# Patient Record
Sex: Female | Born: 1995 | Race: White | Hispanic: No | Marital: Married | Smoking: Never smoker
Health system: Southern US, Community
[De-identification: ages and names within clinical notes are randomized; demographics above are authoritative.]

## PROBLEM LIST (undated history)

## (undated) ENCOUNTER — Inpatient Hospital Stay (HOSPITAL_COMMUNITY): Payer: Self-pay

## (undated) DIAGNOSIS — N289 Disorder of kidney and ureter, unspecified: Secondary | ICD-10-CM

## (undated) HISTORY — PX: BLADDER SURGERY: SHX569

---
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
## 2013-02-27 ENCOUNTER — Emergency Department (HOSPITAL_COMMUNITY)
Admission: EM | Admit: 2013-02-27 | Discharge: 2013-02-27 | Disposition: A | Discharge status: Home / Self Care | Payer: Self-pay | Attending: Emergency Medicine | Admitting: Emergency Medicine

## 2013-02-27 ENCOUNTER — Encounter (HOSPITAL_COMMUNITY): Payer: Self-pay | Admitting: Emergency Medicine

## 2013-02-27 DIAGNOSIS — N12 Tubulo-interstitial nephritis, not specified as acute or chronic: Secondary | ICD-10-CM | POA: Insufficient documentation

## 2013-02-27 DIAGNOSIS — Z792 Long term (current) use of antibiotics: Secondary | ICD-10-CM | POA: Insufficient documentation

## 2013-02-27 DIAGNOSIS — Z3202 Encounter for pregnancy test, result negative: Secondary | ICD-10-CM | POA: Insufficient documentation

## 2013-02-27 DIAGNOSIS — Z87440 Personal history of urinary (tract) infections: Secondary | ICD-10-CM | POA: Insufficient documentation

## 2013-02-27 DIAGNOSIS — R1084 Generalized abdominal pain: Secondary | ICD-10-CM | POA: Insufficient documentation

## 2013-02-27 LAB — URINALYSIS, ROUTINE W REFLEX MICROSCOPIC
Bilirubin Urine: NEGATIVE
Glucose, UA: NEGATIVE mg/dL
Ketones, ur: NEGATIVE mg/dL
Nitrite: POSITIVE — AB
Protein, ur: 30 mg/dL — AB
Specific Gravity, Urine: 1.025 (ref 1.005–1.030)
Urobilinogen, UA: 1 mg/dL (ref 0.0–1.0)
pH: 6.5 (ref 5.0–8.0)

## 2013-02-27 LAB — URINE MICROSCOPIC-ADD ON

## 2013-02-27 LAB — PREGNANCY, URINE: Preg Test, Ur: NEGATIVE

## 2013-02-27 MED ORDER — OXYCODONE-ACETAMINOPHEN 5-325 MG PO TABS: 1.0000 | ORAL_TABLET | ORAL | Status: DC | PRN

## 2013-02-27 MED ORDER — SULFAMETHOXAZOLE-TRIMETHOPRIM 800-160 MG PO TABS: 1.0000 | ORAL_TABLET | Freq: Two times a day (BID) | ORAL | Status: DC

## 2013-02-27 MED ORDER — SULFAMETHOXAZOLE-TMP DS 800-160 MG PO TABS
1.0000 | ORAL_TABLET | Freq: Once | ORAL | Status: AC
  Administered 2013-02-27: 1 via ORAL
  Filled 2013-02-27: qty 1

## 2013-02-27 MED ORDER — OXYCODONE-ACETAMINOPHEN 5-325 MG PO TABS
1.0000 | ORAL_TABLET | Freq: Once | ORAL | Status: AC
  Administered 2013-02-27: 1 via ORAL
  Filled 2013-02-27: qty 1

## 2013-02-27 MED ORDER — SULFAMETHOXAZOLE-TRIMETHOPRIM 200-40 MG/5ML PO SUSP: 20.0000 mL | Freq: Once | ORAL | Status: DC

## 2013-02-27 NOTE — Discharge Instructions (Signed)

## 2013-02-27 NOTE — ED Provider Notes (Signed)
CSN: [id number]     Arrival date & time 02/27/13  1229 History  This chart was scribed for Raeford RazorStephen Norfleet Capers, MD by Quintella ReichertMatthew Underwood, ED scribe.  This patient was seen in room APA08/APA08 and the patient's care was started at 1:41 PM.   Chief Complaint  Patient presents with  . Back Pain    The history is provided by the patient. No language interpreter was used.    HPI Comments: Charlotte Smith is a 18 y.o. female who presents to the Emergency Department complaining of lower left-sided back pain that began 2-3 days ago, with associated dysuria.  Pt states her pain came on gradually when she was lying in bed.  She describes pain as sharp.  It is not worsened by anything.  She also complains of 2 days of burning with urination.  She denies fevers, chills, or nausea.  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   Past Medical History  Diagnosis Date  . Renal disorder     non-functioning left kidney    History reviewed. No pertinent past surgical history.  No family history on file.   History  Substance Use Topics  . Smoking status: Never Smoker   . Smokeless tobacco: Not on file  . Alcohol Use: No    OB History   Grav Para Term Preterm Abortions TAB SAB Ect Mult Living                  Review of Systems  All other systems reviewed and are negative.     Allergies  Review of patient's allergies indicates no known allergies.  Home Medications   Current Outpatient Rx  Name  Route  Sig  Dispense  Refill  . amoxicillin (AMOXIL) 500 MG capsule   Oral   Take 1 capsule (500 mg total) by mouth 3 (three) times daily.   30 capsule   0    BP 133/78  Pulse 82  Temp(Src) 97.9 F (36.6 C) (Oral)  Resp 18  Ht 5' 5.5" (1.664 m)  Wt 130 lb (58.968 kg)  BMI 21.30 kg/m2  SpO2 99%  LMP 02/06/2013  Physical Exam  Nursing note and vitals reviewed. Constitutional: She appears well-developed and well-nourished. No distress.   HENT:  Head: Normocephalic and atraumatic.  Eyes: Conjunctivae are normal. Right eye exhibits no discharge. Left eye exhibits no discharge.  Neck: Neck supple.  Cardiovascular: Normal rate, regular rhythm and normal heart sounds.  Exam reveals no gallop and no friction rub.   No murmur heard. Pulmonary/Chest: Effort normal and breath sounds normal. No respiratory distress.  Abdominal: Soft. She exhibits no distension. There is tenderness (mild, diffuse). There is CVA tenderness (left). There is no rebound and no guarding.  Musculoskeletal: She exhibits no edema and no tenderness.  Neurological: She is alert.  Skin: Skin is warm and dry.  Psychiatric: She has a normal mood and affect. Her behavior is normal. Thought content normal.    ED Course  Procedures (including critical care time)  DIAGNOSTIC STUDIES: Oxygen Saturation is 99% on room air, normal by my interpretation.    COORDINATION OF CARE: 1:44 PM-Discussed treatment plan which includes antibiotics and return if symptoms do not improve within 24-36 hours or if she develops new or worsening symptoms.  Pt expressed understanding and agreed with plan.      Labs Review Labs Reviewed  URINALYSIS, ROUTINE W REFLEX MICROSCOPIC - Abnormal; Notable for the  following:    Color, Urine AMBER (*)    APPearance CLOUDY (*)    Hgb urine dipstick SMALL (*)    Protein, ur 30 (*)    Nitrite POSITIVE (*)    Leukocytes, UA TRACE (*)    All other components within normal limits  URINE MICROSCOPIC-ADD ON - Abnormal; Notable for the following:    Bacteria, UA MANY (*)    All other components within normal limits  URINE CULTURE  PREGNANCY, URINE   Imaging Review No results found.  EKG Interpretation   None       MDM   1. Pyelonephritis    18 year old female with atraumatic left lower back pain. Urinary symptoms and urinalysis consistent with urinary tract infection. Sent for culture. Past history significant for single  functioning kidney secondary to vesicoureteral reflux. Patient is afebrile. Well appearing. No vomiting. I feel she is appropriate for outpatient treatment at this time. Strict return precautions were discussed. Outpatient followup otherwise.   I personally preformed the services scribed in my presence. The recorded information has been reviewed is accurate. Raeford Razor, MD.    Raeford Razor, MD 03/02/13 [phone]

## 2013-02-27 NOTE — ED Notes (Signed)
Pt c/o lower back pain that started on right side then moved to left side as well, burning with urination, denies any n/v, chills, has hx of only one kidney functioning

## 2013-03-02 LAB — URINE CULTURE

## 2013-07-02 ENCOUNTER — Encounter (HOSPITAL_COMMUNITY): Payer: Self-pay | Admitting: Emergency Medicine

## 2013-07-02 ENCOUNTER — Emergency Department (HOSPITAL_COMMUNITY)
Admission: EM | Admit: 2013-07-02 | Discharge: 2013-07-03 | Disposition: A | Discharge status: Home / Self Care | Payer: Self-pay | Attending: Emergency Medicine | Admitting: Emergency Medicine

## 2013-07-02 DIAGNOSIS — Z79899 Other long term (current) drug therapy: Secondary | ICD-10-CM | POA: Insufficient documentation

## 2013-07-02 DIAGNOSIS — R110 Nausea: Secondary | ICD-10-CM | POA: Insufficient documentation

## 2013-07-02 DIAGNOSIS — Z87448 Personal history of other diseases of urinary system: Secondary | ICD-10-CM | POA: Insufficient documentation

## 2013-07-02 DIAGNOSIS — Z3202 Encounter for pregnancy test, result negative: Secondary | ICD-10-CM | POA: Insufficient documentation

## 2013-07-02 DIAGNOSIS — R109 Unspecified abdominal pain: Secondary | ICD-10-CM | POA: Insufficient documentation

## 2013-07-02 LAB — POC URINE PREG, ED: Preg Test, Ur: NEGATIVE

## 2013-07-02 NOTE — ED Provider Notes (Signed)
CSN: [id number]     Arrival date & time 07/02/13  2315 History  This chart was scribed for Derwood KaplanAnkit Jizelle Conkey, MD by Shari HeritageAisha Amuda, ED Scribe. The patient was seen in room APA11/APA11. Patient's care was started at 11:54 PM.   Chief Complaint  Patient presents with  . Nausea    The history is provided by the patient. No language interpreter was used.    HPI Comments: Talbert ForestVictoria B McGehee is a 18 y.o. female who presents to the Emergency Department complaining of gradually worsening, intermittent nausea for the past 2 days. She states that she is not experiencing morning sickness and that nausea usually occurs at night. She also reports intermittent cramping in her left abdomen yesterday, but none today. She states that symptoms are unchanged with food intake. She has an appetite and has been able to tolerate food and fluids. She is currently having nausea. She denies associated vomiting, hematuria, dysuria, chest pain. Her LNMP was 05/28/13. She says that her menstrual cycles usually occur monthly and are regular. She states that she is having intercourse without using contraceptives, but she states that she is not trying to get pregnant. She is G0. Patient does not have a PCP.    Past Medical History  Diagnosis Date  . Renal disorder     non-functioning left kidney   Past Surgical History  Procedure Laterality Date  . Bladder surgery     No family history on file. History  Substance Use Topics  . Smoking status: Never Smoker   . Smokeless tobacco: Not on file  . Alcohol Use: No   OB History   Grav Para Term Preterm Abortions TAB SAB Ect Mult Living                 Review of Systems  Constitutional: Negative for fever and chills.  Gastrointestinal: Positive for nausea and abdominal pain. Negative for vomiting.  Genitourinary: Negative for dysuria and hematuria.     Allergies  Review of patient's allergies indicates no known allergies.  Home Medications   Prior to Admission  medications   Medication Sig Start Date End Date Taking? Authorizing Provider  amoxicillin (AMOXIL) 500 MG capsule Take 1 capsule (500 mg total) by mouth 3 (three) times daily. 07/06/12  Yes Donnetta HutchingBrian Cook, MD  oxyCODONE-acetaminophen (PERCOCET/ROXICET) 5-325 MG per tablet Take 1 tablet by mouth every 4 (four) hours as needed for severe pain. 02/27/13  Yes Raeford RazorStephen Kohut, MD  sulfamethoxazole-trimethoprim (SEPTRA DS) 800-160 MG per tablet Take 1 tablet by mouth 2 (two) times daily. 02/27/13  Yes Raeford RazorStephen Kohut, MD  promethazine (PHENERGAN) 25 MG tablet Take 1 tablet (25 mg total) by mouth every 6 (six) hours as needed for nausea. 07/03/13   Derwood KaplanAnkit Latisa Belay, MD   Triage Vitals: BP 129/76  Pulse 89  Temp(Src) 98.5 F (36.9 C) (Oral)  Resp 20  Ht 5\' 5"  (1.651 m)  Wt 130 lb (58.968 kg)  BMI 21.63 kg/m2  SpO2 100%  LMP 06/29/2013 Physical Exam  Nursing note and vitals reviewed. Constitutional: She is oriented to person, place, and time. She appears well-developed and well-nourished. No distress.  HENT:  Head: Normocephalic and atraumatic.  Eyes: Conjunctivae and EOM are normal. No scleral icterus.  Sclera clear bilaterally.  Neck: Normal range of motion. No tracheal deviation present.  Cardiovascular: Normal rate, regular rhythm and normal heart sounds.   No murmur heard. Bilateral pulses normal.  Pulmonary/Chest: Effort normal and breath sounds normal. No respiratory distress. She has no wheezes.  She has no rales.  Lungs clear to auscultation.  Abdominal: Soft. There is no tenderness. There is no rebound and no guarding.  Musculoskeletal: Normal range of motion.  Neurological: She is alert and oriented to person, place, and time.  Skin: Skin is warm and dry.  Psychiatric: She has a normal mood and affect. Her behavior is normal.    ED Course  Procedures (including critical care time) DIAGNOSTIC STUDIES: Oxygen Saturation is 100% on room air, normal by my interpretation.    COORDINATION OF  CARE: 11:59 PM- Patient informed of current plan for treatment and evaluation and agrees with plan at this time.     Labs Review Labs Reviewed  URINALYSIS, ROUTINE W REFLEX MICROSCOPIC - Abnormal; Notable for the following:    Hgb urine dipstick LARGE (*)    Urobilinogen, UA 2.0 (*)    All other components within normal limits  URINE MICROSCOPIC-ADD ON - Abnormal; Notable for the following:    Squamous Epithelial / LPF MANY (*)    Bacteria, UA FEW (*)    All other components within normal limits  HCG, QUANTITATIVE, PREGNANCY  POC URINE PREG, ED    Imaging Review No results found.   EKG Interpretation None      MDM   Final diagnoses:  Nausea alone   I personally performed the services described in this documentation, which was scribed in my presence. The recorded information has been reviewed and is accurate.  Pt with cc of nausea. LMP more than a month ago. Upreg is neg, so is serum preg,. Unsure what is causing her nausea - but no indication for any further emergent workup. Will d.c   Derwood KaplanAnkit Adley Mazurowski, MD 07/03/13 [phone]

## 2013-07-02 NOTE — ED Notes (Signed)
Pt reports nausea for the past 2 to 3 days that is progressively getting worse. Denies any vomiting

## 2013-07-03 LAB — URINE MICROSCOPIC-ADD ON

## 2013-07-03 LAB — URINALYSIS, ROUTINE W REFLEX MICROSCOPIC
BILIRUBIN URINE: NEGATIVE
Glucose, UA: NEGATIVE mg/dL
Ketones, ur: NEGATIVE mg/dL
Leukocytes, UA: NEGATIVE
Nitrite: NEGATIVE
PH: 6 (ref 5.0–8.0)
Protein, ur: NEGATIVE mg/dL
SPECIFIC GRAVITY, URINE: 1.01 (ref 1.005–1.030)
UROBILINOGEN UA: 2 mg/dL — AB (ref 0.0–1.0)

## 2013-07-03 LAB — HCG, QUANTITATIVE, PREGNANCY: hCG, Beta Chain, Quant, S: <1 m[IU]/mL (ref <5)

## 2013-07-03 MED ORDER — ONDANSETRON 4 MG PO TBDP
4.0000 mg | ORAL_TABLET | Freq: Once | ORAL | Status: AC
  Administered 2013-07-03: 4 mg via ORAL
  Filled 2013-07-03: qty 1

## 2013-07-03 MED ORDER — PROMETHAZINE HCL 25 MG PO TABS: 25.0000 mg | ORAL_TABLET | Freq: Four times a day (QID) | ORAL | Status: DC | PRN

## 2013-07-03 NOTE — Discharge Instructions (Signed)
We saw you in the ER for the nausea. All the results in the ER are normal. We are not sure what is causing your symptoms. The workup in the ER is not complete, and is limited to screening for life threatening and emergent conditions only, so please see a primary care doctor for further evaluation.  RESOURCE GUIDE  Chronic Pain Problems: Contact [facility]  [phone] Patients need to be referred by their primary care doctor.  Insufficient Money for Medicine: Contact United Way:  call "211."   No Primary Care Doctor: - Call Health Connect  [phone] - can help you locate a primary care doctor that  accepts your insurance, provides certain services, etc. - Physician Referral Service- [phone]  Agencies that provide inexpensive medical care: - [facility]  [phone] - [facility]  [phone] - [facility]  [phone] - Women's Clinic  [phone] - Planned Parenthood  [phone] [facility]  [phone]  Medicaid-accepting [facility] [place] Providers: - [facility]- [address], [address]  [phone], Mon-Fri 9am-7pm, Sat 9am-1pm - [facility]- [address], [address] [place]  [phone] - [facility]- [address], [address] [place]  [phone] [facility]- [address] [place] [address]  [phone] - Renaye RakersVeita Bland- [address], [address], [phone]  Only accepts [place] patients after they have their name  applied to their card  Self Pay (no insurance) in [place]: - Sickle Cell Patients: Dr Willey BladeEric Dean, [facility]  [address], [phone] - [facility]- [address]  [phone]       Redge Gainer-     [facility]- [address]       -     [facility]- see information above (Speak to [employer]am H if you do not have insurance)       -  [facility] [place]- [address],  [phone]       -  [facility]- [address] [place] [address], [phone]       -  Dr Julio Sickssei-Bonsu-  [address], [address], [place], [phone]       -  [facility] - [address], [phone]       -  [facility]- [address] [place] [address], [phone], also [address], [phone]       -    [facility]- [address], [phone], 1st & 3rd Saturday        every month, 10am-1pm  [facility] [address] [place], [place] [id number] [phone]  [facility] [address] [place], [place] [phone]  1) Find a Doctor and Pay Out of Pocket Although you won't have to find out who is covered by your insurance plan, it is a good idea to ask around and get recommendations. You will then need to call the office and see if the doctor you have chosen will accept you as a new patient and what types of options they offer for patients who are self-pay. Some doctors offer discounts or will set up payment plans for their patients who do not have insurance, but you will need to ask so you aren't surprised when you get to your appointment.  2) Contact Your Local Health Department Not all health departments have doctors that can see patients for sick visits, but many do, so it is worth a call to see if yours does. If you don't know where your local health department is, you can check in your phone book. The CDC also has a tool to help you locate your state's health department, and many state websites also have listings of all of their local health departments.  3) Find a Walk-in Clinic If your illness is not likely to be very severe or complicated, you may want to try a walk in clinic. These are popping up all over the country in pharmacies, drugstores, and shopping centers. They're usually staffed by nurse practitioners or physician assistants that have been trained to treat  common illnesses and complaints. They're usually fairly quick and inexpensive. However, if you have serious medical issues or chronic medical problems, these are probably not your best option  STD Testing - [facility] Department of [facility], STD Clinic, [address], [place], phone [phone] or [phone].  Monday - Friday, call for an appointment. [facility] Department of [employer], STD Clinic, [address], [place], phone [phone] or [phone].  Monday - Friday, call for an appointment.  Abuse/Neglect: [facility] Child Abuse Hotline 782-113-1223 [facility] Child Abuse Hotline [phone] (After Hours)  Emergency Shelter:  [facility] [phone]  Maternity Homes: - [facility] [place] [facility] [phone] - [facility] [phone]  MRSA Hotline #:   (773)515-7093  Dental Assistance If unable to pay or uninsured, contact:  [facility] to become qualified for the adult dental clinic.  Patients with Medicaid: [facility] [phone] [address], [phone] [phone]  If unable to pay, or uninsured, contact [employer] [phone] in [place], [phone] in [facility]) to become qualified for the adult dental clinic  [facility] [address] [place], [place] [address] [phone] [email or web address]  Other Proofreader Services: - [facility]- [address] [place], [place], [place], [address], [phone], Ext. [phone], 2nd and 4th Thursday of the month at 6:30am.  10 clients each day by appointment, can sometimes see walk-in patients if someone does not show for an appointment. [facility]- [address] [place], [place], [address], [phone] - [facility]- [address] [place], [place], [address], [phone] [facility] Health  Department- [phone] [facility] Health Department- [phone] [facility] [phone]         Nausea, Adult Nausea is the feeling that you have an upset stomach or have to vomit. Nausea by itself is not likely a serious concern, but it may be an early sign of more serious medical problems. As nausea gets worse, it can lead to vomiting. If vomiting develops, there is the risk of dehydration.  CAUSES   Viral infections.  Food poisoning.  Medicines.  Pregnancy.  Motion sickness.  Migraine headaches.  Emotional distress.  Severe pain from any source.  Alcohol intoxication. HOME CARE INSTRUCTIONS  Get plenty of rest.  Ask your caregiver about specific rehydration instructions.  Eat small amounts of food and sip liquids more often.  Take all medicines as told by your caregiver. SEEK MEDICAL CARE IF:  You have not improved after 2 days, or you get worse.  You have a  headache. SEEK IMMEDIATE MEDICAL CARE IF:   You have a fever.  You faint.  You keep vomiting or have blood in your vomit.  You are extremely weak or dehydrated.  You have dark or bloody stools.  You have severe chest or abdominal pain. MAKE SURE YOU:  Understand these instructions.  Will watch your condition.  Will get help right away if you are not doing well or get worse. Document Released: 02/14/2004 Document Revised: 10/01/2011 Document Reviewed: 09/18/2010 [facility] Patient Information 2014 [employer] [place].

## 2014-03-03 ENCOUNTER — Emergency Department (HOSPITAL_COMMUNITY)
Admission: EM | Admit: 2014-03-03 | Discharge: 2014-03-03 | Disposition: A | Discharge status: Home / Self Care | Payer: Medicaid Other | Attending: Emergency Medicine | Admitting: Emergency Medicine

## 2014-03-03 ENCOUNTER — Encounter (HOSPITAL_COMMUNITY): Payer: Self-pay

## 2014-03-03 DIAGNOSIS — R0600 Dyspnea, unspecified: Secondary | ICD-10-CM

## 2014-03-03 DIAGNOSIS — O9989 Other specified diseases and conditions complicating pregnancy, childbirth and the puerperium: Secondary | ICD-10-CM | POA: Insufficient documentation

## 2014-03-03 DIAGNOSIS — Z3490 Encounter for supervision of normal pregnancy, unspecified, unspecified trimester: Secondary | ICD-10-CM

## 2014-03-03 DIAGNOSIS — Z792 Long term (current) use of antibiotics: Secondary | ICD-10-CM | POA: Diagnosis not present

## 2014-03-03 DIAGNOSIS — Z3A00 Weeks of gestation of pregnancy not specified: Secondary | ICD-10-CM | POA: Insufficient documentation

## 2014-03-03 DIAGNOSIS — Z87448 Personal history of other diseases of urinary system: Secondary | ICD-10-CM | POA: Diagnosis not present

## 2014-03-03 LAB — URINALYSIS, ROUTINE W REFLEX MICROSCOPIC
BILIRUBIN URINE: NEGATIVE
Glucose, UA: NEGATIVE mg/dL
KETONES UR: NEGATIVE mg/dL
Leukocytes, UA: NEGATIVE
NITRITE: NEGATIVE
Protein, ur: NEGATIVE mg/dL
SPECIFIC GRAVITY, URINE: 1.01 (ref 1.005–1.030)
Urobilinogen, UA: 0.2 mg/dL (ref 0.0–1.0)
pH: 6 (ref 5.0–8.0)

## 2014-03-03 LAB — URINE MICROSCOPIC-ADD ON

## 2014-03-03 LAB — PREGNANCY, URINE: Preg Test, Ur: POSITIVE — AB

## 2014-03-03 NOTE — ED Notes (Signed)
Patient verbalizes understanding of discharge instructions,home care and follow up care. Patient ambulatory out of department at this time with spouse. 

## 2014-03-03 NOTE — ED Provider Notes (Signed)
CSN: [id number]     Arrival date & time 03/03/14  0145 History   First MD Initiated Contact with Patient 03/03/14 0200     Chief Complaint  Patient presents with  . Shortness of Breath     (Consider location/radiation/quality/duration/timing/severity/associated sxs/prior Treatment) HPI Comments: Patient is a 19 year old female who presents with complaints of shortness of breath. She states that this started this morning when she woke up. She feels as though she cannot catch her breath. She denies wheezing. She denies chest pain, cough, fevers, sputum production. She denies any recent exertional symptoms. She denies any swelling or pain in her legs.  Patient is a 19 y.o. female presenting with shortness of breath. The history is provided by the patient.  Shortness of Breath Severity:  Moderate Onset quality:  Sudden Duration:  1 day Timing:  Constant Progression:  Unchanged Chronicity:  New Relieved by:  Nothing Worsened by:  Nothing tried Ineffective treatments:  None tried Associated symptoms: no chest pain, no cough, no fever, no sore throat and no sputum production     Past Medical History  Diagnosis Date  . Renal disorder     non-functioning left kidney   Past Surgical History  Procedure Laterality Date  . Bladder surgery     No family history on file. History  Substance Use Topics  . Smoking status: Never Smoker   . Smokeless tobacco: Not on file  . Alcohol Use: No   OB History    No data available     Review of Systems  Constitutional: Negative for fever.  HENT: Negative for sore throat.   Respiratory: Positive for shortness of breath. Negative for cough and sputum production.   Cardiovascular: Negative for chest pain.  All other systems reviewed and are negative.     Allergies  Review of patient's allergies indicates no known allergies.  Home Medications   Prior to Admission medications   Medication Sig Start Date End Date Taking? Authorizing  Provider  amoxicillin (AMOXIL) 500 MG capsule Take 1 capsule (500 mg total) by mouth 3 (three) times daily. 07/06/12   Donnetta Hutching, MD  oxyCODONE-acetaminophen (PERCOCET/ROXICET) 5-325 MG per tablet Take 1 tablet by mouth every 4 (four) hours as needed for severe pain. 02/27/13   Raeford Razor, MD  promethazine (PHENERGAN) 25 MG tablet Take 1 tablet (25 mg total) by mouth every 6 (six) hours as needed for nausea. 07/03/13   Derwood Kaplan, MD  sulfamethoxazole-trimethoprim (SEPTRA DS) 800-160 MG per tablet Take 1 tablet by mouth 2 (two) times daily. 02/27/13   Raeford Razor, MD   BP 136/89 mmHg  Pulse 109  Temp(Src) 98.3 F (36.8 C) (Oral)  Resp 16  Ht 5' 5.5" (1.664 m)  Wt 140 lb (63.504 kg)  BMI 22.93 kg/m2  SpO2 100%  LMP 01/29/2014 Physical Exam  Constitutional: She is oriented to person, place, and time. She appears well-developed and well-nourished. No distress.  HENT:  Head: Normocephalic and atraumatic.  Neck: Normal range of motion. Neck supple.  Cardiovascular: Normal rate and regular rhythm.  Exam reveals no gallop and no friction rub.   No murmur heard. Pulmonary/Chest: Effort normal and breath sounds normal. No respiratory distress. She has no wheezes. She has no rales.  Abdominal: Soft. Bowel sounds are normal. She exhibits no distension. There is no tenderness.  Musculoskeletal: Normal range of motion. She exhibits no edema.  There is no swelling in the legs. There is no calf tenderness. Homans sign is absent bilaterally.  Neurological:  She is alert and oriented to person, place, and time.  Skin: Skin is warm and dry. She is not diaphoretic.  Nursing note and vitals reviewed.   ED Course  Procedures (including critical care time) Labs Review Labs Reviewed  PREGNANCY, URINE  URINALYSIS, ROUTINE W REFLEX MICROSCOPIC    Imaging Review No results found.   EKG Interpretation None      MDM   Final diagnoses:  None    Patient presents with complaints of  shortness of breath that started yesterday morning when she woke up. Her oxygen saturations are 100%, respiratory rate is 16, and her lungs are clear. While speaking with her, she informed me that she is late for her period and had a home pregnancy test which was positive. She would like to have a pregnancy test to confirm the results from home. This returned positive as well. She will be discharged with follow-up with OB. She appears in no respiratory distress and I do not feel as though further workup into her initially reported dyspnea is indicated. She is to return as needed if she worsens.    Geoffery Lyonsouglas Korey Arroyo, MD 03/03/14 [phone]

## 2014-03-03 NOTE — Discharge Instructions (Signed)
You need to follow-up with an OB/GYN to establish prenatal care. The contact information for Dr. Emelda FearFerguson has been provided in this discharge summary. You can call his office to arrange this appointment.  Return to the emergency department if your symptoms substantially worsen or change.   Shortness of Breath Shortness of breath means you have trouble breathing. It could also mean that you have a medical problem. You should get immediate medical care for shortness of breath. CAUSES   Not enough oxygen in the air such as with high altitudes or a smoke-filled room.  Certain lung diseases, infections, or problems.  Heart disease or conditions, such as angina or heart failure.  Low red blood cells (anemia).  Poor physical fitness, which can cause shortness of breath when you exercise.  Chest or back injuries or stiffness.  Being overweight.  Smoking.  Anxiety, which can make you feel like you are not getting enough air. DIAGNOSIS  Serious medical problems can often be found during your physical exam. Tests may also be done to determine why you are having shortness of breath. Tests may include:  Chest X-rays.  Lung function tests.  Blood tests.  An electrocardiogram (ECG).  An ambulatory electrocardiogram. An ambulatory ECG records your heartbeat patterns over a 24-hour period.  Exercise testing.  A transthoracic echocardiogram (TTE). During echocardiography, sound waves are used to evaluate how blood flows through your heart.  A transesophageal echocardiogram (TEE).  Imaging scans. Your health care provider may not be able to find a cause for your shortness of breath after your exam. In this case, it is important to have a follow-up exam with your health care provider as directed.  TREATMENT  Treatment for shortness of breath depends on the cause of your symptoms and can vary greatly. HOME CARE INSTRUCTIONS   Do not smoke. Smoking is a common cause of shortness of  breath. If you smoke, ask for help to quit.  Avoid being around chemicals or things that may bother your breathing, such as paint fumes and dust.  Rest as needed. Slowly resume your usual activities.  If medicines were prescribed, take them as directed for the full length of time directed. This includes oxygen and any inhaled medicines.  Keep all follow-up appointments as directed by your health care provider. SEEK MEDICAL CARE IF:   Your condition does not improve in the time expected.  You have a hard time doing your normal activities even with rest.  You have any new symptoms. SEEK IMMEDIATE MEDICAL CARE IF:   Your shortness of breath gets worse.  You feel light-headed, faint, or develop a cough not controlled with medicines.  You start coughing up blood.  You have pain with breathing.  You have chest pain or pain in your arms, shoulders, or abdomen.  You have a fever.  You are unable to walk up stairs or exercise the way you normally do. MAKE SURE YOU:  Understand these instructions.  Will watch your condition.  Will get help right away if you are not doing well or get worse. Document Released: 10/01/2000 Document Revised: 01/11/2013 Document Reviewed: 03/24/2011 [facility] Patient Information 2015 [place]. This information is not intended to replace advice given to you by your health care provider. Make sure you discuss any questions you have with your health care provider.  First Trimester of Pregnancy The first trimester of pregnancy is from week 1 until the end of week 12 (months 1 through 3). A week after a sperm fertilizes  an egg, the egg will implant on the wall of the uterus. This embryo will begin to develop into a baby. Genes from you and your partner are forming the baby. The female genes determine whether the baby is a boy or a girl. At 6-8 weeks, the eyes and face are formed, and the heartbeat can be seen on ultrasound. At the end of 12 weeks, all the  baby's organs are formed.  Now that you are pregnant, you will want to do everything you can to have a healthy baby. Two of the most important things are to get good prenatal care and to follow your health care provider's instructions. Prenatal care is all the medical care you receive before the baby's birth. This care will help prevent, find, and treat any problems during the pregnancy and childbirth. BODY CHANGES Your body goes through many changes during pregnancy. The changes vary from woman to woman.   You may gain or lose a couple of pounds at first.  You may feel sick to your stomach (nauseous) and throw up (vomit). If the vomiting is uncontrollable, call your health care provider.  You may tire easily.  You may develop headaches that can be relieved by medicines approved by your health care provider.  You may urinate more often. Painful urination may mean you have a bladder infection.  You may develop heartburn as a result of your pregnancy.  You may develop constipation because certain hormones are causing the muscles that push waste through your intestines to slow down.  You may develop hemorrhoids or swollen, bulging veins (varicose veins).  Your breasts may begin to grow larger and become tender. Your nipples may stick out more, and the tissue that surrounds them (areola) may become darker.  Your gums may bleed and may be sensitive to brushing and flossing.  Dark spots or blotches (chloasma, mask of pregnancy) may develop on your face. This will likely fade after the baby is born.  Your menstrual periods will stop.  You may have a loss of appetite.  You may develop cravings for certain kinds of food.  You may have changes in your emotions from day to day, such as being excited to be pregnant or being concerned that something may go wrong with the pregnancy and baby.  You may have more vivid and strange dreams.  You may have changes in your hair. These can include  thickening of your hair, rapid growth, and changes in texture. Some women also have hair loss during or after pregnancy, or hair that feels dry or thin. Your hair will most likely return to normal after your baby is born. WHAT TO EXPECT AT YOUR PRENATAL VISITS During a routine prenatal visit:  You will be weighed to make sure you and the baby are growing normally.  Your blood pressure will be taken.  Your abdomen will be measured to track your baby's growth.  The fetal heartbeat will be listened to starting around week 10 or 12 of your pregnancy.  Test results from any previous visits will be discussed. Your health care provider may ask you:  How you are feeling.  If you are feeling the baby move.  If you have had any abnormal symptoms, such as leaking fluid, bleeding, severe headaches, or abdominal cramping.  If you have any questions. Other tests that may be performed during your first trimester include:  Blood tests to find your blood type and to check for the presence of any previous infections. They  will also be used to check for low iron levels (anemia) and Rh antibodies. Later in the pregnancy, blood tests for diabetes will be done along with other tests if problems develop.  Urine tests to check for infections, diabetes, or protein in the urine.  An ultrasound to confirm the proper growth and development of the baby.  An amniocentesis to check for possible genetic problems.  Fetal screens for spina bifida and Down syndrome.  You may need other tests to make sure you and the baby are doing well. HOME CARE INSTRUCTIONS  Medicines  Follow your health care provider's instructions regarding medicine use. Specific medicines may be either safe or unsafe to take during pregnancy.  Take your prenatal vitamins as directed.  If you develop constipation, try taking a stool softener if your health care provider approves. Diet  Eat regular, well-balanced meals. Choose a variety  of foods, such as meat or vegetable-based protein, fish, milk and low-fat dairy products, vegetables, fruits, and whole grain breads and cereals. Your health care provider will help you determine the amount of weight gain that is right for you.  Avoid raw meat and uncooked cheese. These carry germs that can cause birth defects in the baby.  Eating four or five small meals rather than three large meals a day may help relieve nausea and vomiting. If you start to feel nauseous, eating a few soda crackers can be helpful. Drinking liquids between meals instead of during meals also seems to help nausea and vomiting.  If you develop constipation, eat more high-fiber foods, such as fresh vegetables or fruit and whole grains. Drink enough fluids to keep your urine clear or pale yellow. Activity and Exercise  Exercise only as directed by your health care provider. Exercising will help you:  Control your weight.  Stay in shape.  Be prepared for labor and delivery.  Experiencing pain or cramping in the lower abdomen or low back is a good sign that you should stop exercising. Check with your health care provider before continuing normal exercises.  Try to avoid standing for long periods of time. Move your legs often if you must stand in one place for a long time.  Avoid heavy lifting.  Wear low-heeled shoes, and practice good posture.  You may continue to have sex unless your health care provider directs you otherwise. Relief of Pain or Discomfort  Wear a good support bra for breast tenderness.   Take warm sitz baths to soothe any pain or discomfort caused by hemorrhoids. Use hemorrhoid cream if your health care provider approves.   Rest with your legs elevated if you have leg cramps or low back pain.  If you develop varicose veins in your legs, wear support hose. Elevate your feet for 15 minutes, 3-4 times a day. Limit salt in your diet. Prenatal Care  Schedule your prenatal visits by the  twelfth week of pregnancy. They are usually scheduled monthly at first, then more often in the last 2 months before delivery.  Write down your questions. Take them to your prenatal visits.  Keep all your prenatal visits as directed by your health care provider. Safety  Wear your seat belt at all times when driving.  Make a list of emergency phone numbers, including numbers for family, friends, the hospital, and police and fire departments. General Tips  Ask your health care provider for a referral to a local prenatal education class. Begin classes no later than at the beginning of month 6 of your  pregnancy.  Ask for help if you have counseling or nutritional needs during pregnancy. Your health care provider can offer advice or refer you to specialists for help with various needs.  Do not use hot tubs, steam rooms, or saunas.  Do not douche or use tampons or scented sanitary pads.  Do not cross your legs for long periods of time.  Avoid cat litter boxes and soil used by cats. These carry germs that can cause birth defects in the baby and possibly loss of the fetus by miscarriage or stillbirth.  Avoid all smoking, herbs, alcohol, and medicines not prescribed by your health care provider. Chemicals in these affect the formation and growth of the baby.  Schedule a dentist appointment. At home, brush your teeth with a soft toothbrush and be gentle when you floss. SEEK MEDICAL CARE IF:   You have dizziness.  You have mild pelvic cramps, pelvic pressure, or nagging pain in the abdominal area.  You have persistent nausea, vomiting, or diarrhea.  You have a bad smelling vaginal discharge.  You have pain with urination.  You notice increased swelling in your face, hands, legs, or ankles. SEEK IMMEDIATE MEDICAL CARE IF:   You have a fever.  You are leaking fluid from your vagina.  You have spotting or bleeding from your vagina.  You have severe abdominal cramping or pain.  You  have rapid weight gain or loss.  You vomit blood or material that looks like coffee grounds.  You are exposed to [place] measles and have never had them.  You are exposed to fifth disease or chickenpox.  You develop a severe headache.  You have shortness of breath.  You have any kind of trauma, such as from a fall or a car accident. Document Released: 12/31/2000 Document Revised: 05/23/2013 Document Reviewed: 11/16/2012 [facility] Patient Information 2015 [place], [place]. This information is not intended to replace advice given to you by your health care provider. Make sure you discuss any questions you have with your health care provider.

## 2014-03-03 NOTE — ED Notes (Signed)
Patient states she began to have shortness of breath that started yesterday at 0900. Patient denies chest pain and denies cough. Patient states she feels like she cannot catch her breath.

## 2014-03-12 ENCOUNTER — Encounter (HOSPITAL_COMMUNITY): Payer: Self-pay

## 2014-03-12 ENCOUNTER — Emergency Department (HOSPITAL_COMMUNITY)
Admission: EM | Admit: 2014-03-12 | Discharge: 2014-03-12 | Disposition: A | Discharge status: Home / Self Care | Payer: Medicaid Other | Attending: Emergency Medicine | Admitting: Emergency Medicine

## 2014-03-12 DIAGNOSIS — K219 Gastro-esophageal reflux disease without esophagitis: Secondary | ICD-10-CM | POA: Insufficient documentation

## 2014-03-12 DIAGNOSIS — Z87448 Personal history of other diseases of urinary system: Secondary | ICD-10-CM | POA: Diagnosis not present

## 2014-03-12 DIAGNOSIS — Z3A01 Less than 8 weeks gestation of pregnancy: Secondary | ICD-10-CM | POA: Insufficient documentation

## 2014-03-12 DIAGNOSIS — O210 Mild hyperemesis gravidarum: Secondary | ICD-10-CM

## 2014-03-12 DIAGNOSIS — O99611 Diseases of the digestive system complicating pregnancy, first trimester: Secondary | ICD-10-CM | POA: Insufficient documentation

## 2014-03-12 LAB — URINALYSIS, ROUTINE W REFLEX MICROSCOPIC
Bilirubin Urine: NEGATIVE
GLUCOSE, UA: NEGATIVE mg/dL
Ketones, ur: 40 mg/dL — AB
Leukocytes, UA: NEGATIVE
Nitrite: POSITIVE — AB
PROTEIN: NEGATIVE mg/dL
SPECIFIC GRAVITY, URINE: 1.01 (ref 1.005–1.030)
UROBILINOGEN UA: 0.2 mg/dL (ref 0.0–1.0)
pH: 6 (ref 5.0–8.0)

## 2014-03-12 LAB — CBC WITH DIFFERENTIAL/PLATELET
BASOS PCT: 0 % (ref 0–1)
Basophils Absolute: 0 10*3/uL (ref 0.0–0.1)
Eosinophils Absolute: 0.2 10*3/uL (ref 0.0–0.7)
Eosinophils Relative: 2 % (ref 0–5)
HCT: 36.8 % (ref 36.0–46.0)
Hemoglobin: 12.8 g/dL (ref 12.0–15.0)
LYMPHS PCT: 15 % (ref 12–46)
Lymphs Abs: 1.6 10*3/uL (ref 0.7–4.0)
MCH: 29.6 pg (ref 26.0–34.0)
MCHC: 34.8 g/dL (ref 30.0–36.0)
MCV: 85.2 fL (ref 78.0–100.0)
MONO ABS: 0.6 10*3/uL (ref 0.1–1.0)
MONOS PCT: 5 % (ref 3–12)
NEUTROS PCT: 78 % — AB (ref 43–77)
Neutro Abs: 8.6 10*3/uL — ABNORMAL HIGH (ref 1.7–7.7)
Platelets: 246 10*3/uL (ref 150–400)
RBC: 4.32 MIL/uL (ref 3.87–5.11)
RDW: 12 % (ref 11.5–15.5)
WBC: 11.1 10*3/uL — ABNORMAL HIGH (ref 4.0–10.5)

## 2014-03-12 LAB — LIPASE, BLOOD: LIPASE: 23 U/L (ref 11–59)

## 2014-03-12 LAB — COMPREHENSIVE METABOLIC PANEL
ALT: 14 U/L (ref 0–35)
AST: 16 U/L (ref 0–37)
Albumin: 4.8 g/dL (ref 3.5–5.2)
Alkaline Phosphatase: 40 U/L (ref 39–117)
Anion gap: 7 (ref 5–15)
BILIRUBIN TOTAL: 0.8 mg/dL (ref 0.3–1.2)
BUN: 13 mg/dL (ref 6–23)
CHLORIDE: 103 mmol/L (ref 96–112)
CO2: 25 mmol/L (ref 19–32)
Calcium: 9.4 mg/dL (ref 8.4–10.5)
Creatinine, Ser: 0.73 mg/dL (ref 0.50–1.10)
GFR calc Af Amer: >90 mL/min (ref >90)
GLUCOSE: 88 mg/dL (ref 70–99)
POTASSIUM: 3.4 mmol/L — AB (ref 3.5–5.1)
Sodium: 135 mmol/L (ref 135–145)
TOTAL PROTEIN: 7.8 g/dL (ref 6.0–8.3)

## 2014-03-12 LAB — URINE MICROSCOPIC-ADD ON

## 2014-03-12 LAB — HCG, QUANTITATIVE, PREGNANCY: hCG, Beta Chain, Quant, S: 74373 m[IU]/mL — ABNORMAL HIGH (ref <5)

## 2014-03-12 MED ORDER — SODIUM CHLORIDE 0.9 % IV BOLUS (SEPSIS)
1000.0000 mL | Freq: Once | INTRAVENOUS | Status: AC
  Administered 2014-03-12: 1000 mL via INTRAVENOUS

## 2014-03-12 MED ORDER — ONDANSETRON HCL 4 MG PO TABS: 4.0000 mg | ORAL_TABLET | Freq: Four times a day (QID) | ORAL | Status: DC

## 2014-03-12 MED ORDER — CEPHALEXIN 500 MG PO CAPS
500.0000 mg | ORAL_CAPSULE | Freq: Once | ORAL | Status: AC
  Administered 2014-03-12: 500 mg via ORAL
  Filled 2014-03-12: qty 1

## 2014-03-12 MED ORDER — RANITIDINE HCL 150 MG PO TABS: 150.0000 mg | ORAL_TABLET | Freq: Two times a day (BID) | ORAL | Status: DC

## 2014-03-12 MED ORDER — CEPHALEXIN 500 MG PO CAPS: 500.0000 mg | ORAL_CAPSULE | Freq: Four times a day (QID) | ORAL | Status: DC

## 2014-03-12 MED ORDER — ONDANSETRON HCL 4 MG/2ML IJ SOLN
4.0000 mg | Freq: Once | INTRAMUSCULAR | Status: AC
  Administered 2014-03-12: 4 mg via INTRAVENOUS
  Filled 2014-03-12: qty 2

## 2014-03-12 NOTE — ED Provider Notes (Signed)
CSN: [id number]     Arrival date & time 03/12/14  1953 History   First MD Initiated Contact with Patient 03/12/14 2001     Chief Complaint  Patient presents with  . Gastrophageal Reflux     (Consider location/radiation/quality/duration/timing/severity/associated sxs/prior Treatment) HPI Comments: Patient presents to the ER for evaluation of reflux with nausea. Patient reports that she was recently diagnosed with early pregnancy, she has approximate 5 or 6 weeks. She reports that for most of this week she has been having belching, discomfort in her abdomen and chest that she associates with reflux and nausea. She has not had any vomiting. There is no fever. She denies pelvic pain, vaginal bleeding, vaginal discharge. She has follow-up with Dr. Emelda Fear, OB/GYN scheduled for tomorrow.  Patient reports that her reflux symptoms have worsened today. She reports that she has now started "spitting up some blood".  Patient is a 19 y.o. female presenting with GERD.  Gastrophageal Reflux Associated symptoms include abdominal pain.    Past Medical History  Diagnosis Date  . Renal disorder     non-functioning left kidney   Past Surgical History  Procedure Laterality Date  . Bladder surgery     No family history on file. History  Substance Use Topics  . Smoking status: Never Smoker   . Smokeless tobacco: Not on file  . Alcohol Use: No   OB History    No data available     Review of Systems  Gastrointestinal: Positive for nausea and abdominal pain.  Genitourinary: Negative.   All other systems reviewed and are negative.     Allergies  Review of patient's allergies indicates no known allergies.  Home Medications   Prior to Admission medications   Medication Sig Start Date End Date Taking? Authorizing Provider  amoxicillin (AMOXIL) 500 MG capsule Take 1 capsule (500 mg total) by mouth 3 (three) times daily. Patient not taking: Reported on 03/12/2014 07/06/12   Donnetta Hutching, MD   oxyCODONE-acetaminophen (PERCOCET/ROXICET) 5-325 MG per tablet Take 1 tablet by mouth every 4 (four) hours as needed for severe pain. Patient not taking: Reported on 03/12/2014 02/27/13   Raeford Razor, MD  promethazine (PHENERGAN) 25 MG tablet Take 1 tablet (25 mg total) by mouth every 6 (six) hours as needed for nausea. Patient not taking: Reported on 03/12/2014 07/03/13   Derwood Kaplan, MD  sulfamethoxazole-trimethoprim (SEPTRA DS) 800-160 MG per tablet Take 1 tablet by mouth 2 (two) times daily. Patient not taking: Reported on 03/12/2014 02/27/13   Raeford Razor, MD   BP 128/77 mmHg  Pulse 88  Temp(Src) 98.1 F (36.7 C) (Oral)  Resp 24  Ht  (1.676 m)  Wt 143 lb (64.864 kg)  BMI 23.09 kg/m2  SpO2 100%  LMP 01/29/2014 Physical Exam  Constitutional: She is oriented to person, place, and time. She appears well-developed and well-nourished. No distress.  HENT:  Head: Normocephalic and atraumatic.  Right Ear: Hearing normal.  Left Ear: Hearing normal.  Nose: Nose normal.  Mouth/Throat: Oropharynx is clear and moist and mucous membranes are normal.  Eyes: Conjunctivae and EOM are normal. Pupils are equal, round, and reactive to light.  Neck: Normal range of motion. Neck supple.  Cardiovascular: Regular rhythm, S1 normal and S2 normal.  Exam reveals no gallop and no friction rub.   No murmur heard. Pulmonary/Chest: Effort normal and breath sounds normal. No respiratory distress. She exhibits no tenderness.  Abdominal: Soft. Normal appearance and bowel sounds are normal. There is no hepatosplenomegaly. There  is no tenderness. There is no rebound, no guarding, no tenderness at McBurney's point and negative Murphy's sign. No hernia.  Musculoskeletal: Normal range of motion.  Neurological: She is alert and oriented to person, place, and time. She has normal strength. No cranial nerve deficit or sensory deficit. Coordination normal. GCS eye subscore is 4. GCS verbal subscore is 5. GCS motor  subscore is 6.  Skin: Skin is warm, dry and intact. No rash noted. No cyanosis.  Psychiatric: She has a normal mood and affect. Her speech is normal and behavior is normal. Thought content normal.  Nursing note and vitals reviewed.   ED Course  Procedures (including critical care time) Labs Review Labs Reviewed  CBC WITH DIFFERENTIAL/PLATELET - Abnormal; Notable for the following:    WBC 11.1 (*)    Neutrophils Relative % 78 (*)    Neutro Abs 8.6 (*)    All other components within normal limits  COMPREHENSIVE METABOLIC PANEL - Abnormal; Notable for the following:    Potassium 3.4 (*)    All other components within normal limits  URINALYSIS, ROUTINE W REFLEX MICROSCOPIC - Abnormal; Notable for the following:    APPearance HAZY (*)    Hgb urine dipstick MODERATE (*)    Ketones, ur 40 (*)    Nitrite POSITIVE (*)    All other components within normal limits  HCG, QUANTITATIVE, PREGNANCY - Abnormal; Notable for the following:    hCG, Beta Chain, Quant, S [id number] (*)    All other components within normal limits  URINE MICROSCOPIC-ADD ON - Abnormal; Notable for the following:    Squamous Epithelial / LPF MANY (*)    Bacteria, UA MANY (*)    All other components within normal limits  LIPASE, BLOOD    Imaging Review No results found.   EKG Interpretation None      MDM   Final diagnoses:  None   morning sickness  GERD  uti  Patient presents to the ER for evaluation of "indigestion", nausea. Patient reports that she was recently diagnosed with early pregnancy. She has not had prenatal care yet. She has not experienced any abdominal pain, pelvic pain, bleeding, discharge. She is experiencing chest and upper abdominal discomfort associated with a burning sensation, increased belching. She has had nausea without significant vomiting, but she did have some emesis today and thought there might be blood in it. Her vital signs are stable. Hemoglobin is normal. She does not appear to be  in any significant distress. I do not have any concern for obstruction complication at this time. She was hydrated and medicated. Laboratory workup is essentially normal other than suggestion of infection on urine. Will be treated with Keflex. She'll be treated for nausea and acid reflux, has follow-up with OB/GYN scheduled tomorrow.    Gilda Creasehristopher J. Freedom Peddy, MD 03/12/14 2215

## 2014-03-12 NOTE — ED Notes (Signed)
I am having bad acid reflux for the past week. Started spitting up blood right before I came here. I have not been taking anything for the acid reflux per pt.

## 2014-03-13 ENCOUNTER — Encounter: Payer: Self-pay | Admitting: Women's Health

## 2014-03-13 ENCOUNTER — Ambulatory Visit (INDEPENDENT_AMBULATORY_CARE_PROVIDER_SITE_OTHER): Payer: Medicaid Other | Admitting: Women's Health

## 2014-03-13 VITALS — BP 122/60 | Ht 66.0 in | Wt 143.0 lb

## 2014-03-13 DIAGNOSIS — Z3490 Encounter for supervision of normal pregnancy, unspecified, unspecified trimester: Secondary | ICD-10-CM

## 2014-03-13 DIAGNOSIS — Z331 Pregnant state, incidental: Secondary | ICD-10-CM

## 2014-03-13 DIAGNOSIS — N189 Chronic kidney disease, unspecified: Secondary | ICD-10-CM | POA: Insufficient documentation

## 2014-03-13 DIAGNOSIS — O26831 Pregnancy related renal disease, first trimester: Secondary | ICD-10-CM

## 2014-03-13 DIAGNOSIS — O26839 Pregnancy related renal disease, unspecified trimester: Secondary | ICD-10-CM | POA: Insufficient documentation

## 2014-03-13 DIAGNOSIS — Z3200 Encounter for pregnancy test, result unknown: Secondary | ICD-10-CM

## 2014-03-13 DIAGNOSIS — N289 Disorder of kidney and ureter, unspecified: Secondary | ICD-10-CM | POA: Diagnosis not present

## 2014-03-13 DIAGNOSIS — Z3201 Encounter for pregnancy test, result positive: Secondary | ICD-10-CM

## 2014-03-13 DIAGNOSIS — O219 Vomiting of pregnancy, unspecified: Secondary | ICD-10-CM

## 2014-03-13 LAB — POCT URINE PREGNANCY: Preg Test, Ur: POSITIVE

## 2014-03-13 MED ORDER — CEPHALEXIN 500 MG PO CAPS: 500.0000 mg | ORAL_CAPSULE | Freq: Every day | ORAL | Status: DC

## 2014-03-13 MED ORDER — CONCEPT DHA 53.5-38-1 MG PO CAPS: 1.0000 | ORAL_CAPSULE | Freq: Every day | ORAL | Status: DC

## 2014-03-13 NOTE — Patient Instructions (Addendum)
Take 1 keflex every night to help prevent UTIs after you have finished the 4/day x 7 days  Nausea & Vomiting  Have saltine crackers or pretzels by your bed and eat a few bites before you raise your head out of bed in the morning  Eat small frequent meals throughout the day instead of large meals  Drink plenty of fluids throughout the day to stay hydrated, just don't drink a lot of fluids with your meals.  This can make your stomach fill up faster making you feel sick  Do not brush your teeth right after you eat  Products with real ginger are good for nausea, like ginger ale and ginger hard candy Make sure it says made with real ginger!  Sucking on sour candy like lemon heads is also good for nausea  If your prenatal vitamins make you nauseated, take them at night so you will sleep through the nausea  Sea Bands  If you feel like you need medicine for the nausea & vomiting please let us know  If you are unable to keep any fluids or food down please let us know   First Trimester of Pregnancy The first trimester of pregnancy is from week 1 until the end of week 12 (months 1 through 3). A week after a sperm fertilizes an egg, the egg will implant on the wall of the uterus. This embryo will begin to develop into a baby. Genes from you and your partner are forming the baby. The female genes determine whether the baby is a boy or a girl. At 6-8 weeks, the eyes and face are formed, and the heartbeat can be seen on ultrasound. At the end of 12 weeks, all the baby's organs are formed.  Now that you are pregnant, you will want to do everything you can to have a healthy baby. Two of the most important things are to get good prenatal care and to follow your health care provider's instructions. Prenatal care is all the medical care you receive before the baby's birth. This care will help prevent, find, and treat any problems during the pregnancy and childbirth. BODY CHANGES Your body goes through many  changes during pregnancy. The changes vary from woman to woman.   You may gain or lose a couple of pounds at first.  You may feel sick to your stomach (nauseous) and throw up (vomit). If the vomiting is uncontrollable, call your health care provider.  You may tire easily.  You may develop headaches that can be relieved by medicines approved by your health care provider.  You may urinate more often. Painful urination may mean you have a bladder infection.  You may develop heartburn as a result of your pregnancy.  You may develop constipation because certain hormones are causing the muscles that push waste through your intestines to slow down.  You may develop hemorrhoids or swollen, bulging veins (varicose veins).  Your breasts may begin to grow larger and become tender. Your nipples may stick out more, and the tissue that surrounds them (areola) may become darker.  Your gums may bleed and may be sensitive to brushing and flossing.  Dark spots or blotches (chloasma, mask of pregnancy) may develop on your face. This will likely fade after the baby is born.  Your menstrual periods will stop.  You may have a loss of appetite.  You may develop cravings for certain kinds of food.  You may have changes in your emotions from day to day, such  as being excited to be pregnant or being concerned that something may go wrong with the pregnancy and baby.  You may have more vivid and strange dreams.  You may have changes in your hair. These can include thickening of your hair, rapid growth, and changes in texture. Some women also have hair loss during or after pregnancy, or hair that feels dry or thin. Your hair will most likely return to normal after your baby is born. WHAT TO EXPECT AT YOUR PRENATAL VISITS During a routine prenatal visit:  You will be weighed to make sure you and the baby are growing normally.  Your blood pressure will be taken.  Your abdomen will be measured to track  your baby's growth.  The fetal heartbeat will be listened to starting around week 10 or 12 of your pregnancy.  Test results from any previous visits will be discussed. Your health care provider may ask you:  How you are feeling.  If you are feeling the baby move.  If you have had any abnormal symptoms, such as leaking fluid, bleeding, severe headaches, or abdominal cramping.  If you have any questions. Other tests that may be performed during your first trimester include:  Blood tests to find your blood type and to check for the presence of any previous infections. They will also be used to check for low iron levels (anemia) and Rh antibodies. Later in the pregnancy, blood tests for diabetes will be done along with other tests if problems develop.  Urine tests to check for infections, diabetes, or protein in the urine.  An ultrasound to confirm the proper growth and development of the baby.  An amniocentesis to check for possible genetic problems.  Fetal screens for spina bifida and Down syndrome.  You may need other tests to make sure you and the baby are doing well. HOME CARE INSTRUCTIONS  Medicines  Follow your health care provider's instructions regarding medicine use. Specific medicines may be either safe or unsafe to take during pregnancy.  Take your prenatal vitamins as directed.  If you develop constipation, try taking a stool softener if your health care provider approves. Diet  Eat regular, well-balanced meals. Choose a variety of foods, such as meat or vegetable-based protein, fish, milk and low-fat dairy products, vegetables, fruits, and whole grain breads and cereals. Your health care provider will help you determine the amount of weight gain that is right for you.  Avoid raw meat and uncooked cheese. These carry germs that can cause birth defects in the baby.  Eating four or five small meals rather than three large meals a day may help relieve nausea and  vomiting. If you start to feel nauseous, eating a few soda crackers can be helpful. Drinking liquids between meals instead of during meals also seems to help nausea and vomiting.  If you develop constipation, eat more high-fiber foods, such as fresh vegetables or fruit and whole grains. Drink enough fluids to keep your urine clear or pale yellow. Activity and Exercise  Exercise only as directed by your health care provider. Exercising will help you:  Control your weight.  Stay in shape.  Be prepared for labor and delivery.  Experiencing pain or cramping in the lower abdomen or low back is a good sign that you should stop exercising. Check with your health care provider before continuing normal exercises.  Try to avoid standing for long periods of time. Move your legs often if you must stand in one place for a  long time.  Avoid heavy lifting.  Wear low-heeled shoes, and practice good posture.  You may continue to have sex unless your health care provider directs you otherwise. Relief of Pain or Discomfort  Wear a good support bra for breast tenderness.   Take warm sitz baths to soothe any pain or discomfort caused by hemorrhoids. Use hemorrhoid cream if your health care provider approves.   Rest with your legs elevated if you have leg cramps or low back pain.  If you develop varicose veins in your legs, wear support hose. Elevate your feet for 15 minutes, 3-4 times a day. Limit salt in your diet. Prenatal Care  Schedule your prenatal visits by the twelfth week of pregnancy. They are usually scheduled monthly at first, then more often in the last 2 months before delivery.  Write down your questions. Take them to your prenatal visits.  Keep all your prenatal visits as directed by your health care provider. Safety  Wear your seat belt at all times when driving.  Make a list of emergency phone numbers, including numbers for family, friends, the hospital, and police and fire  departments. General Tips  Ask your health care provider for a referral to a local prenatal education class. Begin classes no later than at the beginning of month 6 of your pregnancy.  Ask for help if you have counseling or nutritional needs during pregnancy. Your health care provider can offer advice or refer you to specialists for help with various needs.  Do not use hot tubs, steam rooms, or saunas.  Do not douche or use tampons or scented sanitary pads.  Do not cross your legs for long periods of time.  Avoid cat litter boxes and soil used by cats. These carry germs that can cause birth defects in the baby and possibly loss of the fetus by miscarriage or stillbirth.  Avoid all smoking, herbs, alcohol, and medicines not prescribed by your health care provider. Chemicals in these affect the formation and growth of the baby.  Schedule a dentist appointment. At home, brush your teeth with a soft toothbrush and be gentle when you floss. SEEK MEDICAL CARE IF:   You have dizziness.  You have mild pelvic cramps, pelvic pressure, or nagging pain in the abdominal area.  You have persistent nausea, vomiting, or diarrhea.  You have a bad smelling vaginal discharge.  You have pain with urination.  You notice increased swelling in your face, hands, legs, or ankles. SEEK IMMEDIATE MEDICAL CARE IF:   You have a fever.  You are leaking fluid from your vagina.  You have spotting or bleeding from your vagina.  You have severe abdominal cramping or pain.  You have rapid weight gain or loss.  You vomit blood or material that looks like coffee grounds.  You are exposed to [place] measles and have never had them.  You are exposed to fifth disease or chickenpox.  You develop a severe headache.  You have shortness of breath.  You have any kind of trauma, such as from a fall or a car accident. Document Released: 12/31/2000 Document Revised: 05/23/2013 Document Reviewed:  11/16/2012 [facility] Patient Information 2015 [place], [place]. This information is not intended to replace advice given to you by your health care provider. Make sure you discuss any questions you have with your health care provider.

## 2014-03-13 NOTE — Progress Notes (Signed)
Patient ID: Charlotte Smith, female   DOB: [birth year], 19 y.o.   MRN: [id number]   [facility]  Patient name: Charlotte Smith MRN [id number]  Date of birth: [birth year]  CC & HPI:  Charlotte Smith is a 19 y.o. G1P0 Caucasian female at 3744w1d by certain LMP of 1/10, presenting today for f/u from ED visit yesterday for reflux and UTI. Hasn't picked up Keflex rx yet. She has a hx of degenerated Lt kidney d/t urinary reflux, had surgery at 19yo to place tube to block reflux to that kidney when voiding. Gets frequent, ~q month UTIs, is supposed to be seeing urology, but hasn't had insurance. + nausea. PNV are making her sick.   Pertinent History Reviewed:  Medical & Surgical Hx:   Past Medical History  Diagnosis Date  . Renal disorder     non-functioning left kidney   Past Surgical History  Procedure Laterality Date  . Bladder surgery     Medications: Reviewed & Updated - see associated section Social History: Reviewed -  reports that she has never smoked. She does not have any smokeless tobacco history on file.  Objective Findings:  Vitals: BP 122/60 mmHg  Ht 5\' 6"  (1.676 m)  Wt 143 lb (64.864 kg)  BMI 23.09 kg/m2  LMP 01/29/2014  Physical Examination: General appearance - alert, well appearing, and in no distress Mental status - alert, oriented to person, place, and time Chest - clear to auscultation, no wheezes, rales or rhonchi, symmetric air entry Heart - normal rate, regular rhythm, normal S1, S2, no murmurs, rubs, clicks or gallops Abdomen - soft, nontender, nondistended, no masses or organomegaly Extremities - no edema Skin - normal coloration and turgor, no rashes, no suspicious skin lesions noted  Results for orders placed or performed in visit on 03/13/14 (from the past 24 hour(s))  POCT urine pregnancy   Collection Time: 03/13/14  8:43 AM  Result Value Ref Range   Preg Test, Ur Positive   Results for orders placed or performed during the hospital  encounter of 03/12/14 (from the past 24 hour(s))  CBC with Differential/Platelet   Collection Time: 03/12/14  8:20 PM  Result Value Ref Range   WBC 11.1 (H) 4.0 - 10.5 K/uL   RBC 4.32 3.87 - 5.11 MIL/uL   Hemoglobin 12.8 12.0 - 15.0 g/dL   HCT 56.236.8 13.036.0 - 86.546.0 %   MCV 85.2 78.0 - 100.0 fL   MCH 29.6 26.0 - 34.0 pg   MCHC 34.8 30.0 - 36.0 g/dL   RDW 78.412.0 69.611.5 - 29.515.5 %   Platelets 246 150 - 400 K/uL   Neutrophils Relative % 78 (H) 43 - 77 %   Neutro Abs 8.6 (H) 1.7 - 7.7 K/uL   Lymphocytes Relative 15 12 - 46 %   Lymphs Abs 1.6 0.7 - 4.0 K/uL   Monocytes Relative 5 3 - 12 %   Monocytes Absolute 0.6 0.1 - 1.0 K/uL   Eosinophils Relative 2 0 - 5 %   Eosinophils Absolute 0.2 0.0 - 0.7 K/uL   Basophils Relative 0 0 - 1 %   Basophils Absolute 0.0 0.0 - 0.1 K/uL  Comprehensive metabolic panel   Collection Time: 03/12/14  8:20 PM  Result Value Ref Range   Sodium 135 135 - 145 mmol/L   Potassium 3.4 (L) 3.5 - 5.1 mmol/L   Chloride 103 96 - 112 mmol/L   CO2 25 19 - 32 mmol/L   Glucose, Bld 88  70 - 99 mg/dL   BUN 13 6 - 23 mg/dL   Creatinine, Ser 1.61 0.50 - 1.10 mg/dL   Calcium 9.4 8.4 - 09.6 mg/dL   Total Protein 7.8 6.0 - 8.3 g/dL   Albumin 4.8 3.5 - 5.2 g/dL   AST 16 0 - 37 U/L   ALT 14 0 - 35 U/L   Alkaline Phosphatase 40 39 - 117 U/L   Total Bilirubin 0.8 0.3 - 1.2 mg/dL   GFR calc non Af Amer >90 >90 mL/min   GFR calc Af Amer >90 >90 mL/min   Anion gap 7 5 - 15  Lipase, blood   Collection Time: 03/12/14  8:20 PM  Result Value Ref Range   Lipase 23 11 - 59 U/L  hCG, quantitative, pregnancy   Collection Time: 03/12/14  8:20 PM  Result Value Ref Range   hCG, Beta Chain, Quant, S 74373 (H) <5 mIU/mL  Urinalysis, Routine w reflex microscopic   Collection Time: 03/12/14  9:39 PM  Result Value Ref Range   Color, Urine YELLOW YELLOW   APPearance HAZY (A) CLEAR   Specific Gravity, Urine 1.010 1.005 - 1.030   pH 6.0 5.0 - 8.0   Glucose, UA NEGATIVE NEGATIVE mg/dL   Hgb  urine dipstick MODERATE (A) NEGATIVE   Bilirubin Urine NEGATIVE NEGATIVE   Ketones, ur 40 (A) NEGATIVE mg/dL   Protein, ur NEGATIVE NEGATIVE mg/dL   Urobilinogen, UA 0.2 0.0 - 1.0 mg/dL   Nitrite POSITIVE (A) NEGATIVE   Leukocytes, UA NEGATIVE NEGATIVE  Urine microscopic-add on   Collection Time: 03/12/14  9:39 PM  Result Value Ref Range   Squamous Epithelial / LPF MANY (A) RARE   WBC, UA 0-2 <3 WBC/hpf   RBC / HPF 3-6 <3 RBC/hpf   Bacteria, UA MANY (A) RARE     Assessment & Plan:  A:   G1P0  [redacted]w[redacted]d  Renal disease in pregnancy w/ frequent UTIs  Current UTI   N/V of pregnancy  Reflux P:  Pick up keflex rx and start taking today  Rx keflex q hs for suppression after she finishes uti tx  Gave 3 diclegis samples, to call when gets [facility] # so can rx diclegis  Gave verification of pregnancy letter  Rx concept dha pnv to try, if still nauseated can try otc gummies   F/U 1wk for new ob u/s and new ob visit, will need referral to urology and baseline kidney function labs then   Marge Duncans CNM, [facility] 03/13/2014 9:11 AM

## 2014-03-15 LAB — URINE CULTURE: Colony Count: >=100000

## 2014-03-16 ENCOUNTER — Telehealth (HOSPITAL_COMMUNITY): Payer: Self-pay

## 2014-03-16 NOTE — ED Notes (Signed)
Post ED Visit - Positive Culture Follow-up  Culture report reviewed by antimicrobial stewardship pharmacist: []  Wes Dulaney, Pharm.D., BCPS [x]  Celedonio MiyamotoJeremy Frens, Pharm.D., BCPS []  Georgina PillionElizabeth Martin, Pharm.D., BCPS []  [place]elloMinh Pham, [address]Ph[address]D., BCPS, AAHIVP []  Estella HuskMichelle Turner, Pharm.D., BCPS, AAHIVP []  Elder CyphersLorie Poole, [address]Pharm[address]D., BCPS  Positive urine culture Treated with cephalexin, organism sensitive to the same and no further patient follow-up is required at this time.  Ashley JacobsFesterman, Ja Ohman C 03/16/2014, 11:19 AM

## 2014-03-21 ENCOUNTER — Other Ambulatory Visit: Payer: Self-pay | Admitting: Obstetrics & Gynecology

## 2014-03-21 DIAGNOSIS — O3680X0 Pregnancy with inconclusive fetal viability, not applicable or unspecified: Secondary | ICD-10-CM

## 2014-03-22 ENCOUNTER — Encounter (HOSPITAL_COMMUNITY): Payer: Self-pay | Admitting: Emergency Medicine

## 2014-03-22 ENCOUNTER — Emergency Department (HOSPITAL_COMMUNITY)
Admission: EM | Admit: 2014-03-22 | Discharge: 2014-03-22 | Disposition: A | Discharge status: Home / Self Care | Payer: Medicaid Other | Attending: Emergency Medicine | Admitting: Emergency Medicine

## 2014-03-22 DIAGNOSIS — Z79899 Other long term (current) drug therapy: Secondary | ICD-10-CM | POA: Insufficient documentation

## 2014-03-22 DIAGNOSIS — Z792 Long term (current) use of antibiotics: Secondary | ICD-10-CM | POA: Insufficient documentation

## 2014-03-22 DIAGNOSIS — O210 Mild hyperemesis gravidarum: Secondary | ICD-10-CM | POA: Insufficient documentation

## 2014-03-22 DIAGNOSIS — O219 Vomiting of pregnancy, unspecified: Secondary | ICD-10-CM

## 2014-03-22 DIAGNOSIS — Z3A01 Less than 8 weeks gestation of pregnancy: Secondary | ICD-10-CM | POA: Insufficient documentation

## 2014-03-22 LAB — CBC WITH DIFFERENTIAL/PLATELET
BASOS ABS: 0 10*3/uL (ref 0.0–0.1)
Basophils Relative: 0 % (ref 0–1)
EOS ABS: 0.1 10*3/uL (ref 0.0–0.7)
EOS PCT: 1 % (ref 0–5)
HCT: 37.8 % (ref 36.0–46.0)
Hemoglobin: 13.4 g/dL (ref 12.0–15.0)
Lymphocytes Relative: 12 % (ref 12–46)
Lymphs Abs: 1.6 10*3/uL (ref 0.7–4.0)
MCH: 30.2 pg (ref 26.0–34.0)
MCHC: 35.4 g/dL (ref 30.0–36.0)
MCV: 85.3 fL (ref 78.0–100.0)
Monocytes Absolute: 0.9 10*3/uL (ref 0.1–1.0)
Monocytes Relative: 6 % (ref 3–12)
NEUTROS PCT: 81 % — AB (ref 43–77)
Neutro Abs: 11.6 10*3/uL — ABNORMAL HIGH (ref 1.7–7.7)
PLATELETS: 287 10*3/uL (ref 150–400)
RBC: 4.43 MIL/uL (ref 3.87–5.11)
RDW: 11.9 % (ref 11.5–15.5)
WBC: 14.2 10*3/uL — ABNORMAL HIGH (ref 4.0–10.5)

## 2014-03-22 LAB — COMPREHENSIVE METABOLIC PANEL
ALT: 14 U/L (ref 0–35)
AST: 18 U/L (ref 0–37)
Albumin: 4.9 g/dL (ref 3.5–5.2)
Alkaline Phosphatase: 44 U/L (ref 39–117)
Anion gap: 10 (ref 5–15)
BUN: 8 mg/dL (ref 6–23)
CALCIUM: 9.7 mg/dL (ref 8.4–10.5)
CO2: 25 mmol/L (ref 19–32)
Chloride: 101 mmol/L (ref 96–112)
Creatinine, Ser: 0.74 mg/dL (ref 0.50–1.10)
GFR calc non Af Amer: >90 mL/min (ref >90)
GLUCOSE: 90 mg/dL (ref 70–99)
Potassium: 3.3 mmol/L — ABNORMAL LOW (ref 3.5–5.1)
Sodium: 136 mmol/L (ref 135–145)
TOTAL PROTEIN: 8.3 g/dL (ref 6.0–8.3)
Total Bilirubin: 0.5 mg/dL (ref 0.3–1.2)

## 2014-03-22 LAB — URINALYSIS, ROUTINE W REFLEX MICROSCOPIC
BILIRUBIN URINE: NEGATIVE
Glucose, UA: NEGATIVE mg/dL
KETONES UR: 40 mg/dL — AB
Leukocytes, UA: NEGATIVE
Nitrite: NEGATIVE
PH: 5.5 (ref 5.0–8.0)
Protein, ur: 30 mg/dL — AB
SPECIFIC GRAVITY, URINE: 1.025 (ref 1.005–1.030)
Urobilinogen, UA: 0.2 mg/dL (ref 0.0–1.0)

## 2014-03-22 LAB — URINE MICROSCOPIC-ADD ON

## 2014-03-22 LAB — HCG, QUANTITATIVE, PREGNANCY: HCG, BETA CHAIN, QUANT, S: 88932 m[IU]/mL — AB (ref <5)

## 2014-03-22 LAB — LIPASE, BLOOD: Lipase: 25 U/L (ref 11–59)

## 2014-03-22 MED ORDER — PROMETHAZINE HCL 25 MG/ML IJ SOLN
12.5000 mg | Freq: Once | INTRAMUSCULAR | Status: AC
  Administered 2014-03-22: 12.5 mg via INTRAMUSCULAR
  Filled 2014-03-22: qty 1

## 2014-03-22 MED ORDER — POTASSIUM CHLORIDE CRYS ER 20 MEQ PO TBCR
40.0000 meq | EXTENDED_RELEASE_TABLET | Freq: Once | ORAL | Status: AC
  Administered 2014-03-22: 40 meq via ORAL
  Filled 2014-03-22: qty 2

## 2014-03-22 MED ORDER — PROMETHAZINE HCL 25 MG/ML IJ SOLN
12.5000 mg | Freq: Once | INTRAMUSCULAR | Status: AC
  Administered 2014-03-22: 12.5 mg via INTRAVENOUS
  Filled 2014-03-22: qty 1

## 2014-03-22 MED ORDER — SODIUM CHLORIDE 0.9 % IV BOLUS (SEPSIS)
1000.0000 mL | Freq: Once | INTRAVENOUS | Status: AC
  Administered 2014-03-22: 1000 mL via INTRAVENOUS

## 2014-03-22 MED ORDER — METOCLOPRAMIDE HCL 10 MG PO TABS: 10.0000 mg | ORAL_TABLET | Freq: Three times a day (TID) | ORAL | Status: DC | PRN

## 2014-03-22 NOTE — ED Notes (Signed)
Pt alert & oriented x4, stable gait. Patient given discharge instructions, paperwork & prescription(s). Patient  instructed to stop at the registration desk to finish any additional paperwork. Patient verbalized understanding. Pt left department w/ no further questions. 

## 2014-03-22 NOTE — ED Provider Notes (Signed)
CSN: [id number]     Arrival date & time 03/22/14  1745 History   First MD Initiated Contact with Patient 03/22/14 1840     Chief Complaint  Patient presents with  . Emesis During Pregnancy     HPI  Pt was seen at 1850. Per pt, c/o gradual onset and persistence of multiple intermittent episodes of N/V that began yesterday. States she was evaluated by her OB/GYN last week for same, and rx diclegis. States she has not been taking it "because it makes the N/V worse." States she was dx with a UTI last week, rx keflex; endorses she is has been taking this as prescribed. Denies pelvic pain, no vaginal bleeding/discharge, no abd pain, no CP/SOB, no back pain, no fevers, no black or blood in stools or emesis. Hx G1P0, LMP 01/29/14, EGA 7 and 3/7 weeks.    Past Medical History  Diagnosis Date  . Renal disorder     non-functioning left kidney   Past Surgical History  Procedure Laterality Date  . Bladder surgery      History  Substance Use Topics  . Smoking status: Never Smoker   . Smokeless tobacco: Not on file  . Alcohol Use: No   OB History    Gravida Para Term Preterm AB TAB SAB Ectopic Multiple Living   2              Review of Systems ROS: Statement: All systems negative except as marked or noted in the HPI; Constitutional: Negative for fever and chills. ; ; Eyes: Negative for eye pain, redness and discharge. ; ; ENMT: Negative for ear pain, hoarseness, nasal congestion, sinus pressure and sore throat. ; ; Cardiovascular: Negative for chest pain, palpitations, diaphoresis, dyspnea and peripheral edema. ; ; Respiratory: Negative for cough, wheezing and stridor. ; ; Gastrointestinal: +N/V. Negative for diarrhea, abdominal pain, blood in stool, hematemesis, jaundice and rectal bleeding. . ; ; Genitourinary: Negative for dysuria, flank pain and hematuria. ; ; GYN:  No vaginal bleeding, no vaginal discharge, no vulvar pain. ;; Musculoskeletal: Negative for back pain and neck pain. Negative for  swelling and trauma.; ; Skin: Negative for pruritus, rash, abrasions, blisters, bruising and skin lesion.; ; Neuro: Negative for headache, lightheadedness and neck stiffness. Negative for weakness, altered level of consciousness , altered mental status, extremity weakness, paresthesias, involuntary movement, seizure and syncope.     Allergies  Review of patient's allergies indicates no known allergies.  Home Medications   Prior to Admission medications   Medication Sig Start Date End Date Taking? Authorizing Provider  cephALEXin (KEFLEX) 500 MG capsule Take 1 capsule (500 mg total) by mouth at bedtime. X 7 days 03/13/14  Yes Cheron Every Booker, CNM  ondansetron (ZOFRAN) 4 MG tablet Take 1 tablet (4 mg total) by mouth every 6 (six) hours. 03/12/14  Yes Gilda Crease, MD  Prenat-FeFum-FePo-FA-Omega 3 (CONCEPT DHA) 53.5-38-1 MG CAPS Take 1 capsule by mouth daily. 03/13/14  Yes Marge Duncans, CNM  ranitidine (ZANTAC) 150 MG tablet Take 1 tablet (150 mg total) by mouth 2 (two) times daily. 03/12/14  Yes Gilda Crease, MD  cephALEXin (KEFLEX) 500 MG capsule Take 1 capsule (500 mg total) by mouth 4 (four) times daily. Patient not taking: Reported on 03/13/2014 03/12/14   Gilda Crease, MD   BP 128/77 mmHg  Pulse 95  Temp(Src) 97.8 F (36.6 C) (Oral)  Resp 20  Ht  (1.676 m)  Wt 140 lb (63.504 kg)  BMI  22.61 kg/m2  SpO2 100%  LMP 01/29/2014 Physical Exam  1855; Physical examination:  Nursing notes reviewed; Vital signs and O2 SAT reviewed;  Constitutional: Well developed, Well nourished, Well hydrated, In no acute distress; Head:  Normocephalic, atraumatic; Eyes: EOMI, PERRL, No scleral icterus; ENMT: Mouth and pharynx normal, Mucous membranes moist; Neck: Supple, Full range of motion, No lymphadenopathy; Cardiovascular: Regular rate and rhythm, No murmur, rub, or gallop; Respiratory: Breath sounds clear & equal bilaterally, No rales, rhonchi, wheezes.   Speaking full sentences with ease, Normal respiratory effort/excursion; Chest: Nontender, Movement normal; Abdomen: Soft, Nontender, Nondistended, Normal bowel sounds; Genitourinary: No CVA tenderness; Extremities: Pulses normal, No tenderness, No edema, No calf edema or asymmetry.; Neuro: AA&Ox3, Major CN grossly intact.  Speech clear. No gross focal motor or sensory deficits in extremities.; Skin: Color normal, Warm, Dry.    ED Course  Procedures     EKG Interpretation None      MDM  MDM Reviewed: previous chart, nursing note and vitals Reviewed previous: labs Interpretation: labs     Results for orders placed or performed during the hospital encounter of 03/22/14  Urinalysis, Routine w reflex microscopic  Result Value Ref Range   Color, Urine YELLOW YELLOW   APPearance CLEAR CLEAR   Specific Gravity, Urine 1.025 1.005 - 1.030   pH 5.5 5.0 - 8.0   Glucose, UA NEGATIVE NEGATIVE mg/dL   Hgb urine dipstick MODERATE (A) NEGATIVE   Bilirubin Urine NEGATIVE NEGATIVE   Ketones, ur 40 (A) NEGATIVE mg/dL   Protein, ur 30 (A) NEGATIVE mg/dL   Urobilinogen, UA 0.2 0.0 - 1.0 mg/dL   Nitrite NEGATIVE NEGATIVE   Leukocytes, UA NEGATIVE NEGATIVE  Comprehensive metabolic panel  Result Value Ref Range   Sodium 136 135 - 145 mmol/L   Potassium 3.3 (L) 3.5 - 5.1 mmol/L   Chloride 101 96 - 112 mmol/L   CO2 25 19 - 32 mmol/L   Glucose, Bld 90 70 - 99 mg/dL   BUN 8 6 - 23 mg/dL   Creatinine, Ser 4.780.74 0.50 - 1.10 mg/dL   Calcium 9.7 8.4 - 29.510.5 mg/dL   Total Protein 8.3 6.0 - 8.3 g/dL   Albumin 4.9 3.5 - 5.2 g/dL   AST 18 0 - 37 U/L   ALT 14 0 - 35 U/L   Alkaline Phosphatase 44 39 - 117 U/L   Total Bilirubin 0.5 0.3 - 1.2 mg/dL   GFR calc non Af Amer >90 >90 mL/min   GFR calc Af Amer >90 >90 mL/min   Anion gap 10 5 - 15  Lipase, blood  Result Value Ref Range   Lipase 25 11 - 59 U/L  CBC with Differential  Result Value Ref Range   WBC 14.2 (H) 4.0 - 10.5 K/uL   RBC 4.43 3.87 -  5.11 MIL/uL   Hemoglobin 13.4 12.0 - 15.0 g/dL   HCT 62.137.8 30.836.0 - 65.746.0 %   MCV 85.3 78.0 - 100.0 fL   MCH 30.2 26.0 - 34.0 pg   MCHC 35.4 30.0 - 36.0 g/dL   RDW 84.611.9 96.211.5 - 95.215.5 %   Platelets 287 150 - 400 K/uL   Neutrophils Relative % 81 (H) 43 - 77 %   Neutro Abs 11.6 (H) 1.7 - 7.7 K/uL   Lymphocytes Relative 12 12 - 46 %   Lymphs Abs 1.6 0.7 - 4.0 K/uL   Monocytes Relative 6 3 - 12 %   Monocytes Absolute 0.9 0.1 - 1.0 K/uL  Eosinophils Relative 1 0 - 5 %   Eosinophils Absolute 0.1 0.0 - 0.7 K/uL   Basophils Relative 0 0 - 1 %   Basophils Absolute 0.0 0.0 - 0.1 K/uL  hCG, quantitative, pregnancy  Result Value Ref Range   hCG, Beta Chain, Quant, S 16109 (H) <5 mIU/mL  Urine microscopic-add on  Result Value Ref Range   Squamous Epithelial / LPF FEW (A) RARE   WBC, UA 0-2 <3 WBC/hpf   RBC / HPF 3-6 <3 RBC/hpf   Bacteria, UA FEW (A) RARE    2100:  Pt has tol PO well while in the ED without N/V.  No stooling while in the ED.  Abd remains benign, VSS. Feels better and wants to go home now. Dx and testing d/w pt and family.  Questions answered.  Verb understanding, agreeable to d/c home with outpt f/u.      Samuel Jester, DO 03/24/14 2343

## 2014-03-22 NOTE — Discharge Instructions (Signed)
°Emergency Department Resource Guide °1) Find a Doctor and Pay Out of Pocket °Although you won't have to find out who is covered by your insurance plan, it is a good idea to ask around and get recommendations. You will then need to call the office and see if the doctor you have chosen will accept you as a new patient and what types of options they offer for patients who are self-pay. Some doctors offer discounts or will set up payment plans for their patients who do not have insurance, but you will need to ask so you aren't surprised when you get to your appointment. ° °2) Contact Your Local Health Department °Not all health departments have doctors that can see patients for sick visits, but many do, so it is worth a call to see if yours does. If you don't know where your local health department is, you can check in your phone book. The CDC also has a tool to help you locate your state's health department, and many state websites also have listings of all of their local health departments. ° °3) Find a Walk-in Clinic °If your illness is not likely to be very severe or complicated, you may want to try a walk in clinic. These are popping up all over the country in pharmacies, drugstores, and shopping centers. They're usually staffed by nurse practitioners or physician assistants that have been trained to treat common illnesses and complaints. They're usually fairly quick and inexpensive. However, if you have serious medical issues or chronic medical problems, these are probably not your best option. ° °No Primary Care Doctor: °- Call [facility] at  832-8000 - they can help you locate a primary care doctor that  accepts your insurance, provides certain services, etc. °- Physician Referral Service- 1-800-533-3463 ° °Chronic Pain Problems: °Organization         Address  Phone   Notes  °[facility]  (336) 297-2271 Patients need to be referred by their primary care doctor.  ° °Medication  Assistance: °Organization         Address  Phone   Notes  °[place] [facility] [address] °[place], [place] [address] (336) 641-8030 --Must be a resident of [place] °-- Must have NO insurance coverage whatsoever (no Medicaid/ Medicare, etc.) °-- The pt. MUST have a primary care doctor that directs their care regularly and follows them in the community °  °[facility]  (866) 331-1348   °[employer]  (888) 892-1162   ° °Agencies that provide inexpensive medical care: °Organization         Address  Phone   Notes  °[facility]  (336) 832-8035   °[facility]    (336) 832-7272   °[facility] [address] °[place], [place] [address] (336) 832-4777   °[facility] [address], °[place] (336) 271-4999   °[facility]    (336) 373-0678   °[facility]    (336) 272-1050   °[facility] ° [address], [place] Phone:  (336) 832-4444, Fax:  (336) 832-4440 Hours of Operation:  9 am - 6 pm, M-F.  Also accepts Medicaid/Medicare and self-pay.  °[facility] ° [address], [place] Phone: (336) 832-3150, Fax: (336) 832-3151. Hours of Operation:  8:30 am - 5:30 pm, M-F.  Also accepts Medicaid and self-pay.  °[facility] [place] [address], [place] Phone: (336) 878-6027   °[facility] [address], [place], [place] (336)723-1848, Ext. 123 Mondays & Thursdays: 7-9 AM.  First 15 patients are seen on a first come, first serve basis. °  ° °Medicaid-accepting [place] Providers: ° °Organization         Address  Phone   Notes  °[facility] [address], [place] (336) 641-2100 Also accepts self-pay patients.  °[facility] [address], [place] ° (336) 856-9996   °[facility] [address], [place]  (336) 288-8857   °[facility] [address] [place] [address], [place] (336) 299-7000   °Veita Bland [address], [place]  ° (336) 373-1557 Only accepts [place] Access Medicaid patients after they have their name applied to their card.  ° °Self-Pay (no insurance) in [place]: ° °Organization         Address  Phone   Notes  °Sickle Cell Patients, [facility] [address], [place] (336) 832-1970   °[facility] [address], [place] (336) 832-4400   °[facility] ° [address], [place] (336) 992-4800   °[facility]/Dr. Osei-Bonsu ° [address] [place] [address], [place] or [address], [place] (336) 841-8500 Phone number for both [place] and [place] locations is the same.  °[facility] [address], [place] (336) 299-0000   °[facility] [address] [place] [address], [place] or [address] (336) 852-7530 °(336) 878-2260   °[facility] [address], [place] (336) 350-1642, phone; (336) 294-5005, fax Sees patients 1st and 3rd Saturday of every month.  Must not qualify for public or private insurance (i.e. Medicaid, Medicare, [place] Health Choice, Veterans' Benefits) • Household income should be no more than 200% of the poverty level •The clinic cannot treat you if you are pregnant or think you are pregnant • Sexually transmitted diseases are not treated at the clinic.  ° ° °Dental Care: °Organization         Address  Phone  Notes  °[place] [facility] [address], [place] (336) 641-6152 Accepts children up to age 21 who are enrolled in Medicaid or [place] Health Choice; pregnant women with a Medicaid card; and children who have applied for Medicaid or [place] Health Choice, but were declined, whose parents can pay a reduced fee at time of service.  °[place]  [facility] [place]  [address], [place] (336) 641-7733 Accepts children up to age 21 who are enrolled in Medicaid or [place] Health Choice; pregnant women with a Medicaid card; and children who have applied for Medicaid or [place] Health Choice, but were declined, whose parents can pay a reduced fee at time of service.  °[facility] ° [address], [place] (336) 641-4533 Patients are seen by appointment only. Walk-ins are not accepted. [facility] will see patients 18 years of age and older. °Monday - Tuesday (8am-5pm) °Most Wednesdays (8:30-5pm) °$30 per visit, cash only  °[facility] ° [address], [place] (336) 641-4533 Patients are seen by appointment only. Walk-ins are not accepted. [facility] will see patients 18 years of age and older. °One   Wednesday Evening (Monthly: Volunteer Based).  $30 per visit, cash only  °[facility]  (919) 537-3737 for adults; Children under age 4, call Graduate Pediatric Dentistry at (919) [phone]. Children aged 4-14, please call (919) 537-3737 to request a pediatric application. ° Dental services are provided in all areas of dental care including fillings, crowns and bridges, complete and partial dentures, implants, gum treatment, root canals, and extractions. Preventive care is also provided. Treatment is provided to both adults and children. °Patients are selected via a lottery and there is often a waiting list. °  °[facility] [address], °[place] ° (336) 763-8833 [email or web address] °  °[facility] [address], [place], [place] (336)723-1848, Ext. 123 Second and Fourth Thursday of each month, opens at 6:30 AM; Clinic ends at 9 AM.  Patients are seen on a first-come first-served basis, and a limited number are seen during each clinic.  ° °[facility] ° [address], [place], [place] (336) 723-7904    Eligibility Requirements °You must have lived in [place], [place], or [place] counties for at least the last three months. °  You cannot be eligible for state or federal sponsored healthcare insurance, including Veterans Administration, Medicaid, or Medicare. °  You generally cannot be eligible for healthcare insurance through your employer.  °  How to apply: °Eligibility screenings are held every Tuesday and Wednesday afternoon from 1:00 pm until 4:00 pm. You do not need an appointment for the interview!  °[facility] [address], [place], [place] 336-631-2330   °[facility]  336-342-8273   °[facility]  336-703-3100   °[facility]  336-570-6415   ° °Behavioral Health Resources in the Community: °Intensive Outpatient Programs °Organization         Address  Phone  Notes  °[facility] [address], [place], [place] 336-878-6098   °[facility] [address], [place], [place] 336-832-9800   °[facility] [address], [place], [place] ° 336-882-2125   °[facility] [address],  °[place], [place] 1-800-853-5163 or 336-641-4981   °Substance Abuse Resources °Organization         Address  Phone  Notes  °[facility]  336-882-2125   °[facility]  336-784-9470   °[facility]  336-285-9073   °[facility]  336-845-3988   °Residential & Outpatient Substance Abuse Program  1-800-659-3381   °Psychological Services °Organization         Address  Phone  Notes  °[facility]  336- 832-9600   °[facility]  336- 378-7881   °[facility] [address], [place] 1-800-853-5163 or 336-641-4981   ° °Mobile Crisis Teams °Organization         Address  Phone  Notes  °[facility], Mobile Crisis Care Unit  1-877-626-1772   °Assertive °[facility] ° [address]  [place], [place] 336-834-9664   °Sharon DeEsch [address] °[place] 336-554-5454   ° °Self-Help/Support Groups °Organization         Address  Phone             Notes  °[facility]  - variety of support groups  336- 373-1402 Call for more information  °Narcotics Anonymous (NA), [facility] [address], °[place]  2 meetings at this location  ° °  Residential Treatment Programs °Organization         Address  Phone  Notes  °[facility] [address],    °[place]  1-866-801-8205   °[facility] ° [address], [place], [place] 704-293-8524   °[facility] [address], [place] 336-845-3988 Admissions: 8am-3pm M-F  °[facility] [address]    °[place], [place] 336-841-1104   °[facility] [address], [place], [place] 336-379-7146   °[facility] [address]  °[place], [place] 336-285-9073   °[facility] Intensive Outpatient [address], [place], [place] 336-852-3033   °[facility] [address]  °[place], [place] 1-877-615-2722 or 336-784-9470   °[facility] [address] [place], [place] 336-227-7417 Accepts Medicaid  °[facility] [address]  °[place] 1-800-659-3381 Substance Abuse/Addiction Treatment  ° °[place] Behavioral Health Resources °Organization         Address  Phone  Notes  °[facility]  (888) 581-9988   °Julie Brannon, PhD [address] [place], [place]   (336) 349-5553 or (336) 951-0000   °[facility]   [address] °[place], [place] (336) 349-4454   °[facility] [address], [place], [place] (336) 342-8316 Insurance/Medicaid/sponsorship through [facility]  °[facility] [address]                                    [place], [place] (336) 342-8316 Therapy/tele-psych/case    °[facility] [address]  ° [place], [place] (336) 349-2233    °Dr. Arfeen  (336) 349-4544   °[facility] [place]  [employer] [place] [facility] 1) [address],  °2) [address], [place] °3)  [address], [place] (336) 349-3220 °(336) 342-7768 ° °(336) 342-8140   °[place] [facility] (336) 342-1394 or (336) 342-3537 (After Hours)    ° ° °Take the prescription as directed.  Call your regular OB/GYN doctor tomorrow to schedule a follow up appointment within the next 2 days.  Return to the Emergency Department immediately sooner if worsening.  ° °

## 2014-03-22 NOTE — ED Notes (Signed)
Pt reports emesis and inability to keep anything down since yesterday.

## 2014-03-23 ENCOUNTER — Ambulatory Visit (INDEPENDENT_AMBULATORY_CARE_PROVIDER_SITE_OTHER): Payer: Medicaid Other

## 2014-03-23 ENCOUNTER — Encounter (HOSPITAL_COMMUNITY): Payer: Self-pay | Admitting: *Deleted

## 2014-03-23 ENCOUNTER — Emergency Department (HOSPITAL_COMMUNITY)
Admission: EM | Admit: 2014-03-23 | Discharge: 2014-03-23 | Disposition: A | Discharge status: Home / Self Care | Payer: Medicaid Other | Attending: Emergency Medicine | Admitting: Emergency Medicine

## 2014-03-23 ENCOUNTER — Other Ambulatory Visit: Payer: Self-pay | Admitting: Obstetrics & Gynecology

## 2014-03-23 DIAGNOSIS — Z79899 Other long term (current) drug therapy: Secondary | ICD-10-CM | POA: Diagnosis not present

## 2014-03-23 DIAGNOSIS — Z905 Acquired absence of kidney: Secondary | ICD-10-CM | POA: Insufficient documentation

## 2014-03-23 DIAGNOSIS — O3680X0 Pregnancy with inconclusive fetal viability, not applicable or unspecified: Secondary | ICD-10-CM

## 2014-03-23 DIAGNOSIS — Z3A01 Less than 8 weeks gestation of pregnancy: Secondary | ICD-10-CM | POA: Insufficient documentation

## 2014-03-23 DIAGNOSIS — Z87448 Personal history of other diseases of urinary system: Secondary | ICD-10-CM | POA: Insufficient documentation

## 2014-03-23 DIAGNOSIS — Z792 Long term (current) use of antibiotics: Secondary | ICD-10-CM | POA: Insufficient documentation

## 2014-03-23 DIAGNOSIS — O210 Mild hyperemesis gravidarum: Secondary | ICD-10-CM | POA: Diagnosis not present

## 2014-03-23 LAB — URINE MICROSCOPIC-ADD ON

## 2014-03-23 LAB — URINALYSIS, ROUTINE W REFLEX MICROSCOPIC
GLUCOSE, UA: NEGATIVE mg/dL
Leukocytes, UA: NEGATIVE
Nitrite: NEGATIVE
PH: 6 (ref 5.0–8.0)
Protein, ur: 30 mg/dL — AB
Specific Gravity, Urine: 1.025 (ref 1.005–1.030)
Urobilinogen, UA: 1 mg/dL (ref 0.0–1.0)

## 2014-03-23 LAB — URINE CULTURE
COLONY COUNT: NO GROWTH
Culture: NO GROWTH

## 2014-03-23 MED ORDER — SODIUM CHLORIDE 0.9 % IV BOLUS (SEPSIS)
1000.0000 mL | Freq: Once | INTRAVENOUS | Status: AC
  Administered 2014-03-23: 1000 mL via INTRAVENOUS

## 2014-03-23 MED ORDER — PROMETHAZINE HCL 25 MG/ML IJ SOLN
12.5000 mg | Freq: Once | INTRAMUSCULAR | Status: AC
  Administered 2014-03-23: 12.5 mg via INTRAVENOUS
  Filled 2014-03-23: qty 1

## 2014-03-23 MED ORDER — PROMETHAZINE HCL 25 MG PO TABS: 25.0000 mg | ORAL_TABLET | Freq: Four times a day (QID) | ORAL | Status: DC | PRN

## 2014-03-23 MED ORDER — ONDANSETRON HCL 4 MG/2ML IJ SOLN
4.0000 mg | Freq: Once | INTRAMUSCULAR | Status: AC
  Administered 2014-03-23: 4 mg via INTRAVENOUS
  Filled 2014-03-23: qty 2

## 2014-03-23 NOTE — Discharge Instructions (Signed)
Drink frequently in small amounts. Prescription for Phenergan. Return if worse.

## 2014-03-23 NOTE — Progress Notes (Signed)
Viability US completed today.  Single IUP at 7+[redacted] weeks GA with FHR of 154 bpm.  CRL is 12.768mm which is consistent with LMP dating. Cervix is closed. Bilateral ovaries are within normal limits. EDD 11-05-14.

## 2014-03-23 NOTE — ED Provider Notes (Signed)
CSN: [id number]     Arrival date & time 03/23/14  1040 History  This chart was scribed for Charlotte HutchingBrian Frimy Uffelman, MD by Abel PrestoKara Demonbreun, ED Scribe. This patient was seen in room APA18/APA18 and the patient's care was started at 11:58 AM.    Chief Complaint  Patient presents with  . Emesis     Patient is a 19 y.o. female presenting with vomiting. The history is provided by the patient. No language interpreter was used.  Emesis  HPI Comments: [place] Smith is a 19 y.o. female h/o renal disorder and nephrectomy who presents to the Emergency Department complaining of intermittent vomiting with onset 2 days ago. Pt is [redacted] weeks pregnant. Pt's [facility] is [facility]. Pt notes associated loss of appetite and states she is unable to tolerate po.  Pt denies sick contacts. Pt is taking Keflex for a UTI prescribed on 03/12/14. Pt denies abdominal pain and diarrhea. No vaginal bleeding or discharge   Past Medical History  Diagnosis Date  . Pregnant   . Renal disorder     non-functioning left kidney   Past Surgical History  Procedure Laterality Date  . Bladder surgery     History reviewed. No pertinent family history. History  Substance Use Topics  . Smoking status: Never Smoker   . Smokeless tobacco: Not on file  . Alcohol Use: No   OB History    Gravida Para Term Preterm AB TAB SAB Ectopic Multiple Living   2              Review of Systems  Gastrointestinal: Positive for vomiting.  A complete 10 system review of systems was obtained and all systems are negative except as noted in the HPI and PMH.      Allergies  Review of patient's allergies indicates no known allergies.  Home Medications   Prior to Admission medications   Medication Sig Start Date End Date Taking? Authorizing Provider  cephALEXin (KEFLEX) 500 MG capsule Take 1 capsule (500 mg total) by mouth 4 (four) times daily. Patient taking differently: Take 500 mg by mouth 2 (two) times daily.  03/12/14  Yes Gilda Creasehristopher J. Pollina,  MD  ondansetron (ZOFRAN) 4 MG tablet Take 1 tablet (4 mg total) by mouth every 6 (six) hours. 03/12/14  Yes Gilda Creasehristopher J. Pollina, MD  ranitidine (ZANTAC) 150 MG tablet Take 1 tablet (150 mg total) by mouth 2 (two) times daily. 03/12/14  Yes Gilda Creasehristopher J. Pollina, MD  cephALEXin (KEFLEX) 500 MG capsule Take 1 capsule (500 mg total) by mouth at bedtime. X 7 days Patient not taking: Reported on 03/23/2014 03/13/14   Marge DuncansKimberly Randall Booker, CNM  metoCLOPramide (REGLAN) 10 MG tablet Take 1 tablet (10 mg total) by mouth every 8 (eight) hours as needed for nausea or vomiting. Patient not taking: Reported on 03/23/2014 03/22/14   Samuel JesterKathleen McManus, DO  Prenat-FeFum-FePo-FA-Omega 3 (CONCEPT DHA) 53.5-38-1 MG CAPS Take 1 capsule by mouth daily. Patient not taking: Reported on 03/23/2014 03/13/14   Marge DuncansKimberly Randall Booker, CNM  promethazine (PHENERGAN) 25 MG tablet Take 1 tablet (25 mg total) by mouth every 6 (six) hours as needed. 03/23/14   [place] HutchingBrian Brittainy Bucker, MD   BP 101/65 mmHg  Pulse 107  Temp(Src) 98.4 F (36.9 C) (Oral)  Resp 18  Ht 5\' 6"  (1.676 m)  Wt 140 lb (63.504 kg)  BMI 22.61 kg/m2  SpO2 100%  LMP 01/29/2014 Physical Exam  Constitutional: She is oriented to person, place, and time. She appears well-developed and well-nourished.  Appears dehydrated  HENT:  Head: Normocephalic and atraumatic.  Lips dry  Eyes: Conjunctivae and EOM are normal. Pupils are equal, round, and reactive to light.  Neck: Normal range of motion. Neck supple.  Cardiovascular: Normal rate and regular rhythm.   Pulmonary/Chest: Effort normal and breath sounds normal.  Abdominal: Soft. Bowel sounds are normal.  Musculoskeletal: Normal range of motion.  Neurological: She is alert and oriented to person, place, and time.  Skin: Skin is warm and dry.  Psychiatric: She has a normal mood and affect. Her behavior is normal.  Nursing note and vitals reviewed.   ED Course  Procedures (including critical care time) DIAGNOSTIC  STUDIES: Oxygen Saturation is 100% on room air, normal by my interpretation.    COORDINATION OF CARE: 12:02 PM Discussed treatment plan with patient at beside, the patient agrees with the plan and has no further questions at this time.   Labs Review Labs Reviewed  URINALYSIS, ROUTINE W REFLEX MICROSCOPIC - Abnormal; Notable for the following:    APPearance HAZY (*)    Hgb urine dipstick MODERATE (*)    Bilirubin Urine SMALL (*)    Ketones, ur >80 (*)    Protein, ur 30 (*)    All other components within normal limits  URINE MICROSCOPIC-ADD ON - Abnormal; Notable for the following:    Squamous Epithelial / LPF MANY (*)    Bacteria, UA FEW (*)    All other components within normal limits  URINE CULTURE    Imaging Review US Ob Transvaginal  03/23/2014   DATING AND VIABILITY SONOGRAM   Charlotte Smith is a 19 y.o. year old G2P0 with LMP 01/29/14  which  would correlate to [redacted]w[redacted]d weeks gestation.  She has regular menstrual  cycles.   She is here today for a confirmatory initial sonogram.    GESTATION: S[place]LETON     FETAL ACTIVITY:          Heart rate         154 bpm            CERVIX: Closed.  ADNEXA: The ovaries are normal.   GESTATIONAL AGE AND  BIOMETRICS:  Gestational criteria: Estimated Date of Delivery: 11/05/14 by LMP now at  [redacted]w[redacted]d  Previous Scans:0      CROWN RUMP LENGTH           12.8 mm         7+4 weeks                                                                               AVERAGE EGA(BY THIS SCAN):   7+4 weeks  WORKING EDD( LMP ):  11/05/14     TECHNICIAN COMMENTS:  Single IUP at 7+[redacted] weeks GA with FHR of 154 bpm.  CRL is 12.47mm which is  consistent with LMP dating. Cervix is closed. Bilateral ovaries are within  normal limits. EDD 11-05-14.        A copy of this report including all images has been saved and backed up to  a second source for retrieval if needed. All measures and details of the  anatomical scan, placentation, fluid volume and pelvic anatomy are  contained  in that  report.  [place] Smith, [place] Smith 03/23/2014 10:14 AM     EKG Interpretation None      MDM   Final diagnoses:  Hyperemesis gravidarum   Patient feels much better after 3 L of IV fluids. Urine culture. Discharge medications Phenergan 25 mg. Patient instructed to return if she is unable to keep fluids down.    I personally performed the services described in this documentation, which was scribed in my presence. The recorded information has been reviewed and is accurate.     [place] Hutching, MD 03/23/14 1530

## 2014-03-23 NOTE — ED Notes (Signed)
Seen here last night for same, Vomiting, pregnant,- 7 weeks  Has not gotten rx filled.

## 2014-03-24 LAB — URINE CULTURE
Colony Count: NO GROWTH
Culture: NO GROWTH

## 2014-03-27 ENCOUNTER — Encounter: Payer: Self-pay | Admitting: Women's Health

## 2014-03-27 ENCOUNTER — Other Ambulatory Visit (HOSPITAL_COMMUNITY)
Admission: RE | Admit: 2014-03-27 | Discharge: 2014-03-27 | Disposition: A | Discharge status: Home / Self Care | Payer: Medicaid Other | Source: Ambulatory Visit | Attending: Obstetrics & Gynecology | Admitting: Obstetrics & Gynecology

## 2014-03-27 ENCOUNTER — Ambulatory Visit (INDEPENDENT_AMBULATORY_CARE_PROVIDER_SITE_OTHER): Payer: Medicaid Other | Admitting: Women's Health

## 2014-03-27 VITALS — BP 118/62 | HR 104 | Wt 140.0 lb

## 2014-03-27 DIAGNOSIS — Z113 Encounter for screening for infections with a predominantly sexual mode of transmission: Secondary | ICD-10-CM | POA: Insufficient documentation

## 2014-03-27 DIAGNOSIS — Z0283 Encounter for blood-alcohol and blood-drug test: Secondary | ICD-10-CM

## 2014-03-27 DIAGNOSIS — Z23 Encounter for immunization: Secondary | ICD-10-CM | POA: Diagnosis not present

## 2014-03-27 DIAGNOSIS — Z3401 Encounter for supervision of normal first pregnancy, first trimester: Secondary | ICD-10-CM

## 2014-03-27 DIAGNOSIS — Z01419 Encounter for gynecological examination (general) (routine) without abnormal findings: Secondary | ICD-10-CM | POA: Diagnosis not present

## 2014-03-27 DIAGNOSIS — O26831 Pregnancy related renal disease, first trimester: Secondary | ICD-10-CM

## 2014-03-27 DIAGNOSIS — O219 Vomiting of pregnancy, unspecified: Secondary | ICD-10-CM

## 2014-03-27 DIAGNOSIS — Z369 Encounter for antenatal screening, unspecified: Secondary | ICD-10-CM

## 2014-03-27 DIAGNOSIS — Z124 Encounter for screening for malignant neoplasm of cervix: Secondary | ICD-10-CM

## 2014-03-27 DIAGNOSIS — Z3682 Encounter for antenatal screening for nuchal translucency: Secondary | ICD-10-CM

## 2014-03-27 DIAGNOSIS — Z331 Pregnant state, incidental: Secondary | ICD-10-CM

## 2014-03-27 DIAGNOSIS — Z1389 Encounter for screening for other disorder: Secondary | ICD-10-CM

## 2014-03-27 DIAGNOSIS — Z3400 Encounter for supervision of normal first pregnancy, unspecified trimester: Secondary | ICD-10-CM | POA: Insufficient documentation

## 2014-03-27 LAB — POCT URINALYSIS DIPSTICK
GLUCOSE UA: NEGATIVE
KETONES UA: NEGATIVE
Leukocytes, UA: NEGATIVE
Nitrite, UA: NEGATIVE

## 2014-03-27 NOTE — Patient Instructions (Signed)

## 2014-03-27 NOTE — Addendum Note (Signed)
Addended by: Gaylyn RongEVANS, Valjean Ruppel A on: 03/27/2014 10:50 AM   Modules accepted: Orders

## 2014-03-27 NOTE — Progress Notes (Signed)
  Subjective:  Charlotte Smith is a 19 y.o. G1P0 Caucasian female at 711w1d by LMP c/w 7wk u/s, being seen today for her first obstetrical visit.  Her obstetrical history is significant for primigravida, Lt degenerated/nonfunctioning kidney since childhood- recurrent uti's- placed on keflex suppression therapy at last visit.  Needs referral to urology, hasn't been in 7319yrs d/t no insurance. Pregnancy history fully reviewed.  Patient reports n/v- has had 2 er visits w/ ivf. Was taking 4 diclegis daily as directed and wasn't helping. Now has rx for phenergan which is helping better, only vomiting once q am and once q pm. . Denies vb, cramping, uti s/s, abnormal/malodorous vag d/c, or vulvovaginal itching/irritation.  BP 118/62 mmHg  Pulse 104  Wt 140 lb (63.504 kg)  LMP 01/29/2014  HISTORY: OB History  Gravida Para Term Preterm AB SAB TAB Ectopic Multiple Living  1             # Outcome Date GA Lbr Len/2nd Weight Sex Delivery Anes PTL Lv  1 Current              Past Medical History  Diagnosis Date  . Pregnant   . Renal disorder     non-functioning left kidney   Past Surgical History  Procedure Laterality Date  . Bladder surgery     Family History  Problem Relation Age of Onset  . Depression Mother   . Hypertension Father   . Cancer Maternal Grandmother     lung  . Cancer Paternal Grandfather     testicular, lung    Exam   System:     General: Well developed & nourished, no acute distress   Skin: Warm & dry, normal coloration and turgor, no rashes   Neurologic: Alert & oriented, normal mood   Cardiovascular: Regular rate & rhythm   Respiratory: Effort & rate normal, LCTAB, acyanotic   Abdomen: Soft, non tender   Extremities: normal strength, tone   Pelvic Exam:    Perineum: Normal perineum   Vulva: Normal, no lesions   Vagina:  Normal mucosa, normal discharge   Cervix: Normal, bulbous, appears closed   Uterus: Normal size/shape/contour for GA   Thin prep pap  smear obtained w/ reflex high risk HPV cotesting  Assessment:   Pregnancy: G1P0 Patient Active Problem List   Diagnosis Date Noted  . Supervision of normal first pregnancy 03/27/2014    Priority: High  . Renal disease in pregnancy 03/13/2014    Priority: High    7911w1d G1P0 New OB visit Lt degenerated/nonfunctioning kidney N/V of pregnancy  Plan:  Initial labs drawn including CMP and baseline 24hr urine protein Continue prenatal vitamins Problem list reviewed and updated Reviewed n/v relief measures and warning s/s to report Reviewed recommended weight gain based on pre-gravid BMI Encouraged well-balanced diet Genetic Screening discussed Integrated Screen: requested Cystic fibrosis screening discussed requested Ultrasound discussed; fetal survey: requested Follow up in 4 weeks for 1st it/nt and visit [facility] completed Flu shot today [facility] for [facility] abnormality in [facility]- hasn't seen in 8219yrs  Marge DuncansBooker, [place] Smith CNM, [facility]NP[facility]BC 03/27/2014 10:33 AM

## 2014-03-28 LAB — URINE CULTURE: ORGANISM ID, BACTERIA: NO GROWTH

## 2014-03-28 LAB — CYTOLOGY - PAP

## 2014-03-31 LAB — MICROSCOPIC EXAMINATION
CASTS: NONE SEEN /LPF
Epithelial Cells (non renal): >10 /hpf — AB (ref 0–10)

## 2014-03-31 LAB — URINALYSIS, ROUTINE W REFLEX MICROSCOPIC
Bilirubin, UA: NEGATIVE
Ketones, UA: NEGATIVE
LEUKOCYTES UA: NEGATIVE
Nitrite, UA: NEGATIVE
SPEC GRAV UA: 1.019 (ref 1.005–1.030)
UUROB: 1 mg/dL (ref 0.2–1.0)
pH, UA: 7.5 (ref 5.0–7.5)

## 2014-03-31 LAB — PMP SCREEN PROFILE (10S), URINE
AMPHETAMINE SCRN UR: NEGATIVE ng/mL
BENZODIAZEPINE SCREEN, URINE: NEGATIVE ng/mL
Barbiturate Screen, Ur: NEGATIVE ng/mL
CANNABINOIDS UR QL SCN: NEGATIVE ng/mL
COCAINE(METAB.) SCREEN, URINE: NEGATIVE ng/mL
Creatinine(Crt), U: 135.4 mg/dL (ref 20.0–300.0)
Methadone Scn, Ur: NEGATIVE ng/mL
Opiate Scrn, Ur: NEGATIVE ng/mL
Oxycodone+Oxymorphone Ur Ql Scn: NEGATIVE ng/mL
PCP Scrn, Ur: NEGATIVE ng/mL
PH UR, DRUG SCRN: 7.7 (ref 4.5–8.9)
Propoxyphene, Screen: NEGATIVE ng/mL

## 2014-03-31 LAB — CBC
HCT: 37.2 % (ref 34.0–46.6)
Hemoglobin: 12.5 g/dL (ref 11.1–15.9)
MCH: 28.9 pg (ref 26.6–33.0)
MCHC: 33.6 g/dL (ref 31.5–35.7)
MCV: 86 fL (ref 79–97)
Platelets: 255 10*3/uL (ref 150–379)
RBC: 4.33 x10E6/uL (ref 3.77–5.28)
RDW: 12.6 % (ref 12.3–15.4)
WBC: 9.2 10*3/uL (ref 3.4–10.8)

## 2014-03-31 LAB — COMPREHENSIVE METABOLIC PANEL
A/G RATIO: 2.1 (ref 1.1–2.5)
ALBUMIN: 4.6 g/dL (ref 3.5–5.5)
ALK PHOS: 41 IU/L (ref 39–117)
ALT: 8 IU/L (ref 0–32)
AST: 15 IU/L (ref 0–40)
BILIRUBIN TOTAL: 0.3 mg/dL (ref 0.0–1.2)
BUN / CREAT RATIO: 14 (ref 8–20)
BUN: 10 mg/dL (ref 6–20)
CO2: 22 mmol/L (ref 18–29)
CREATININE: 0.73 mg/dL (ref 0.57–1.00)
Calcium: 9.9 mg/dL (ref 8.7–10.2)
Chloride: 100 mmol/L (ref 97–108)
GFR calc non Af Amer: 118 mL/min/{1.73_m2} (ref >59)
GFR, EST AFRICAN AMERICAN: 136 mL/min/{1.73_m2} (ref >59)
GLOBULIN, TOTAL: 2.2 g/dL (ref 1.5–4.5)
Glucose: 71 mg/dL (ref 65–99)
Potassium: 3.8 mmol/L (ref 3.5–5.2)
SODIUM: 137 mmol/L (ref 134–144)
Total Protein: 6.8 g/dL (ref 6.0–8.5)

## 2014-03-31 LAB — CYSTIC FIBROSIS MUTATION 97: GENE DIS ANAL CARRIER INTERP BLD/T-IMP: NOT DETECTED

## 2014-03-31 LAB — RUBELLA SCREEN: Rubella Antibodies, IGG: 4.54 index (ref >0.99)

## 2014-03-31 LAB — ABO/RH: RH TYPE: POSITIVE

## 2014-03-31 LAB — VARICELLA ZOSTER ANTIBODY, IGG: Varicella zoster IgG: 817 index (ref >165)

## 2014-03-31 LAB — HEPATITIS B SURFACE ANTIGEN: HEP B S AG: NEGATIVE

## 2014-03-31 LAB — HIV ANTIBODY (ROUTINE TESTING W REFLEX): HIV Screen 4th Generation wRfx: NONREACTIVE

## 2014-03-31 LAB — ANTIBODY SCREEN: ANTIBODY SCREEN: NEGATIVE

## 2014-03-31 LAB — RPR: RPR: NONREACTIVE

## 2014-04-09 ENCOUNTER — Emergency Department (HOSPITAL_COMMUNITY)
Admission: EM | Admit: 2014-04-09 | Discharge: 2014-04-09 | Disposition: A | Discharge status: Home / Self Care | Payer: Medicaid Other | Attending: Emergency Medicine | Admitting: Emergency Medicine

## 2014-04-09 ENCOUNTER — Encounter (HOSPITAL_COMMUNITY): Payer: Self-pay | Admitting: Emergency Medicine

## 2014-04-09 DIAGNOSIS — O210 Mild hyperemesis gravidarum: Secondary | ICD-10-CM | POA: Insufficient documentation

## 2014-04-09 DIAGNOSIS — Z3490 Encounter for supervision of normal pregnancy, unspecified, unspecified trimester: Secondary | ICD-10-CM

## 2014-04-09 DIAGNOSIS — Z3A10 10 weeks gestation of pregnancy: Secondary | ICD-10-CM | POA: Diagnosis not present

## 2014-04-09 DIAGNOSIS — Z79899 Other long term (current) drug therapy: Secondary | ICD-10-CM | POA: Diagnosis not present

## 2014-04-09 DIAGNOSIS — Z87448 Personal history of other diseases of urinary system: Secondary | ICD-10-CM | POA: Insufficient documentation

## 2014-04-09 DIAGNOSIS — R112 Nausea with vomiting, unspecified: Secondary | ICD-10-CM

## 2014-04-09 LAB — BASIC METABOLIC PANEL
Anion gap: 11 (ref 5–15)
BUN: 12 mg/dL (ref 6–23)
CHLORIDE: 99 mmol/L (ref 96–112)
CO2: 24 mmol/L (ref 19–32)
Calcium: 9.9 mg/dL (ref 8.4–10.5)
Creatinine, Ser: 0.72 mg/dL (ref 0.50–1.10)
GLUCOSE: 108 mg/dL — AB (ref 70–99)
POTASSIUM: 3.3 mmol/L — AB (ref 3.5–5.1)
SODIUM: 134 mmol/L — AB (ref 135–145)

## 2014-04-09 LAB — CBC WITH DIFFERENTIAL/PLATELET
BASOS ABS: 0 10*3/uL (ref 0.0–0.1)
Basophils Relative: 0 % (ref 0–1)
EOS ABS: 0.2 10*3/uL (ref 0.0–0.7)
Eosinophils Relative: 1 % (ref 0–5)
HCT: 37.1 % (ref 36.0–46.0)
HEMOGLOBIN: 13.3 g/dL (ref 12.0–15.0)
Lymphocytes Relative: 12 % (ref 12–46)
Lymphs Abs: 2 10*3/uL (ref 0.7–4.0)
MCH: 30.1 pg (ref 26.0–34.0)
MCHC: 35.8 g/dL (ref 30.0–36.0)
MCV: 83.9 fL (ref 78.0–100.0)
Monocytes Absolute: 0.8 10*3/uL (ref 0.1–1.0)
Monocytes Relative: 5 % (ref 3–12)
NEUTROS ABS: 13.1 10*3/uL — AB (ref 1.7–7.7)
NEUTROS PCT: 82 % — AB (ref 43–77)
Platelets: 249 10*3/uL (ref 150–400)
RBC: 4.42 MIL/uL (ref 3.87–5.11)
RDW: 12 % (ref 11.5–15.5)
WBC: 16.1 10*3/uL — ABNORMAL HIGH (ref 4.0–10.5)

## 2014-04-09 MED ORDER — SODIUM CHLORIDE 0.9 % IV BOLUS (SEPSIS)
1000.0000 mL | Freq: Once | INTRAVENOUS | Status: AC
  Administered 2014-04-09: 1000 mL via INTRAVENOUS

## 2014-04-09 MED ORDER — PROMETHAZINE HCL 25 MG/ML IJ SOLN
12.5000 mg | Freq: Once | INTRAMUSCULAR | Status: AC
  Administered 2014-04-09: 12.5 mg via INTRAVENOUS
  Filled 2014-04-09: qty 1

## 2014-04-09 MED ORDER — ONDANSETRON HCL 4 MG/2ML IJ SOLN
4.0000 mg | Freq: Once | INTRAMUSCULAR | Status: AC
  Administered 2014-04-09: 4 mg via INTRAVENOUS

## 2014-04-09 MED ORDER — ONDANSETRON HCL 4 MG/2ML IJ SOLN
INTRAMUSCULAR | Status: AC
  Filled 2014-04-09: qty 2

## 2014-04-09 MED ORDER — ONDANSETRON 4 MG PO TBDP: 4.0000 mg | ORAL_TABLET | Freq: Three times a day (TID) | ORAL | Status: DC | PRN

## 2014-04-09 MED ORDER — SODIUM CHLORIDE 0.9 % IV SOLN
INTRAVENOUS | Status: DC
  Administered 2014-04-09: 03:00:00 via INTRAVENOUS

## 2014-04-09 NOTE — ED Notes (Signed)
Patient reports is [redacted] weeks pregnant. Complaining of emesis for several weeks. States is having trouble keeping fluids down now.

## 2014-04-09 NOTE — ED Provider Notes (Signed)
CSN: [id number]     Arrival date & time 04/09/14  0210 History   First MD Initiated Contact with Patient 04/09/14 0221     Chief Complaint  Patient presents with  . Emesis During Pregnancy     (Consider location/radiation/quality/duration/timing/severity/associated sxs/prior Treatment) The history is provided by the patient.   patient with 2 day history of nausea and vomiting. Patient has Zofran and Phenergan at home but having trouble vomiting that up. Patient is [redacted] weeks pregnant this is first pregnancy. Due date is October 16. [facility] Patient last seen in the emergency department on March 3 for same complaint. Patient has some discomfort in her upper throat which she thinks is due to the vomiting. But able to swallow things down okay. No abdominal pain no vaginal bleeding.  Past Medical History  Diagnosis Date  . Pregnant   . Renal disorder     non-functioning left kidney   Past Surgical History  Procedure Laterality Date  . Bladder surgery     Family History  Problem Relation Age of Onset  . Depression Mother   . Hypertension Father   . Cancer Maternal Grandmother     lung  . Cancer Paternal Grandfather     testicular, lung   History  Substance Use Topics  . Smoking status: Never Smoker   . Smokeless tobacco: Not on file  . Alcohol Use: No   OB History    Gravida Para Term Preterm AB TAB SAB Ectopic Multiple Living   1              Review of Systems  Constitutional: Negative for fever and fatigue.  HENT: Negative for congestion.   Eyes: Negative for redness.  Respiratory: Negative for shortness of breath.   Cardiovascular: Negative for chest pain.  Gastrointestinal: Positive for nausea and vomiting. Negative for abdominal pain.  Genitourinary: Negative for dysuria and vaginal bleeding.  Musculoskeletal: Negative for back pain.  Skin: Negative for rash.  Neurological: Negative for headaches.  Hematological: Does not bruise/bleed  easily.  Psychiatric/Behavioral: Negative for confusion.      Allergies  Review of patient's allergies indicates no known allergies.  Home Medications   Prior to Admission medications   Medication Sig Start Date End Date Taking? Authorizing Provider  cephALEXin (KEFLEX) 500 MG capsule Take 1 capsule (500 mg total) by mouth 4 (four) times daily. Patient not taking: Reported on 03/27/2014 03/12/14   Gilda Crease, MD  cephALEXin (KEFLEX) 500 MG capsule Take 1 capsule (500 mg total) by mouth at bedtime. X 7 days 03/13/14   Cheral Marker, CNM  metoCLOPramide (REGLAN) 10 MG tablet Take 1 tablet (10 mg total) by mouth every 8 (eight) hours as needed for nausea or vomiting. 03/22/14   Samuel Jester, DO  ondansetron (ZOFRAN ODT) 4 MG disintegrating tablet Take 1 tablet (4 mg total) by mouth every 8 (eight) hours as needed. 04/09/14   Vanetta Mulders, MD  ondansetron (ZOFRAN) 4 MG tablet Take 1 tablet (4 mg total) by mouth every 6 (six) hours. Patient not taking: Reported on 03/27/2014 03/12/14   Gilda Crease, MD  Prenat-FeFum-FePo-FA-Omega 3 (CONCEPT DHA) 53.5-38-1 MG CAPS Take 1 capsule by mouth daily. Patient not taking: Reported on 03/23/2014 03/13/14   Cheral Marker, CNM  promethazine (PHENERGAN) 25 MG tablet Take 1 tablet (25 mg total) by mouth every 6 (six) hours as needed. 03/23/14   Donnetta Hutching, MD  ranitidine (ZANTAC) 150 MG tablet Take 1  tablet (150 mg total) by mouth 2 (two) times daily. 03/12/14   Gilda Crease, MD   BP 116/86 mmHg  Pulse 121  Temp(Src) 98.5 F (36.9 C) (Oral)  Resp 20  Ht  (1.676 m)  Wt 140 lb (63.504 kg)  BMI 22.61 kg/m2  SpO2 99%  LMP 01/29/2014 Physical Exam  Constitutional: She is oriented to person, place, and time. She appears well-developed and well-nourished. No distress.  HENT:  Head: Normocephalic and atraumatic.  Mouth/Throat: Oropharynx is clear and moist.  Eyes: Conjunctivae and EOM are normal. Pupils are equal, round,  and reactive to light.  Neck: Normal range of motion.  Cardiovascular: Normal rate, regular rhythm and normal heart sounds.   Pulmonary/Chest: Effort normal and breath sounds normal. No respiratory distress.  Abdominal: Soft. Bowel sounds are normal. There is no tenderness.  Neurological: She is alert and oriented to person, place, and time. No cranial nerve deficit. She exhibits normal muscle tone. Coordination normal.  Skin: Skin is warm. No rash noted.  Nursing note and vitals reviewed.   ED Course  Procedures (including critical care time) Labs Review Labs Reviewed  CBC WITH DIFFERENTIAL/PLATELET - Abnormal; Notable for the following:    WBC 16.1 (*)    Neutrophils Relative % 82 (*)    Neutro Abs 13.1 (*)    All other components within normal limits  BASIC METABOLIC PANEL - Abnormal; Notable for the following:    Sodium 134 (*)    Potassium 3.3 (*)    Glucose, Bld 108 (*)    All other components within normal limits   Results for orders placed or performed during the hospital encounter of 04/09/14  CBC with Differential/Platelet  Result Value Ref Range   WBC 16.1 (H) 4.0 - 10.5 K/uL   RBC 4.42 3.87 - 5.11 MIL/uL   Hemoglobin 13.3 12.0 - 15.0 g/dL   HCT 16.1 09.6 - 04.5 %   MCV 83.9 78.0 - 100.0 fL   MCH 30.1 26.0 - 34.0 pg   MCHC 35.8 30.0 - 36.0 g/dL   RDW 40.9 81.1 - 91.4 %   Platelets 249 150 - 400 K/uL   Neutrophils Relative % 82 (H) 43 - 77 %   Neutro Abs 13.1 (H) 1.7 - 7.7 K/uL   Lymphocytes Relative 12 12 - 46 %   Lymphs Abs 2.0 0.7 - 4.0 K/uL   Monocytes Relative 5 3 - 12 %   Monocytes Absolute 0.8 0.1 - 1.0 K/uL   Eosinophils Relative 1 0 - 5 %   Eosinophils Absolute 0.2 0.0 - 0.7 K/uL   Basophils Relative 0 0 - 1 %   Basophils Absolute 0.0 0.0 - 0.1 K/uL  Basic metabolic panel  Result Value Ref Range   Sodium 134 (L) 135 - 145 mmol/L   Potassium 3.3 (L) 3.5 - 5.1 mmol/L   Chloride 99 96 - 112 mmol/L   CO2 24 19 - 32 mmol/L   Glucose, Bld 108 (H) 70 -  99 mg/dL   BUN 12 6 - 23 mg/dL   Creatinine, Ser 7.82 0.50 - 1.10 mg/dL   Calcium 9.9 8.4 - 95.6 mg/dL   GFR calc non Af Amer >90 >90 mL/min   GFR calc Af Amer >90 >90 mL/min   Anion gap 11 5 - 15     Imaging Review No results found.   EKG Interpretation None      MDM   Final diagnoses:  Non-intractable vomiting with nausea, vomiting of  unspecified type  Pregnancy   Patient with nausea and vomiting associated with pregnancy. Labs here without significant abnormalities. Patient received 1 L of fluid. Mucous membranes are moist. No abdominal tenderness. Patient we switched over to Zofran ODT so that she will have trouble vomiting that up. Patient also has Phenergan at home. Patient followed by [facility]. Patient will contact them on Monday for update of her condition. No evidence of any metabolic acidosis. No evidence of any significant renal evidence of dehydration. In the emergency department patient got IV Zofran as well as IV Phenergan.    Vanetta MuldersScott Jye Fariss, MD 04/09/14 [phone]

## 2014-04-09 NOTE — Discharge Instructions (Signed)
Continue take the Zofran and the Phenergan together. Work on small amounts of fluids as frequently as possible. Call [facility] on Monday for update of your condition. Return for any new or worse symptoms.

## 2014-04-11 ENCOUNTER — Other Ambulatory Visit: Payer: Self-pay | Admitting: Women's Health

## 2014-04-11 DIAGNOSIS — O26831 Pregnancy related renal disease, first trimester: Secondary | ICD-10-CM

## 2014-04-12 ENCOUNTER — Encounter (HOSPITAL_COMMUNITY): Payer: Self-pay

## 2014-04-12 ENCOUNTER — Emergency Department (HOSPITAL_COMMUNITY)
Admission: EM | Admit: 2014-04-12 | Discharge: 2014-04-13 | Disposition: A | Discharge status: Home / Self Care | Payer: Medicaid Other | Attending: Emergency Medicine | Admitting: Emergency Medicine

## 2014-04-12 DIAGNOSIS — O9989 Other specified diseases and conditions complicating pregnancy, childbirth and the puerperium: Secondary | ICD-10-CM | POA: Insufficient documentation

## 2014-04-12 DIAGNOSIS — Z79899 Other long term (current) drug therapy: Secondary | ICD-10-CM | POA: Insufficient documentation

## 2014-04-12 DIAGNOSIS — O210 Mild hyperemesis gravidarum: Secondary | ICD-10-CM | POA: Diagnosis present

## 2014-04-12 DIAGNOSIS — Z792 Long term (current) use of antibiotics: Secondary | ICD-10-CM | POA: Insufficient documentation

## 2014-04-12 DIAGNOSIS — Z87448 Personal history of other diseases of urinary system: Secondary | ICD-10-CM | POA: Diagnosis not present

## 2014-04-12 DIAGNOSIS — Z3A10 10 weeks gestation of pregnancy: Secondary | ICD-10-CM | POA: Insufficient documentation

## 2014-04-12 DIAGNOSIS — R109 Unspecified abdominal pain: Secondary | ICD-10-CM | POA: Diagnosis not present

## 2014-04-12 LAB — BASIC METABOLIC PANEL WITH GFR
Anion gap: 12 (ref 5–15)
BUN: 13 mg/dL (ref 6–23)
CO2: 23 mmol/L (ref 19–32)
Calcium: 9.6 mg/dL (ref 8.4–10.5)
Chloride: 98 mmol/L (ref 96–112)
Creatinine, Ser: 0.71 mg/dL (ref 0.50–1.10)
GFR calc Af Amer: >90 mL/min
GFR calc non Af Amer: >90 mL/min
Glucose, Bld: 89 mg/dL (ref 70–99)
Potassium: 3.4 mmol/L — ABNORMAL LOW (ref 3.5–5.1)
Sodium: 133 mmol/L — ABNORMAL LOW (ref 135–145)

## 2014-04-12 LAB — URINE MICROSCOPIC-ADD ON

## 2014-04-12 LAB — CBC WITH DIFFERENTIAL/PLATELET
Basophils Absolute: 0 K/uL (ref 0.0–0.1)
Basophils Relative: 0 % (ref 0–1)
Eosinophils Absolute: 0.1 K/uL (ref 0.0–0.7)
Eosinophils Relative: 1 % (ref 0–5)
HCT: 35.4 % — ABNORMAL LOW (ref 36.0–46.0)
Hemoglobin: 12.7 g/dL (ref 12.0–15.0)
Lymphocytes Relative: 14 % (ref 12–46)
Lymphs Abs: 1.6 K/uL (ref 0.7–4.0)
MCH: 30 pg (ref 26.0–34.0)
MCHC: 35.9 g/dL (ref 30.0–36.0)
MCV: 83.7 fL (ref 78.0–100.0)
Monocytes Absolute: 0.7 K/uL (ref 0.1–1.0)
Monocytes Relative: 6 % (ref 3–12)
Neutro Abs: 9.5 K/uL — ABNORMAL HIGH (ref 1.7–7.7)
Neutrophils Relative %: 79 % — ABNORMAL HIGH (ref 43–77)
Platelets: 235 K/uL (ref 150–400)
RBC: 4.23 MIL/uL (ref 3.87–5.11)
RDW: 12 % (ref 11.5–15.5)
WBC: 11.9 K/uL — ABNORMAL HIGH (ref 4.0–10.5)

## 2014-04-12 LAB — URINALYSIS, ROUTINE W REFLEX MICROSCOPIC
Glucose, UA: NEGATIVE mg/dL
Ketones, ur: >80 mg/dL — AB
Leukocytes, UA: NEGATIVE
Nitrite: NEGATIVE
PROTEIN: 100 mg/dL — AB
Specific Gravity, Urine: >1.03 — ABNORMAL HIGH (ref 1.005–1.030)
Urobilinogen, UA: 1 mg/dL (ref 0.0–1.0)
pH: 6.5 (ref 5.0–8.0)

## 2014-04-12 LAB — PROTEIN, URINE, 24 HOUR
Protein, 24H Urine: 142.8 mg/24 hr (ref 30.0–150.0)
Protein, Ur: 23.8 mg/dL — ABNORMAL HIGH (ref 0.0–15.0)

## 2014-04-12 MED ORDER — ONDANSETRON HCL 4 MG/2ML IJ SOLN
4.0000 mg | Freq: Once | INTRAMUSCULAR | Status: DC
  Filled 2014-04-12: qty 2

## 2014-04-12 MED ORDER — DEXTROSE IN LACTATED RINGERS 5 % IV SOLN
INTRAVENOUS | Status: DC
  Administered 2014-04-13: 01:00:00 via INTRAVENOUS

## 2014-04-12 MED ORDER — DEXTROSE 5 % IN LACTATED RINGERS IV BOLUS
1000.0000 mL | Freq: Once | INTRAVENOUS | Status: AC
  Administered 2014-04-12: 1000 mL via INTRAVENOUS

## 2014-04-12 NOTE — ED Notes (Signed)
I have been vomiting since I was [redacted] week pregnant. I was here on Sunday for the same thing. I have been prescribed phenergan, zofran, and reglan per pt. I can't keep anything down, not even the medication.

## 2014-04-12 NOTE — ED Notes (Signed)
Spoke with Malena CatholicSamantha Wilson, RN who stated that she gave the patient her Zofran [id number]

## 2014-04-12 NOTE — ED Notes (Signed)
PO fluids given to patient at this time. 

## 2014-04-12 NOTE — ED Provider Notes (Signed)
CSN: [id number]     Arrival date & time 04/12/14  2142 History   This chart was scribed for Dione Booze, MD by Abel Presto, ED Scribe. This patient was seen in room APA17/APA17 and the patient's care was started at 10:15 PM.    Chief Complaint  Patient presents with  . Emesis During Pregnancy     The history is provided by the patient. No language interpreter was used.   HPI Comments: Charlotte Smith is a 19 y.o. female who presents to the Emergency Department complaining of vomiting with onset 1 month ago, worsening today. Pt is [redacted] weeks pregnant, she notes she has been unable to tolerate fluid intake today. Pt notes associated mild abdominal pain and constant nausea. Pt states she is unable to tolerate po intake of Zofran and Phenergan. Pt was last seen in ED on 04/09/14 for same pt receive IV Zofran and Phenergan at that time. She states she felt well after discharge but vomiting returned the next day. GPA is 1/0/0. Pt is followed by [facility]. Pt denies vaginal bleeding and any other medical complaints at this time.   Past Medical History  Diagnosis Date  . Pregnant   . Renal disorder     non-functioning left kidney   Past Surgical History  Procedure Laterality Date  . Bladder surgery     Family History  Problem Relation Age of Onset  . Depression Mother   . Hypertension Father   . Cancer Maternal Grandmother     lung  . Cancer Paternal Grandfather     testicular, lung   History  Substance Use Topics  . Smoking status: Never Smoker   . Smokeless tobacco: Not on file  . Alcohol Use: No   OB History    Gravida Para Term Preterm AB TAB SAB Ectopic Multiple Living   1              Review of Systems  Gastrointestinal: Positive for nausea, vomiting and abdominal pain.  Genitourinary: Negative for vaginal bleeding.  All other systems reviewed and are negative.     Allergies  Review of patient's allergies indicates no known allergies.  Home Medications    Prior to Admission medications   Medication Sig Start Date End Date Taking? Authorizing Provider  cephALEXin (KEFLEX) 500 MG capsule Take 1 capsule (500 mg total) by mouth 4 (four) times daily. Patient not taking: Reported on 03/27/2014 03/12/14   Gilda Crease, MD  cephALEXin (KEFLEX) 500 MG capsule Take 1 capsule (500 mg total) by mouth at bedtime. X 7 days 03/13/14   Cheral Marker, CNM  metoCLOPramide (REGLAN) 10 MG tablet Take 1 tablet (10 mg total) by mouth every 8 (eight) hours as needed for nausea or vomiting. 03/22/14   Samuel Jester, DO  ondansetron (ZOFRAN ODT) 4 MG disintegrating tablet Take 1 tablet (4 mg total) by mouth every 8 (eight) hours as needed. 04/09/14   Vanetta Mulders, MD  ondansetron (ZOFRAN) 4 MG tablet Take 1 tablet (4 mg total) by mouth every 6 (six) hours. Patient not taking: Reported on 03/27/2014 03/12/14   Gilda Crease, MD  Prenat-FeFum-FePo-FA-Omega 3 (CONCEPT DHA) 53.5-38-1 MG CAPS Take 1 capsule by mouth daily. Patient not taking: Reported on 03/23/2014 03/13/14   Cheral Marker, CNM  promethazine (PHENERGAN) 25 MG tablet Take 1 tablet (25 mg total) by mouth every 6 (six) hours as needed. 03/23/14   Donnetta Hutching, MD  ranitidine (ZANTAC) 150 MG tablet Take 1 tablet (  150 mg total) by mouth 2 (two) times daily. 03/12/14   Gilda Crease, MD   BP 111/76 mmHg  Pulse 110  Temp(Src) 99.3 F (37.4 C) (Oral)  Resp 24  Ht  (1.676 m)  Wt 140 lb (63.504 kg)  BMI 22.61 kg/m2  SpO2 99%  LMP 01/29/2014 Physical Exam  Constitutional: She is oriented to person, place, and time. She appears well-developed and well-nourished.  HENT:  Head: Normocephalic.  Eyes: Conjunctivae are normal. Pupils are equal, round, and reactive to light.  Neck: Normal range of motion. Neck supple. No JVD present.  Cardiovascular: Normal rate, regular rhythm and normal heart sounds.   No murmur heard. Pulmonary/Chest: Effort normal and breath sounds normal. She has  no wheezes. She has no rales.  Abdominal: Soft. She exhibits no distension and no mass. Bowel sounds are decreased. There is no tenderness.  Musculoskeletal: Normal range of motion. She exhibits no edema.  Lymphadenopathy:    She has no cervical adenopathy.  Neurological: She is alert and oriented to person, place, and time. No cranial nerve deficit. Coordination normal.  Skin: Skin is warm and dry. No rash noted.  Psychiatric: She has a normal mood and affect. Her behavior is normal. Thought content normal.  Nursing note and vitals reviewed.   ED Course  Procedures (including critical care time) DIAGNOSTIC STUDIES: Oxygen Saturation is 99% on room air, normal by my interpretation.    COORDINATION OF CARE: 10:21 PM Discussed treatment plan with patient at beside, the patient agrees with the plan and has no further questions at this time.   Labs Review Results for orders placed or performed during the hospital encounter of 04/12/14  Basic metabolic panel  Result Value Ref Range   Sodium 133 (L) 135 - 145 mmol/L   Potassium 3.4 (L) 3.5 - 5.1 mmol/L   Chloride 98 96 - 112 mmol/L   CO2 23 19 - 32 mmol/L   Glucose, Bld 89 70 - 99 mg/dL   BUN 13 6 - 23 mg/dL   Creatinine, Ser 1.61 0.50 - 1.10 mg/dL   Calcium 9.6 8.4 - 09.6 mg/dL   GFR calc non Af Amer >90 >90 mL/min   GFR calc Af Amer >90 >90 mL/min   Anion gap 12 5 - 15  CBC with Differential  Result Value Ref Range   WBC 11.9 (H) 4.0 - 10.5 K/uL   RBC 4.23 3.87 - 5.11 MIL/uL   Hemoglobin 12.7 12.0 - 15.0 g/dL   HCT 04.5 (L) 40.9 - 81.1 %   MCV 83.7 78.0 - 100.0 fL   MCH 30.0 26.0 - 34.0 pg   MCHC 35.9 30.0 - 36.0 g/dL   RDW 91.4 78.2 - 95.6 %   Platelets 235 150 - 400 K/uL   Neutrophils Relative % 79 (H) 43 - 77 %   Neutro Abs 9.5 (H) 1.7 - 7.7 K/uL   Lymphocytes Relative 14 12 - 46 %   Lymphs Abs 1.6 0.7 - 4.0 K/uL   Monocytes Relative 6 3 - 12 %   Monocytes Absolute 0.7 0.1 - 1.0 K/uL   Eosinophils Relative 1 0 - 5 %    Eosinophils Absolute 0.1 0.0 - 0.7 K/uL   Basophils Relative 0 0 - 1 %   Basophils Absolute 0.0 0.0 - 0.1 K/uL  Urinalysis, Routine w reflex microscopic  Result Value Ref Range   Color, Urine YELLOW YELLOW   APPearance CLEAR CLEAR   Specific Gravity, Urine >1.030 (H) 1.005 -  1.030   pH 6.5 5.0 - 8.0   Glucose, UA NEGATIVE NEGATIVE mg/dL   Hgb urine dipstick MODERATE (A) NEGATIVE   Bilirubin Urine SMALL (A) NEGATIVE   Ketones, ur >80 (A) NEGATIVE mg/dL   Protein, ur 409100 (A) NEGATIVE mg/dL   Urobilinogen, UA 1.0 0.0 - 1.0 mg/dL   Nitrite NEGATIVE NEGATIVE   Leukocytes, UA NEGATIVE NEGATIVE  hCG, quantitative, pregnancy  Result Value Ref Range   hCG, Beta Chain, Quant, S [id number] (H) <5 mIU/mL  Urine microscopic-add on  Result Value Ref Range   Squamous Epithelial / LPF MANY (A) RARE   WBC, UA 0-2 <3 WBC/hpf   RBC / HPF 0-2 <3 RBC/hpf   Bacteria, UA MANY (A) RARE    MDM   Final diagnoses:  Hyperemesis gravidarum    Hyperemesis gravidarum which his not responding to outpatient treatment. IV is started and she is given hydration with glucose containing solution and she's given ondansetron for nausea. There is modest improvement with this and she is taken small amount orally without additional vomiting but states he still feels very queasy. HCG level is come back very high which may account for her degree of distress. She is given a dose of metoclopramide and case is endorsed to Dr. Wilkie AyeHorton to decide on disposition.   I personally performed the services described in this documentation, which was scribed in my presence. The recorded information has been reviewed and is accurate.      Dione Boozeavid Tykeisha Peer, MD 04/13/14 Lyda [id number]

## 2014-04-13 LAB — HCG, QUANTITATIVE, PREGNANCY: HCG, BETA CHAIN, QUANT, S: 258759 m[IU]/mL — AB (ref <5)

## 2014-04-13 MED ORDER — DIPHENHYDRAMINE HCL 50 MG/ML IJ SOLN
25.0000 mg | Freq: Once | INTRAMUSCULAR | Status: AC
  Administered 2014-04-13: 25 mg via INTRAVENOUS
  Filled 2014-04-13: qty 1

## 2014-04-13 MED ORDER — PROMETHAZINE HCL 25 MG RE SUPP: 25.0000 mg | Freq: Four times a day (QID) | RECTAL | Status: DC | PRN

## 2014-04-13 MED ORDER — METOCLOPRAMIDE HCL 5 MG/ML IJ SOLN
10.0000 mg | Freq: Once | INTRAMUSCULAR | Status: AC
  Administered 2014-04-13: 10 mg via INTRAVENOUS
  Filled 2014-04-13: qty 2

## 2014-04-13 MED ORDER — PYRIDOXINE HCL 25 MG PO TABS
25.0000 mg | ORAL_TABLET | Freq: Every day | ORAL | Status: DC
  Administered 2014-04-13: 25 mg via ORAL

## 2014-04-13 MED ORDER — THIAMINE HCL 100 MG/ML IJ SOLN
100.0000 mg | Freq: Once | INTRAMUSCULAR | Status: AC
  Administered 2014-04-13: 100 mg via INTRAVENOUS
  Filled 2014-04-13: qty 2

## 2014-04-13 NOTE — Discharge Instructions (Signed)
Hyperemesis Gravidarum °Hyperemesis gravidarum is a severe form of nausea and vomiting that happens during pregnancy. Hyperemesis is worse than morning sickness. It may cause you to have nausea or vomiting all day for many days. It may keep you from eating and drinking enough food and liquids. Hyperemesis usually occurs during the first half (the first 20 weeks) of pregnancy. It often goes away once a woman is in her second half of pregnancy. However, sometimes hyperemesis continues through an entire pregnancy.  °CAUSES  °The cause of this condition is not completely known but is thought to be related to changes in the body's hormones when pregnant. It could be from the high level of the pregnancy hormone or an increase in estrogen in the body.  °SIGNS AND SYMPTOMS  °· Severe nausea and vomiting. °· Nausea that does not go away. °· Vomiting that does not allow you to keep any food down. °· Weight loss and body fluid loss (dehydration). °· Having no desire to eat or not liking food you have previously enjoyed. °DIAGNOSIS  °Your health care provider will do a physical exam and ask you about your symptoms. He or she may also order blood tests and urine tests to make sure something else is not causing the problem.  °TREATMENT  °You may only need medicine to control the problem. If medicines do not control the nausea and vomiting, you will be treated in the hospital to prevent dehydration, increased acid in the blood (acidosis), weight loss, and changes in the electrolytes in your body that may harm the unborn baby (fetus). You may need IV fluids.  °HOME CARE INSTRUCTIONS  °· Only take over-the-counter or prescription medicines as directed by your health care provider. °· Try eating a couple of dry crackers or toast in the morning before getting out of bed. °· Avoid foods and smells that upset your stomach. °· Avoid fatty and spicy foods. °· Eat 5-6 small meals a day. °· Do not drink when eating meals. Drink between  meals. °· For snacks, eat high-protein foods, such as cheese. °· Eat or suck on things that have ginger in them. Ginger helps nausea. °· Avoid food preparation. The smell of food can spoil your appetite. °· Avoid iron pills and iron in your multivitamins until after 3-4 months of being pregnant. However, consult with your health care provider before stopping any prescribed iron pills. °SEEK MEDICAL CARE IF:  °· Your abdominal pain increases. °· You have a severe headache. °· You have vision problems. °· You are losing weight. °SEEK IMMEDIATE MEDICAL CARE IF:  °· You are unable to keep fluids down. °· You vomit blood. °· You have constant nausea and vomiting. °· You have excessive weakness. °· You have extreme thirst. °· You have dizziness or fainting. °· You have a fever or persistent symptoms for more than 2-3 days. °· You have a fever and your symptoms suddenly get worse. °MAKE SURE YOU:  °· Understand these instructions. °· Will watch your condition. °· Will get help right away if you are not doing well or get worse. °Document Released: 01/06/2005 Document Revised: 10/27/2012 Document Reviewed: 08/18/2012 °[employer]® Patient Information ©2015 [employer]. This information is not intended to replace advice given to you by your health care provider. Make sure you discuss any questions you have with your health care provider. ° °

## 2014-04-13 NOTE — ED Notes (Signed)
Patient states that she was able to keep the water down, but that she remains nauseated and her stomach hurts. MD notified, no new orders given.

## 2014-04-24 ENCOUNTER — Other Ambulatory Visit: Payer: Medicaid Other

## 2014-04-24 ENCOUNTER — Encounter: Payer: Medicaid Other | Admitting: Women's Health

## 2014-04-26 ENCOUNTER — Ambulatory Visit (INDEPENDENT_AMBULATORY_CARE_PROVIDER_SITE_OTHER): Payer: Medicaid Other

## 2014-04-26 ENCOUNTER — Ambulatory Visit (INDEPENDENT_AMBULATORY_CARE_PROVIDER_SITE_OTHER): Payer: Medicaid Other | Admitting: Women's Health

## 2014-04-26 ENCOUNTER — Encounter: Payer: Self-pay | Admitting: Women's Health

## 2014-04-26 VITALS — BP 132/60 | HR 80 | Wt 138.5 lb

## 2014-04-26 DIAGNOSIS — Z3682 Encounter for antenatal screening for nuchal translucency: Secondary | ICD-10-CM

## 2014-04-26 DIAGNOSIS — Z36 Encounter for antenatal screening of mother: Secondary | ICD-10-CM | POA: Diagnosis not present

## 2014-04-26 DIAGNOSIS — Z3401 Encounter for supervision of normal first pregnancy, first trimester: Secondary | ICD-10-CM

## 2014-04-26 DIAGNOSIS — Z331 Pregnant state, incidental: Secondary | ICD-10-CM

## 2014-04-26 DIAGNOSIS — Z1389 Encounter for screening for other disorder: Secondary | ICD-10-CM

## 2014-04-26 DIAGNOSIS — Q639 Congenital malformation of kidney, unspecified: Secondary | ICD-10-CM

## 2014-04-26 LAB — POCT URINALYSIS DIPSTICK
Glucose, UA: NEGATIVE
Ketones, UA: NEGATIVE
Leukocytes, UA: NEGATIVE
Nitrite, UA: NEGATIVE
Protein, UA: NEGATIVE

## 2014-04-26 MED ORDER — CEPHALEXIN 500 MG PO CAPS: 500.0000 mg | ORAL_CAPSULE | Freq: Every day | ORAL | Status: DC

## 2014-04-26 NOTE — Progress Notes (Signed)
Low-risk OB appointment G1P0 1125w3d Estimated Date of Delivery: 11/05/14 BP 132/60 mmHg  Pulse 80  Wt 138 lb 8 oz (62.823 kg)  LMP 01/29/2014  BP, weight, reviewed.  Voided right before u/s and didn't catch in cup. Will try again when she comes back for labs. Refer to obstetrical flow sheet for FH & FHR.  No fm yet. Denies cramping, lof, vb, or uti s/s. No complaints.  N/V getting much better, no vomiting in >1wk. Offered NFP- accepted, referral faxed. States she never heard from [facility] after last visit. Will place referral again- and gave pt # to call them.  Reviewed today's normal nt u/s, warning s/s to report. Plan:  Continue routine obstetrical care. Will return after 1:30 when [facility] reopened after lunch for IT.  F/U in 4wks for OB appointment and 2nd IT 1st IT/NT today

## 2014-04-26 NOTE — Progress Notes (Signed)
US/6237w3d single iup,fht 149bpm,CRL c/w dates,normal ov's bilat,post pl gr 0,nb present,NT 1.30MM

## 2014-04-26 NOTE — Patient Instructions (Addendum)
[facility] [phone]  First Trimester of Pregnancy The first trimester of pregnancy is from week 1 until the end of week 12 (months 1 through 3). A week after a sperm fertilizes an egg, the egg will implant on the wall of the uterus. This embryo will begin to develop into a baby. Genes from you and your partner are forming the baby. The female genes determine whether the baby is a boy or a girl. At 6-8 weeks, the eyes and face are formed, and the heartbeat can be seen on ultrasound. At the end of 12 weeks, all the baby's organs are formed.  Now that you are pregnant, you will want to do everything you can to have a healthy baby. Two of the most important things are to get good prenatal care and to follow your health care provider's instructions. Prenatal care is all the medical care you receive before the baby's birth. This care will help prevent, find, and treat any problems during the pregnancy and childbirth. BODY CHANGES Your body goes through many changes during pregnancy. The changes vary from woman to woman.   You may gain or lose a couple of pounds at first.  You may feel sick to your stomach (nauseous) and throw up (vomit). If the vomiting is uncontrollable, call your health care provider.  You may tire easily.  You may develop headaches that can be relieved by medicines approved by your health care provider.  You may urinate more often. Painful urination may mean you have a bladder infection.  You may develop heartburn as a result of your pregnancy.  You may develop constipation because certain hormones are causing the muscles that push waste through your intestines to slow down.  You may develop hemorrhoids or swollen, bulging veins (varicose veins).  Your breasts may begin to grow larger and become tender. Your nipples may stick out more, and the tissue that surrounds them (areola) may become darker.  Your gums may bleed and may be sensitive to brushing and flossing.  Dark  spots or blotches (chloasma, mask of pregnancy) may develop on your face. This will likely fade after the baby is born.  Your menstrual periods will stop.  You may have a loss of appetite.  You may develop cravings for certain kinds of food.  You may have changes in your emotions from day to day, such as being excited to be pregnant or being concerned that something may go wrong with the pregnancy and baby.  You may have more vivid and strange dreams.  You may have changes in your hair. These can include thickening of your hair, rapid growth, and changes in texture. Some women also have hair loss during or after pregnancy, or hair that feels dry or thin. Your hair will most likely return to normal after your baby is born. WHAT TO EXPECT AT YOUR PRENATAL VISITS During a routine prenatal visit:  You will be weighed to make sure you and the baby are growing normally.  Your blood pressure will be taken.  Your abdomen will be measured to track your baby's growth.  The fetal heartbeat will be listened to starting around week 10 or 12 of your pregnancy.  Test results from any previous visits will be discussed. Your health care provider may ask you:  How you are feeling.  If you are feeling the baby move.  If you have had any abnormal symptoms, such as leaking fluid, bleeding, severe headaches, or abdominal cramping.  If you have  any questions. Other tests that may be performed during your first trimester include:  Blood tests to find your blood type and to check for the presence of any previous infections. They will also be used to check for low iron levels (anemia) and Rh antibodies. Later in the pregnancy, blood tests for diabetes will be done along with other tests if problems develop.  Urine tests to check for infections, diabetes, or protein in the urine.  An ultrasound to confirm the proper growth and development of the baby.  An amniocentesis to check for possible genetic  problems.  Fetal screens for spina bifida and Down syndrome.  You may need other tests to make sure you and the baby are doing well. HOME CARE INSTRUCTIONS  Medicines  Follow your health care provider's instructions regarding medicine use. Specific medicines may be either safe or unsafe to take during pregnancy.  Take your prenatal vitamins as directed.  If you develop constipation, try taking a stool softener if your health care provider approves. Diet  Eat regular, well-balanced meals. Choose a variety of foods, such as meat or vegetable-based protein, fish, milk and low-fat dairy products, vegetables, fruits, and whole grain breads and cereals. Your health care provider will help you determine the amount of weight gain that is right for you.  Avoid raw meat and uncooked cheese. These carry germs that can cause birth defects in the baby.  Eating four or five small meals rather than three large meals a day may help relieve nausea and vomiting. If you start to feel nauseous, eating a few soda crackers can be helpful. Drinking liquids between meals instead of during meals also seems to help nausea and vomiting.  If you develop constipation, eat more high-fiber foods, such as fresh vegetables or fruit and whole grains. Drink enough fluids to keep your urine clear or pale yellow. Activity and Exercise  Exercise only as directed by your health care provider. Exercising will help you:  Control your weight.  Stay in shape.  Be prepared for labor and delivery.  Experiencing pain or cramping in the lower abdomen or low back is a good sign that you should stop exercising. Check with your health care provider before continuing normal exercises.  Try to avoid standing for long periods of time. Move your legs often if you must stand in one place for a long time.  Avoid heavy lifting.  Wear low-heeled shoes, and practice good posture.  You may continue to have sex unless your health care  provider directs you otherwise. Relief of Pain or Discomfort  Wear a good support bra for breast tenderness.   Take warm sitz baths to soothe any pain or discomfort caused by hemorrhoids. Use hemorrhoid cream if your health care provider approves.   Rest with your legs elevated if you have leg cramps or low back pain.  If you develop varicose veins in your legs, wear support hose. Elevate your feet for 15 minutes, 3-4 times a day. Limit salt in your diet. Prenatal Care  Schedule your prenatal visits by the twelfth week of pregnancy. They are usually scheduled monthly at first, then more often in the last 2 months before delivery.  Write down your questions. Take them to your prenatal visits.  Keep all your prenatal visits as directed by your health care provider. Safety  Wear your seat belt at all times when driving.  Make a list of emergency phone numbers, including numbers for family, friends, the hospital, and police and  Garment/textile technologistfire departments. General Tips  Ask your health care provider for a referral to a local prenatal education class. Begin classes no later than at the beginning of month 6 of your pregnancy.  Ask for help if you have counseling or nutritional needs during pregnancy. Your health care provider can offer advice or refer you to specialists for help with various needs.  Do not use hot tubs, steam rooms, or saunas.  Do not douche or use tampons or scented sanitary pads.  Do not cross your legs for long periods of time.  Avoid cat litter boxes and soil used by cats. These carry germs that can cause birth defects in the baby and possibly loss of the fetus by miscarriage or stillbirth.  Avoid all smoking, herbs, alcohol, and medicines not prescribed by your health care provider. Chemicals in these affect the formation and growth of the baby.  Schedule a dentist appointment. At home, brush your teeth with a soft toothbrush and be gentle when you floss. SEEK MEDICAL  CARE IF:   You have dizziness.  You have mild pelvic cramps, pelvic pressure, or nagging pain in the abdominal area.  You have persistent nausea, vomiting, or diarrhea.  You have a bad smelling vaginal discharge.  You have pain with urination.  You notice increased swelling in your face, hands, legs, or ankles. SEEK IMMEDIATE MEDICAL CARE IF:   You have a fever.  You are leaking fluid from your vagina.  You have spotting or bleeding from your vagina.  You have severe abdominal cramping or pain.  You have rapid weight gain or loss.  You vomit blood or material that looks like coffee grounds.  You are exposed to [place] measles and have never had them.  You are exposed to fifth disease or chickenpox.  You develop a severe headache.  You have shortness of breath.  You have any kind of trauma, such as from a fall or a car accident. Document Released: 12/31/2000 Document Revised: 05/23/2013 Document Reviewed: 11/16/2012 [facility] Patient Information 2015 [place], [place]. This information is not intended to replace advice given to you by your health care provider. Make sure you discuss any questions you have with your health care provider.

## 2014-04-28 LAB — MATERNAL SCREEN, INTEGRATED #1
CROWN RUMP LENGTH MAT SCREEN: 59.9 mm
Gest. Age on Collection Date: 12.4 weeks
Maternal Age at EDD: 21.7 years
NUCHAL TRANSLUCENCY (NT): 1.3 mm
Number of Fetuses: 1
PAPP-A VALUE: 611.5 ng/mL
WEIGHT: 139 [lb_av]

## 2014-05-08 ENCOUNTER — Telehealth: Payer: Self-pay | Admitting: Women's Health

## 2014-05-08 MED ORDER — ONDANSETRON 4 MG PO TBDP: 4.0000 mg | ORAL_TABLET | Freq: Three times a day (TID) | ORAL | Status: DC | PRN

## 2014-05-09 ENCOUNTER — Telehealth: Payer: Self-pay | Admitting: Women's Health

## 2014-05-09 NOTE — Telephone Encounter (Signed)
Pt requesting refill for Zofran. Zofran e-scribed on 05/08/2014. Pt informed to f/u with pharmacy.

## 2014-05-15 NOTE — Telephone Encounter (Signed)
Pt called back and was informed rx sent to pharmacy.

## 2014-05-24 ENCOUNTER — Ambulatory Visit (INDEPENDENT_AMBULATORY_CARE_PROVIDER_SITE_OTHER): Payer: Medicaid Other | Admitting: Women's Health

## 2014-05-24 ENCOUNTER — Encounter: Payer: Self-pay | Admitting: Women's Health

## 2014-05-24 VITALS — BP 108/68 | HR 72 | Wt 141.0 lb

## 2014-05-24 DIAGNOSIS — Z1389 Encounter for screening for other disorder: Secondary | ICD-10-CM

## 2014-05-24 DIAGNOSIS — Z331 Pregnant state, incidental: Secondary | ICD-10-CM

## 2014-05-24 DIAGNOSIS — Z363 Encounter for antenatal screening for malformations: Secondary | ICD-10-CM

## 2014-05-24 DIAGNOSIS — Z3682 Encounter for antenatal screening for nuchal translucency: Secondary | ICD-10-CM

## 2014-05-24 DIAGNOSIS — Z3402 Encounter for supervision of normal first pregnancy, second trimester: Secondary | ICD-10-CM

## 2014-05-24 LAB — POCT URINALYSIS DIPSTICK
Glucose, UA: NEGATIVE
KETONES UA: NEGATIVE
LEUKOCYTES UA: NEGATIVE
Nitrite, UA: NEGATIVE

## 2014-05-24 NOTE — Patient Instructions (Signed)
Second Trimester of Pregnancy The second trimester is from week 13 through week 28, months 4 through 6. The second trimester is often a time when you feel your best. Your body has also adjusted to being pregnant, and you begin to feel better physically. Usually, morning sickness has lessened or quit completely, you may have more energy, and you may have an increase in appetite. The second trimester is also a time when the fetus is growing rapidly. At the end of the sixth month, the fetus is about 9 inches long and weighs about 1 pounds. You will likely begin to feel the baby move (quickening) between 18 and 20 weeks of the pregnancy. BODY CHANGES Your body goes through many changes during pregnancy. The changes vary from woman to woman.   Your weight will continue to increase. You will notice your lower abdomen bulging out.  You may begin to get stretch marks on your hips, abdomen, and breasts.  You may develop headaches that can be relieved by medicines approved by your health care provider.  You may urinate more often because the fetus is pressing on your bladder.  You may develop or continue to have heartburn as a result of your pregnancy.  You may develop constipation because certain hormones are causing the muscles that push waste through your intestines to slow down.  You may develop hemorrhoids or swollen, bulging veins (varicose veins).  You may have back pain because of the weight gain and pregnancy hormones relaxing your joints between the bones in your pelvis and as a result of a shift in weight and the muscles that support your balance.  Your breasts will continue to grow and be tender.  Your gums may bleed and may be sensitive to brushing and flossing.  Dark spots or blotches (chloasma, mask of pregnancy) may develop on your face. This will likely fade after the baby is born.  A dark line from your belly button to the pubic area (linea nigra) may appear. This will likely fade  after the baby is born.  You may have changes in your hair. These can include thickening of your hair, rapid growth, and changes in texture. Some women also have hair loss during or after pregnancy, or hair that feels dry or thin. Your hair will most likely return to normal after your baby is born. WHAT TO EXPECT AT YOUR PRENATAL VISITS During a routine prenatal visit:  You will be weighed to make sure you and the fetus are growing normally.  Your blood pressure will be taken.  Your abdomen will be measured to track your baby's growth.  The fetal heartbeat will be listened to.  Any test results from the previous visit will be discussed. Your health care provider may ask you:  How you are feeling.  If you are feeling the baby move.  If you have had any abnormal symptoms, such as leaking fluid, bleeding, severe headaches, or abdominal cramping.  If you have any questions. Other tests that may be performed during your second trimester include:  Blood tests that check for:  Low iron levels (anemia).  Gestational diabetes (between 24 and 28 weeks).  Rh antibodies.  Urine tests to check for infections, diabetes, or protein in the urine.  An ultrasound to confirm the proper growth and development of the baby.  An amniocentesis to check for possible genetic problems.  Fetal screens for spina bifida and Down syndrome. HOME CARE INSTRUCTIONS   Avoid all smoking, herbs, alcohol, and unprescribed   drugs. These chemicals affect the formation and growth of the baby.  Follow your health care provider's instructions regarding medicine use. There are medicines that are either safe or unsafe to take during pregnancy.  Exercise only as directed by your health care provider. Experiencing uterine cramps is a good sign to stop exercising.  Continue to eat regular, healthy meals.  Wear a good support bra for breast tenderness.  Do not use hot tubs, steam rooms, or saunas.  Wear your  seat belt at all times when driving.  Avoid raw meat, uncooked cheese, cat litter boxes, and soil used by cats. These carry germs that can cause birth defects in the baby.  Take your prenatal vitamins.  Try taking a stool softener (if your health care provider approves) if you develop constipation. Eat more high-fiber foods, such as fresh vegetables or fruit and whole grains. Drink plenty of fluids to keep your urine clear or pale yellow.  Take warm sitz baths to soothe any pain or discomfort caused by hemorrhoids. Use hemorrhoid cream if your health care provider approves.  If you develop varicose veins, wear support hose. Elevate your feet for 15 minutes, 3-4 times a day. Limit salt in your diet.  Avoid heavy lifting, wear low heel shoes, and practice good posture.  Rest with your legs elevated if you have leg cramps or low back pain.  Visit your dentist if you have not gone yet during your pregnancy. Use a soft toothbrush to brush your teeth and be gentle when you floss.  A sexual relationship may be continued unless your health care provider directs you otherwise.  Continue to go to all your prenatal visits as directed by your health care provider. SEEK MEDICAL CARE IF:   You have dizziness.  You have mild pelvic cramps, pelvic pressure, or nagging pain in the abdominal area.  You have persistent nausea, vomiting, or diarrhea.  You have a bad smelling vaginal discharge.  You have pain with urination. SEEK IMMEDIATE MEDICAL CARE IF:   You have a fever.  You are leaking fluid from your vagina.  You have spotting or bleeding from your vagina.  You have severe abdominal cramping or pain.  You have rapid weight gain or loss.  You have shortness of breath with chest pain.  You notice sudden or extreme swelling of your face, hands, ankles, feet, or legs.  You have not felt your baby move in over an hour.  You have severe headaches that do not go away with  medicine.  You have vision changes. Document Released: 12/31/2000 Document Revised: 01/11/2013 Document Reviewed: 03/09/2012 ExitCare Patient Information 2015 [employer]. This information is not intended to replace advice given to you by your health care provider. Make sure you discuss any questions you have with your health care provider.  

## 2014-05-24 NOTE — Progress Notes (Signed)
Low-risk OB appointment G1P0 [id number] Estimated Date of Delivery: 11/05/14 BP 108/68 mmHg  Pulse 72  Wt 141 lb (63.957 kg)  LMP 01/29/2014  BP, weight, and urine reviewed.  Refer to obstetrical flow sheet for FH & FHR.  No fm yet. Denies cramping, lof, vb, or uti s/s. No complaints. N/V much better. Hasn't made appt w/ urologist yet- to call and make.  Reviewed warning s/s to report. Plan:  Continue routine obstetrical care  F/U in 4wks for OB appointment and anatomy u/s, going to [facility] tomorrow to find out gender 2nd IT today

## 2014-05-26 LAB — MATERNAL SCREEN, INTEGRATED #2
AFP MoM: 1.08
Alpha-Fetoprotein: 37.1 ng/mL
Crown Rump Length: 59.9 mm
DIA MOM: 0.64
DIA Value: 120.5 pg/mL
ESTRIOL UNCONJUGATED: 1.98 ng/mL
Gest. Age on Collection Date: 12.4 weeks
Gestational Age: 16.4 weeks
MATERNAL AGE AT EDD: 21.7 a
NUCHAL TRANSLUCENCY (NT): 1.3 mm
NUMBER OF FETUSES: 1
Nuchal Translucency MoM: 1
PAPP-A MoM: 0.58
PAPP-A VALUE: 611.5 ng/mL
Test Results:: NEGATIVE
WEIGHT: 139 [lb_av]
Weight: 141 [lb_av]
hCG MoM: 2.75
hCG Value: 90.4 IU/mL
uE3 MoM: 2.12

## 2014-06-16 ENCOUNTER — Other Ambulatory Visit: Payer: Self-pay | Admitting: Women's Health

## 2014-06-16 DIAGNOSIS — Z363 Encounter for antenatal screening for malformations: Secondary | ICD-10-CM

## 2014-06-16 DIAGNOSIS — O26832 Pregnancy related renal disease, second trimester: Secondary | ICD-10-CM

## 2014-06-21 ENCOUNTER — Encounter: Payer: Self-pay | Admitting: Women's Health

## 2014-06-21 ENCOUNTER — Ambulatory Visit (INDEPENDENT_AMBULATORY_CARE_PROVIDER_SITE_OTHER): Payer: Medicaid Other

## 2014-06-21 ENCOUNTER — Ambulatory Visit (INDEPENDENT_AMBULATORY_CARE_PROVIDER_SITE_OTHER): Payer: Medicaid Other | Admitting: Women's Health

## 2014-06-21 VITALS — BP 102/64 | HR 76 | Wt 143.0 lb

## 2014-06-21 DIAGNOSIS — Z3402 Encounter for supervision of normal first pregnancy, second trimester: Secondary | ICD-10-CM

## 2014-06-21 DIAGNOSIS — N289 Disorder of kidney and ureter, unspecified: Secondary | ICD-10-CM | POA: Diagnosis not present

## 2014-06-21 DIAGNOSIS — Z331 Pregnant state, incidental: Secondary | ICD-10-CM

## 2014-06-21 DIAGNOSIS — Z1389 Encounter for screening for other disorder: Secondary | ICD-10-CM

## 2014-06-21 DIAGNOSIS — O26832 Pregnancy related renal disease, second trimester: Secondary | ICD-10-CM | POA: Diagnosis not present

## 2014-06-21 DIAGNOSIS — Z363 Encounter for antenatal screening for malformations: Secondary | ICD-10-CM

## 2014-06-21 DIAGNOSIS — Z36 Encounter for antenatal screening of mother: Secondary | ICD-10-CM

## 2014-06-21 LAB — POCT URINALYSIS DIPSTICK
Glucose, UA: NEGATIVE
KETONES UA: NEGATIVE
Leukocytes, UA: NEGATIVE
Nitrite, UA: NEGATIVE
Protein, UA: NEGATIVE

## 2014-06-21 NOTE — Progress Notes (Signed)
Low-risk OB appointment G1P0 [id number] Estimated Date of Delivery: 11/05/14 BP 102/64 mmHg  Pulse 76  Wt 143 lb (64.864 kg)  LMP 01/29/2014  BP, weight, and urine reviewed.  Refer to obstetrical flow sheet for FH & FHR.  Reports good fm.  Denies regular uc's, lof, vb, or uti s/s. Bilateral hip pain at night while in bed- recommended pillow b/w knees. Some swelling in legs the other day- elevate as much as possible. Appt w/ uro 7/26.  Reviewed warning s/s to report, today's normal anatomy u/s, fm. Plan:  Continue routine obstetrical care  F/U in 4wks for OB appointment

## 2014-06-21 NOTE — Patient Instructions (Signed)
[place] Pediatricians/Family Doctors:  [facility] [phone]            [facility] [phone]                 [facility] [phone] (usually not accepting new patients unless you have family there already, you are always welcome to call and ask)            [facility] ([address] [place]) [phone]   [place] Pediatricians/Family Doctors:   [facility]: [phone]  [facility]: [phone]   Second Trimester of Pregnancy The second trimester is from week 13 through week 28, months 4 through 6. The second trimester is often a time when you feel your best. Your body has also adjusted to being pregnant, and you begin to feel better physically. Usually, morning sickness has lessened or quit completely, you may have more energy, and you may have an increase in appetite. The second trimester is also a time when the fetus is growing rapidly. At the end of the sixth month, the fetus is about 9 inches long and weighs about 1 pounds. You will likely begin to feel the baby move (quickening) between 18 and 20 weeks of the pregnancy. BODY CHANGES Your body goes through many changes during pregnancy. The changes vary from woman to woman.   Your weight will continue to increase. You will notice your lower abdomen bulging out.  You may begin to get stretch marks on your hips, abdomen, and breasts.  You may develop headaches that can be relieved by medicines approved by your health care provider.  You may urinate more often because the fetus is pressing on your bladder.  You may develop or continue to have heartburn as a result of your pregnancy.  You may develop constipation because certain hormones are causing the muscles that push waste through your intestines to slow down.  You may develop hemorrhoids or swollen, bulging veins (varicose veins).  You may have back pain because of the weight  gain and pregnancy hormones relaxing your joints between the bones in your pelvis and as a result of a shift in weight and the muscles that support your balance.  Your breasts will continue to grow and be tender.  Your gums may bleed and may be sensitive to brushing and flossing.  Dark spots or blotches (chloasma, mask of pregnancy) may develop on your face. This will likely fade after the baby is born.  A dark line from your belly button to the pubic area (linea nigra) may appear. This will likely fade after the baby is born.  You may have changes in your hair. These can include thickening of your hair, rapid growth, and changes in texture. Some women also have hair loss during or after pregnancy, or hair that feels dry or thin. Your hair will most likely return to normal after your baby is born. WHAT TO EXPECT AT YOUR PRENATAL VISITS During a routine prenatal visit:  You will be weighed to make sure you and the fetus are growing normally.  Your blood pressure will be taken.  Your abdomen will be measured to track your baby's growth.  The fetal heartbeat will be listened to.  Any test results from the previous visit will be discussed. Your health care provider may ask you:  How you are feeling.  If you are feeling the baby move.  If you have had any abnormal symptoms, such as leaking fluid, bleeding, severe   headaches, or abdominal cramping.  If you have any questions. Other tests that may be performed during your second trimester include:  Blood tests that check for:  Low iron levels (anemia).  Gestational diabetes (between 24 and 28 weeks).  Rh antibodies.  Urine tests to check for infections, diabetes, or protein in the urine.  An ultrasound to confirm the proper growth and development of the baby.  An amniocentesis to check for possible genetic problems.  Fetal screens for spina bifida and Down syndrome. HOME CARE INSTRUCTIONS   Avoid all smoking, herbs,  alcohol, and unprescribed drugs. These chemicals affect the formation and growth of the baby.  Follow your health care provider's instructions regarding medicine use. There are medicines that are either safe or unsafe to take during pregnancy.  Exercise only as directed by your health care provider. Experiencing uterine cramps is a good sign to stop exercising.  Continue to eat regular, healthy meals.  Wear a good support bra for breast tenderness.  Do not use hot tubs, steam rooms, or saunas.  Wear your seat belt at all times when driving.  Avoid raw meat, uncooked cheese, cat litter boxes, and soil used by cats. These carry germs that can cause birth defects in the baby.  Take your prenatal vitamins.  Try taking a stool softener (if your health care provider approves) if you develop constipation. Eat more high-fiber foods, such as fresh vegetables or fruit and whole grains. Drink plenty of fluids to keep your urine clear or pale yellow.  Take warm sitz baths to soothe any pain or discomfort caused by hemorrhoids. Use hemorrhoid cream if your health care provider approves.  If you develop varicose veins, wear support hose. Elevate your feet for 15 minutes, 3-4 times a day. Limit salt in your diet.  Avoid heavy lifting, wear low heel shoes, and practice good posture.  Rest with your legs elevated if you have leg cramps or low back pain.  Visit your dentist if you have not gone yet during your pregnancy. Use a soft toothbrush to brush your teeth and be gentle when you floss.  A sexual relationship may be continued unless your health care provider directs you otherwise.  Continue to go to all your prenatal visits as directed by your health care provider. SEEK MEDICAL CARE IF:   You have dizziness.  You have mild pelvic cramps, pelvic pressure, or nagging pain in the abdominal area.  You have persistent nausea, vomiting, or diarrhea.  You have a bad smelling vaginal  discharge.  You have pain with urination. SEEK IMMEDIATE MEDICAL CARE IF:   You have a fever.  You are leaking fluid from your vagina.  You have spotting or bleeding from your vagina.  You have severe abdominal cramping or pain.  You have rapid weight gain or loss.  You have shortness of breath with chest pain.  You notice sudden or extreme swelling of your face, hands, ankles, feet, or legs.  You have not felt your baby move in over an hour.  You have severe headaches that do not go away with medicine.  You have vision changes. Document Released: 12/31/2000 Document Revised: 01/11/2013 Document Reviewed: 03/09/2012 ExitCare Patient Information 2015 [employer]. This information is not intended to replace advice given to you by your health care provider. Make sure you discuss any questions you have with your health care provider.  

## 2014-06-21 NOTE — Progress Notes (Signed)
US  20+3wks measurements c/w dates,post pl gr 0, cephalic, cx 4.8cm, sdp of fluid 3.4cm,normal ov's bilat,fht 145bpm,anatomy complete w/no obvious abn seen

## 2014-07-19 ENCOUNTER — Ambulatory Visit (INDEPENDENT_AMBULATORY_CARE_PROVIDER_SITE_OTHER): Payer: Medicaid Other | Admitting: Advanced Practice Midwife

## 2014-07-19 ENCOUNTER — Encounter: Payer: Self-pay | Admitting: Advanced Practice Midwife

## 2014-07-19 VITALS — BP 110/70 | HR 76 | Wt 150.0 lb

## 2014-07-19 DIAGNOSIS — Z3402 Encounter for supervision of normal first pregnancy, second trimester: Secondary | ICD-10-CM

## 2014-07-19 DIAGNOSIS — O26833 Pregnancy related renal disease, third trimester: Secondary | ICD-10-CM

## 2014-07-19 DIAGNOSIS — Z331 Pregnant state, incidental: Secondary | ICD-10-CM

## 2014-07-19 DIAGNOSIS — Z1389 Encounter for screening for other disorder: Secondary | ICD-10-CM

## 2014-07-19 LAB — POCT URINALYSIS DIPSTICK
GLUCOSE UA: NEGATIVE
KETONES UA: NEGATIVE
LEUKOCYTES UA: NEGATIVE
Nitrite, UA: NEGATIVE
Protein, UA: NEGATIVE

## 2014-07-19 NOTE — Progress Notes (Signed)
G1P0 6270w3d Estimated Date of Delivery: 11/05/14  Blood pressure 110/70, pulse 76, weight 150 lb (68.04 kg), last menstrual period 01/29/2014.   BP weight and urine results all reviewed and noted.  Please refer to the obstetrical flow sheet for the fundal height and fetal heart rate documentation:  Patient reports good fetal movement, denies any bleeding and no rupture of membranes symptoms or regular contractions. Patient is without complaints other than normal pregnancy complaints.  Has urology appt 7/29 All questions were answered.  Plan:  Continued routine obstetrical care,   Follow up in 3 weeks for OB appointment, PN2, EFW (kidney disease)

## 2014-07-19 NOTE — Patient Instructions (Signed)

## 2014-08-11 ENCOUNTER — Ambulatory Visit (INDEPENDENT_AMBULATORY_CARE_PROVIDER_SITE_OTHER): Payer: Medicaid Other

## 2014-08-11 ENCOUNTER — Ambulatory Visit (INDEPENDENT_AMBULATORY_CARE_PROVIDER_SITE_OTHER): Payer: Medicaid Other | Admitting: Obstetrics & Gynecology

## 2014-08-11 ENCOUNTER — Encounter: Payer: Self-pay | Admitting: Obstetrics & Gynecology

## 2014-08-11 ENCOUNTER — Other Ambulatory Visit: Payer: Medicaid Other

## 2014-08-11 VITALS — BP 110/70 | HR 76 | Wt 153.0 lb

## 2014-08-11 DIAGNOSIS — N289 Disorder of kidney and ureter, unspecified: Secondary | ICD-10-CM

## 2014-08-11 DIAGNOSIS — O26833 Pregnancy related renal disease, third trimester: Secondary | ICD-10-CM | POA: Diagnosis not present

## 2014-08-11 DIAGNOSIS — Z369 Encounter for antenatal screening, unspecified: Secondary | ICD-10-CM

## 2014-08-11 DIAGNOSIS — O26832 Pregnancy related renal disease, second trimester: Secondary | ICD-10-CM

## 2014-08-11 DIAGNOSIS — Z3402 Encounter for supervision of normal first pregnancy, second trimester: Secondary | ICD-10-CM

## 2014-08-11 DIAGNOSIS — Z331 Pregnant state, incidental: Secondary | ICD-10-CM

## 2014-08-11 DIAGNOSIS — Z1389 Encounter for screening for other disorder: Secondary | ICD-10-CM

## 2014-08-11 DIAGNOSIS — Z131 Encounter for screening for diabetes mellitus: Secondary | ICD-10-CM

## 2014-08-11 LAB — POCT URINALYSIS DIPSTICK
Glucose, UA: 2
Ketones, UA: NEGATIVE
Leukocytes, UA: NEGATIVE
NITRITE UA: NEGATIVE

## 2014-08-11 MED ORDER — RANITIDINE HCL 150 MG PO TABS: 150.0000 mg | ORAL_TABLET | Freq: Two times a day (BID) | ORAL | Status: DC

## 2014-08-11 NOTE — Progress Notes (Signed)
[place] 27+5wks efw 1175 57.3%,cephalic,post pl gr 0,normal ov's bilat,fht 124bpm,sdp of fluid 5.1cm,cx appears to be closed

## 2014-08-11 NOTE — Progress Notes (Signed)
[name] Estimated Date of Delivery: 11/05/14  Blood pressure 110/70, pulse 76, weight 153 lb (69.4 kg), last menstrual period 01/29/2014.   BP weight and urine results all reviewed and noted.  Please refer to the obstetrical flow sheet for the fundal height and fetal heart rate documentation:  Patient reports good fetal movement, denies any bleeding and no rupture of membranes symptoms or regular contractions. Patient is without complaints. All questions were answered.  Plan:  Continued routine obstetrical care,   Follow up in 3 weeks for OB appointment, routine  Sonogram is reviewed and found to be normal with excellent growth and fluid no evidence of any problem from her pre-existing renal disease

## 2014-08-12 LAB — GLUCOSE TOLERANCE, 2 HOURS W/ 1HR
GLUCOSE, FASTING: 82 mg/dL (ref 65–91)
Glucose, 1 hour: 150 mg/dL (ref 65–179)
Glucose, 2 hour: 134 mg/dL (ref 65–152)

## 2014-08-12 LAB — CBC
HEMATOCRIT: 33 % — AB (ref 34.0–46.6)
HEMOGLOBIN: 11.6 g/dL (ref 11.1–15.9)
MCH: 29.9 pg (ref 26.6–33.0)
MCHC: 35.2 g/dL (ref 31.5–35.7)
MCV: 85 fL (ref 79–97)
Platelets: 225 10*3/uL (ref 150–379)
RBC: 3.88 x10E6/uL (ref 3.77–5.28)
RDW: 12.7 % (ref 12.3–15.4)
WBC: 10.7 10*3/uL (ref 3.4–10.8)

## 2014-08-12 LAB — RPR: RPR Ser Ql: NONREACTIVE

## 2014-08-12 LAB — HSV 2 ANTIBODY, IGG

## 2014-08-12 LAB — HIV ANTIBODY (ROUTINE TESTING W REFLEX): HIV SCREEN 4TH GENERATION: NONREACTIVE

## 2014-08-12 LAB — ANTIBODY SCREEN: ANTIBODY SCREEN: NEGATIVE

## 2014-08-15 ENCOUNTER — Ambulatory Visit (INDEPENDENT_AMBULATORY_CARE_PROVIDER_SITE_OTHER): Payer: Medicaid Other | Admitting: Urology

## 2014-08-15 DIAGNOSIS — Z905 Acquired absence of kidney: Secondary | ICD-10-CM

## 2014-09-04 ENCOUNTER — Encounter: Payer: Self-pay | Admitting: Women's Health

## 2014-09-04 ENCOUNTER — Ambulatory Visit (INDEPENDENT_AMBULATORY_CARE_PROVIDER_SITE_OTHER): Payer: Medicaid Other | Admitting: Women's Health

## 2014-09-04 VITALS — BP 118/74 | HR 80 | Wt 157.0 lb

## 2014-09-04 DIAGNOSIS — Z3403 Encounter for supervision of normal first pregnancy, third trimester: Secondary | ICD-10-CM

## 2014-09-04 DIAGNOSIS — Z1389 Encounter for screening for other disorder: Secondary | ICD-10-CM

## 2014-09-04 DIAGNOSIS — Z331 Pregnant state, incidental: Secondary | ICD-10-CM

## 2014-09-04 DIAGNOSIS — O1213 Gestational proteinuria, third trimester: Secondary | ICD-10-CM

## 2014-09-04 DIAGNOSIS — O26833 Pregnancy related renal disease, third trimester: Secondary | ICD-10-CM

## 2014-09-04 DIAGNOSIS — O26843 Uterine size-date discrepancy, third trimester: Secondary | ICD-10-CM

## 2014-09-04 LAB — POCT URINALYSIS DIPSTICK
Glucose, UA: NEGATIVE
Ketones, UA: NEGATIVE
LEUKOCYTES UA: NEGATIVE
Nitrite, UA: NEGATIVE

## 2014-09-04 MED ORDER — CEPHALEXIN 500 MG PO CAPS: 500.0000 mg | ORAL_CAPSULE | Freq: Every day | ORAL | Status: DC

## 2014-09-04 MED ORDER — ONDANSETRON HCL 4 MG PO TABS: 4.0000 mg | ORAL_TABLET | Freq: Three times a day (TID) | ORAL | Status: DC | PRN

## 2014-09-04 NOTE — Patient Instructions (Signed)

## 2014-09-04 NOTE — Progress Notes (Signed)
Low-risk OB appointment [name] Estimated Date of Delivery: 11/05/14 BP 118/74 mmHg  Pulse 80  Wt 157 lb (71.215 kg)  LMP 01/29/2014  BP, weight, and urine reviewed.  Refer to obstetrical flow sheet for FH & FHR.  Reports good fm.  Denies regular uc's, lof, vb, or uti s/s. N/V started back last week- doesn't vomit often, needs refill of zofran- given. Stopped keflex suppression last week b/c she ran out- refill given. 2+proteinuria today, but also large blood- will send urine cx to be safe. No reports of flank pain/kidney stone sx or vb.  Reviewed ptl s/s, fkc, pn2 results. Recommended Tdap at HD/PCP per CDC guidelines.  Plan:  Continue routine obstetrical care  F/U in 1wk for OB appointment and efw/afi u/s for s<d & pre-existing renal dz during pregnancy (last u/s had normal fluid & growth)

## 2014-09-06 LAB — URINE CULTURE: ORGANISM ID, BACTERIA: NO GROWTH

## 2014-09-10 ENCOUNTER — Encounter (HOSPITAL_COMMUNITY): Payer: Self-pay | Admitting: *Deleted

## 2014-09-10 ENCOUNTER — Inpatient Hospital Stay (HOSPITAL_COMMUNITY)
Admission: AD | Admit: 2014-09-10 | Discharge: 2014-09-10 | Disposition: A | Discharge status: Home / Self Care | Payer: Medicaid Other | Source: Ambulatory Visit | Attending: Obstetrics & Gynecology | Admitting: Obstetrics & Gynecology

## 2014-09-10 DIAGNOSIS — R32 Unspecified urinary incontinence: Secondary | ICD-10-CM | POA: Diagnosis not present

## 2014-09-10 DIAGNOSIS — N898 Other specified noninflammatory disorders of vagina: Secondary | ICD-10-CM | POA: Diagnosis not present

## 2014-09-10 DIAGNOSIS — O26893 Other specified pregnancy related conditions, third trimester: Secondary | ICD-10-CM | POA: Diagnosis present

## 2014-09-10 DIAGNOSIS — O26899 Other specified pregnancy related conditions, unspecified trimester: Secondary | ICD-10-CM | POA: Diagnosis present

## 2014-09-10 DIAGNOSIS — Z79899 Other long term (current) drug therapy: Secondary | ICD-10-CM | POA: Insufficient documentation

## 2014-09-10 DIAGNOSIS — Z3A32 32 weeks gestation of pregnancy: Secondary | ICD-10-CM | POA: Diagnosis not present

## 2014-09-10 DIAGNOSIS — Z3403 Encounter for supervision of normal first pregnancy, third trimester: Secondary | ICD-10-CM

## 2014-09-10 LAB — URINALYSIS, ROUTINE W REFLEX MICROSCOPIC
Bilirubin Urine: NEGATIVE
Glucose, UA: NEGATIVE mg/dL
Ketones, ur: NEGATIVE mg/dL
Leukocytes, UA: NEGATIVE
Nitrite: NEGATIVE
Protein, ur: 30 mg/dL — AB
Specific Gravity, Urine: 1.01 (ref 1.005–1.030)
Urobilinogen, UA: 0.2 mg/dL (ref 0.0–1.0)
pH: 6 (ref 5.0–8.0)

## 2014-09-10 LAB — URINE MICROSCOPIC-ADD ON

## 2014-09-10 NOTE — MAU Provider Note (Signed)
History   CSN: [id number]  Arrival date and time: 09/10/14 0102   None     Chief Complaint  Patient presents with  . Rupture of Membranes    HPI  Patient is 19 y.o. [name] here with complaints of clear vaginal discharge concerning for ROM. Patient states that she thought she had to go to the bathroom. When she sat on the toilet she had clear watery liquid stream out of her vagina. She does not think it is urine. Since that time she has had continued trickling of fluid.   +FM, denies gush of fluid, VB, contractions, vaginal discharge.   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 Pregnant   . Renal disorder     non-functioning left kidney    Past Surgical History  Procedure Laterality Date  . Bladder surgery      Family History  Problem Relation Age of Onset  . Depression Mother   . Hypertension Father   . Cancer Maternal Grandmother     lung  . Cancer Paternal Grandfather     testicular, lung    Social History  Substance Use Topics  . Smoking status: Never Smoker   . Smokeless tobacco: None  . Alcohol Use: No    Allergies: No Known Allergies  Prescriptions prior to admission  Medication Sig Dispense Refill Last Dose  . cephALEXin (KEFLEX) 500 MG capsule Take 1 capsule (500 mg total) by mouth at bedtime. 30 capsule 2 Past Week at Unknown time  . ondansetron (ZOFRAN) 4 MG tablet Take 1 tablet (4 mg total) by mouth every 8 (eight) hours as needed for nausea or vomiting. 20 tablet 0 09/09/2014 at Unknown time  . Pediatric Multiple Vit-C-FA (FLINSTONES GUMMIES OMEGA-3 DHA PO) Take by mouth.   09/09/2014 at Unknown time  . ranitidine (ZANTAC) 150 MG tablet Take 1 tablet (150 mg total) by mouth 2 (two) times daily. 60 tablet 3 09/09/2014 at Unknown time    Review of  Systems  Constitutional: Negative for fever.  Respiratory: Negative for shortness of breath.   Cardiovascular: Negative for chest pain.  Gastrointestinal: Negative for nausea and vomiting.  Genitourinary: Negative for dysuria.  Neurological: Negative for headaches.  Also per HPI  Physical Exam  Blood pressure 133/82, pulse 104, temperature 97.8 F (36.6 C), resp. rate 18, height  (1.676 m), weight 158 lb 12.8 oz (72.031 kg), last menstrual period 01/29/2014.  Physical Exam  Constitutional: She appears well-developed and well-nourished. No distress.  HENT:  Head: Normocephalic and atraumatic.  Eyes: EOM are normal.  Cardiovascular: Normal rate, regular rhythm, normal heart sounds and intact distal pulses.   Respiratory: Effort normal and breath sounds normal.  GI: Soft. There is no tenderness.  gravid  Genitourinary: Vaginal discharge found.  Dry pernium  Musculoskeletal: She exhibits no edema.  Neurological: She is alert.  Skin: Skin is warm and dry.   Dilation: Closed Effacement (%): Thick Station: Ballotable Exam by:: DR  PHELPS  MAU Course  Procedures - None  MDM: -speculum exam without signs of pooling -ferning negative -UA with moderate Hbg and protein (chronic finding in patients urine) -FHT reassuring and reactive; occasional contractions  Assessment and Plan  A: Patient is 19  y.o. [name] reporting vaginal discharge likely secondary to normal discharge of pregnancy and/or some urinary incontinence. No concerns at this time for PPROM or preterm labor.   P: Discharge home stable condition - Reviewed findings and my conclusion - fetal kick counts reinforced - preterm labor precautions dicussed - Handout given on PPROM - Follow-up with OB provider   Caryl Ada, DO PGY-2, [facility]   OB FELLOW MAU DISCHARGE ATTESTATION  I have seen and examined this patient; I agree with above documentation in the resident's note.   Eastyn Skalla is a 19 y.o. G1P0 reporting leakage of fluid earlier today. Small amount of fluid, no recent sex, no uti symptoms. Endorses positive fetal movement, no bleeding or contractions.  PE: BP 115/72 mmHg  Pulse 91  Temp(Src) 97.8 F (36.6 C)  Resp 18  Ht 5\' 6"  (1.676 m)  Wt 158 lb 12.8 oz (72.031 kg)  BMI 25.64 kg/m2  LMP 01/29/2014 Gen: calm comfortable, NAD Resp: normal effort, no distress Abd: gravid  ROS, labs, PMH reviewed NST reactive   Plan: - ferning and pooling negative, history not strongly suggestive of PPROM, urinalysis not suggestive of infection. Likely physiologic discharge of pregnancy. D/c home with PPROM and PTL return precautions and OB f/u tomorrow as planned.  Silvano Bilis, MD 4:08 AM

## 2014-09-10 NOTE — MAU Note (Addendum)
PT  SAYS   AT 0015-  WENT  TO B-ROOM -    AND  HAD    TRICKLE  OF  FLUID -   .  [facility]  [facility].    DENIES HSV AND MRSA.  LAST  SEX-     1 WEEK AGO

## 2014-09-10 NOTE — Discharge Instructions (Signed)
Premature Rupture and Preterm Premature Rupture of Membranes °A sac made up of membranes surrounds your baby in the womb (uterus). When this sac breaks before contractions or labor starts, it is called premature rupture of membranes (PROM). Rupture of membranes is also known as your water breaking. If this happens before 37 weeks, it is called preterm premature rupture of membranes (PPROM). PPROM is serious. It needs medical care right away. °CAUSES  °PROM may be caused by the membranes getting weak. This happens at the end of pregnancy. PPROM is often due to an infection, but can be caused by a number of other things.  °SIGNS OF PROM OR PPROM °· A sudden gush of fluid from the vagina. °· A slow leak of fluid from the vagina. °· Your underwear stay wet. °WHAT TO DO IF YOU THINK YOUR WATER BROKE °Call your doctor right away. You will need to go to the hospital to get checked right away. °WHAT HAPPENS IF YOU ARE TOLD YOU HAVE PROM OR PPROM? °You will have tests done at the hospital. If you have PROM, you may be given medicine to start labor (induced). This may happen if you are not having contractions within 24 hours of your water breaking. If you have PPROM and are not having contractions, you may be given medicine to start labor. It will depend on how far along you are in your pregnancy. °If you have PPROM, you: °· And your baby will be watched closely for signs of infection or other problems. °· May be given an antibiotic medicine. This can stop an infection from starting. °· May be given a steroid medicine. This can help the lungs to develop faster. °· May be given a medicine to stop early labor (preterm labor). °· May be told to stay in bed except to use the restroom (bed rest). °· May be given medicine to start labor. This can happen if there are problems with you or the baby. °Your treatment will depend on many factors. °Document Released: 04/04/2008 Document Revised: 09/08/2012 Document Reviewed:  04/27/2012 °ExitCare® Patient Information ©2015 [employer]. This information is not intended to replace advice given to you by your health care provider. Make sure you discuss any questions you have with your health care provider. ° °

## 2014-09-10 NOTE — MAU Note (Addendum)
Went to [facility] about 0030 and thought I had to pee. A steady stream of clear fld came out but was not urine. Slowly continues to trickle. No pain. Good FM

## 2014-09-11 ENCOUNTER — Ambulatory Visit (INDEPENDENT_AMBULATORY_CARE_PROVIDER_SITE_OTHER): Payer: Medicaid Other | Admitting: Obstetrics & Gynecology

## 2014-09-11 ENCOUNTER — Encounter: Payer: Self-pay | Admitting: Obstetrics & Gynecology

## 2014-09-11 ENCOUNTER — Ambulatory Visit (INDEPENDENT_AMBULATORY_CARE_PROVIDER_SITE_OTHER): Payer: Medicaid Other

## 2014-09-11 VITALS — BP 100/60 | HR 100 | Wt 157.0 lb

## 2014-09-11 DIAGNOSIS — Z3403 Encounter for supervision of normal first pregnancy, third trimester: Secondary | ICD-10-CM

## 2014-09-11 DIAGNOSIS — N289 Disorder of kidney and ureter, unspecified: Secondary | ICD-10-CM

## 2014-09-11 DIAGNOSIS — Z1389 Encounter for screening for other disorder: Secondary | ICD-10-CM

## 2014-09-11 DIAGNOSIS — Z331 Pregnant state, incidental: Secondary | ICD-10-CM

## 2014-09-11 DIAGNOSIS — O26843 Uterine size-date discrepancy, third trimester: Secondary | ICD-10-CM | POA: Diagnosis not present

## 2014-09-11 DIAGNOSIS — O26833 Pregnancy related renal disease, third trimester: Secondary | ICD-10-CM

## 2014-09-11 LAB — POCT URINALYSIS DIPSTICK
Blood, UA: 1
GLUCOSE UA: NEGATIVE
Ketones, UA: NEGATIVE
LEUKOCYTES UA: NEGATIVE
NITRITE UA: NEGATIVE

## 2014-09-11 NOTE — Progress Notes (Signed)
[name] Estimated Date of Delivery: 11/05/14  Blood pressure 100/60, pulse 100, weight 157 lb (71.215 kg), last menstrual period 01/29/2014.   BP weight and urine results all reviewed and noted.  Please refer to the obstetrical flow sheet for the fundal height and fetal heart rate documentation:  Patient reports good fetal movement, denies any bleeding and no rupture of membranes symptoms or regular contractions. Patient is without complaints. All questions were answered.  Plan:  Continued routine obstetrical care,   Follow up in 2 weeks for OB appointment,   Sonogram today reveals normal fluid volume and growth:  BREECH presentation

## 2014-09-11 NOTE — Progress Notes (Signed)
[place]  32+1wks,measurements c/w dates, efw 1843g 41.2%,frank breech,normal ov's bilat,post pl gr 1,afi 13cm,fht 134bpm

## 2014-09-27 ENCOUNTER — Ambulatory Visit (INDEPENDENT_AMBULATORY_CARE_PROVIDER_SITE_OTHER): Payer: Medicaid Other | Admitting: Women's Health

## 2014-09-27 ENCOUNTER — Encounter: Payer: Self-pay | Admitting: Women's Health

## 2014-09-27 VITALS — BP 108/80 | HR 84 | Wt 158.0 lb

## 2014-09-27 DIAGNOSIS — Z331 Pregnant state, incidental: Secondary | ICD-10-CM

## 2014-09-27 DIAGNOSIS — Z3403 Encounter for supervision of normal first pregnancy, third trimester: Secondary | ICD-10-CM

## 2014-09-27 DIAGNOSIS — Z1389 Encounter for screening for other disorder: Secondary | ICD-10-CM

## 2014-09-27 LAB — POCT URINALYSIS DIPSTICK
Glucose, UA: NEGATIVE
KETONES UA: NEGATIVE
LEUKOCYTES UA: NEGATIVE
NITRITE UA: NEGATIVE

## 2014-09-27 MED ORDER — ONDANSETRON 4 MG PO TBDP: 4.0000 mg | ORAL_TABLET | Freq: Three times a day (TID) | ORAL | Status: DC | PRN

## 2014-09-27 NOTE — Patient Instructions (Signed)
Call the office ([phone]) or go to [facility] if:  You begin to have strong, frequent contractions  Your water breaks.  Sometimes it is a big gush of fluid, sometimes it is just a trickle that keeps getting your panties wet or running down your legs  You have vaginal bleeding.  It is normal to have a small amount of spotting if your cervix was checked.   You don't feel your baby moving like normal.  If you don't, get you something to eat and drink and lay down and focus on feeling your baby move.  You should feel at least 10 movements in 2 hours.  If you don't, you should call the office or go to [facility].    Preterm Labor Information Preterm labor is when labor starts at less than 37 weeks of pregnancy. The normal length of a pregnancy is 39 to 41 weeks. CAUSES Often, there is no identifiable underlying cause as to why a woman goes into preterm labor. One of the most common known causes of preterm labor is infection. Infections of the uterus, cervix, vagina, amniotic sac, bladder, kidney, or even the lungs (pneumonia) can cause labor to start. Other suspected causes of preterm labor include:   Urogenital infections, such as yeast infections and bacterial vaginosis.   Uterine abnormalities (uterine shape, uterine septum, fibroids, or bleeding from the placenta).   A cervix that has been operated on (it may fail to stay closed).   Malformations in the fetus.   Multiple gestations (twins, triplets, and so on).   Breakage of the amniotic sac.  RISK FACTORS  Having a previous history of preterm labor.   Having premature rupture of membranes (PROM).   Having a placenta that covers the opening of the cervix (placenta previa).   Having a placenta that separates from the uterus (placental abruption).   Having a cervix that is too weak to hold the fetus in the uterus (incompetent cervix).   Having too much fluid in the amniotic sac (polyhydramnios).   Taking  illegal drugs or smoking while pregnant.   Not gaining enough weight while pregnant.   Being younger than 18 and older than 21 years old.   Having a low socioeconomic status.   Being African American. SYMPTOMS Signs and symptoms of preterm labor include:   Menstrual-like cramps, abdominal pain, or back pain.  Uterine contractions that are regular, as frequent as six in an hour, regardless of their intensity (may be mild or painful).  Contractions that start on the top of the uterus and spread down to the lower abdomen and back.   A sense of increased pelvic pressure.   A watery or bloody mucus discharge that comes from the vagina.  TREATMENT Depending on the length of the pregnancy and other circumstances, your health care provider may suggest bed rest. If necessary, there are medicines that can be given to stop contractions and to mature the fetal lungs. If labor happens before 34 weeks of pregnancy, a prolonged hospital stay may be recommended. Treatment depends on the condition of both you and the fetus.  WHAT SHOULD YOU DO IF YOU THINK YOU ARE IN PRETERM LABOR? Call your health care provider right away. You will need to go to the hospital to get checked immediately. HOW CAN YOU PREVENT PRETERM LABOR IN FUTURE PREGNANCIES? You should:   Stop smoking if you smoke.  Maintain healthy weight gain and avoid chemicals and drugs that are not necessary.  Be watchful for   any type of infection.  Inform your health care provider if you have a known history of preterm labor. Document Released: 03/29/2003 Document Revised: 09/08/2012 Document Reviewed: 02/09/2012 ExitCare Patient Information 2015 [employer]. This information is not intended to replace advice given to you by your health care provider. Make sure you discuss any questions you have with your health care provider.  

## 2014-09-27 NOTE — Progress Notes (Signed)
Low-risk OB appointment [name] Estimated Date of Delivery: 11/05/14 BP 108/80 mmHg  Pulse 84  Wt 158 lb (71.668 kg)  LMP 01/29/2014  BP, weight, and urine reviewed.  Refer to obstetrical flow sheet for FH & FHR.  Reports good fm.  Denies regular uc's, lof, vb, or uti s/s. N/V worse for the last week, regular zofran not helping much- odt worked better-rx sent. Able to keep food/fluids down. 1+proteinuria, on keflex suppression q hs, denies fever/chills, flank pain, uti s/s. Urine cx 3wks ago neg, had 2+ proteinuria at that time. BPs normal. U/S last visit for efw/afi normal, was breech at that time.   Reviewed ptl s/s, fkc. Plan:  Continue routine obstetrical care  F/U in 2wks for OB appointment and gbs

## 2014-10-11 ENCOUNTER — Encounter: Payer: Self-pay | Admitting: Women's Health

## 2014-10-11 ENCOUNTER — Ambulatory Visit (INDEPENDENT_AMBULATORY_CARE_PROVIDER_SITE_OTHER): Payer: Medicaid Other | Admitting: Women's Health

## 2014-10-11 VITALS — BP 118/66 | HR 84 | Wt 163.0 lb

## 2014-10-11 DIAGNOSIS — O26843 Uterine size-date discrepancy, third trimester: Secondary | ICD-10-CM

## 2014-10-11 DIAGNOSIS — Z3403 Encounter for supervision of normal first pregnancy, third trimester: Secondary | ICD-10-CM

## 2014-10-11 DIAGNOSIS — Z331 Pregnant state, incidental: Secondary | ICD-10-CM

## 2014-10-11 DIAGNOSIS — Z1389 Encounter for screening for other disorder: Secondary | ICD-10-CM

## 2014-10-11 DIAGNOSIS — O321XX1 Maternal care for breech presentation, fetus 1: Secondary | ICD-10-CM

## 2014-10-11 DIAGNOSIS — N289 Disorder of kidney and ureter, unspecified: Secondary | ICD-10-CM

## 2014-10-11 DIAGNOSIS — O321XX0 Maternal care for breech presentation, not applicable or unspecified: Secondary | ICD-10-CM | POA: Insufficient documentation

## 2014-10-11 LAB — POCT URINALYSIS DIPSTICK
GLUCOSE UA: NEGATIVE
Ketones, UA: NEGATIVE
LEUKOCYTES UA: NEGATIVE
NITRITE UA: NEGATIVE

## 2014-10-11 NOTE — Progress Notes (Signed)
Low-risk OB appointment [name] Estimated Date of Delivery: 11/05/14 BP 118/66 mmHg  Pulse 84  Wt 163 lb (73.936 kg)  LMP 01/29/2014  BP, weight, and urine reviewed.  Refer to obstetrical flow sheet for FH & FHR.  Reports good fm.  Denies regular uc's, lof, vb, or uti s/s. No complaints. Thinks baby is still breech.  Breech by Leopold's, confirmed by informal u/s Discussed ECV vs C/S, risks and benefits of both, pt states she's been doing her own research and prefers C/S Also discussed alternative methods such as moxibustion, spinning babies, diving into deep end of pool- declines Reviewed ptl s/s, fkc. Plan:  Continue routine obstetrical care  F/U in 1wk for OB appointment w/ MD to schedule c/s, gbs, and efw/afi u/s for s<d/renal dysfunction Order Nexplanon today

## 2014-10-11 NOTE — Patient Instructions (Signed)
Moxibustion for breech baby  [facility] [place] ([phone]) $120 new patients, do not file insurances  [facility] [phone])   [email or web address]  Diving into deep end of pool  Call the office [phone]) or go to [facility] if:  You begin to have strong, frequent contractions  Your water breaks.  Sometimes it is a big gush of fluid, sometimes it is just a trickle that keeps getting your panties wet or running down your legs  You have vaginal bleeding.  It is normal to have a small amount of spotting if your cervix was checked.   You don't feel your baby moving like normal.  If you don't, get you something to eat and drink and lay down and focus on feeling your baby move.  You should feel at least 10 movements in 2 hours.  If you don't, you should call the office or go to [facility].    [facility] Contractions Contractions of the uterus can occur throughout pregnancy. Contractions are not always a sign that you are in labor.  WHAT ARE BRAXTON HICKS CONTRACTIONS?  Contractions that occur before labor are called Braxton Hicks contractions, or false labor. Toward the end of pregnancy (32-34 weeks), these contractions can develop more often and may become more forceful. This is not true labor because these contractions do not result in opening (dilatation) and thinning of the cervix. They are sometimes difficult to tell apart from true labor because these contractions can be forceful and people have different pain tolerances. You should not feel embarrassed if you go to the hospital with false labor. Sometimes, the only way to tell if you are in true labor is for your health care provider to look for changes in the cervix. If there are no prenatal problems or other health problems associated with the pregnancy, it is completely safe to be sent home with false labor and await the onset of true labor. HOW CAN YOU TELL THE DIFFERENCE BETWEEN TRUE AND  FALSE LABOR? False Labor  The contractions of false labor are usually shorter and not as hard as those of true labor.   The contractions are usually irregular.   The contractions are often felt in the front of the lower abdomen and in the groin.   The contractions may go away when you walk around or change positions while lying down.   The contractions get weaker and are shorter lasting as time goes on.   The contractions do not usually become progressively stronger, regular, and closer together as with true labor.  True Labor  Contractions in true labor last 30-70 seconds, become very regular, usually become more intense, and increase in frequency.   The contractions do not go away with walking.   The discomfort is usually felt in the top of the uterus and spreads to the lower abdomen and low back.   True labor can be determined by your health care provider with an exam. This will show that the cervix is dilating and getting thinner.  WHAT TO REMEMBER  Keep up with your usual exercises and follow other instructions given by your health care provider.   Take medicines as directed by your health care provider.   Keep your regular prenatal appointments.   Eat and drink lightly if you think you are going into labor.   If Braxton Hicks contractions are making you uncomfortable:   Change your position from lying down or resting to walking, or from walking to resting.  Sit and rest in a tub of warm water.   Drink 2-3 glasses of water. Dehydration may cause these contractions.   Do slow and deep breathing several times an hour.  WHEN SHOULD I SEEK IMMEDIATE MEDICAL CARE? Seek immediate medical care if:  Your contractions become stronger, more regular, and closer together.   You have fluid leaking or gushing from your vagina.   You have a fever.   You pass blood-tinged mucus.   You have vaginal bleeding.   You have continuous abdominal pain.    You have low back pain that you never had before.   You feel your baby's head pushing down and causing pelvic pressure.   Your baby is not moving as much as it used to.  Document Released: 01/06/2005 Document Revised: 01/11/2013 Document Reviewed: 10/18/2012 [facility] Patient Information 2015 [place], [place]. This information is not intended to replace advice given to you by your health care provider. Make sure you discuss any questions you have with your health care provider.

## 2014-10-19 ENCOUNTER — Ambulatory Visit (INDEPENDENT_AMBULATORY_CARE_PROVIDER_SITE_OTHER): Payer: Medicaid Other

## 2014-10-19 ENCOUNTER — Ambulatory Visit (INDEPENDENT_AMBULATORY_CARE_PROVIDER_SITE_OTHER): Payer: Medicaid Other | Admitting: Obstetrics & Gynecology

## 2014-10-19 ENCOUNTER — Encounter: Payer: Self-pay | Admitting: Obstetrics & Gynecology

## 2014-10-19 VITALS — BP 100/70 | HR 96 | Wt 160.0 lb

## 2014-10-19 DIAGNOSIS — Z3403 Encounter for supervision of normal first pregnancy, third trimester: Secondary | ICD-10-CM

## 2014-10-19 DIAGNOSIS — N289 Disorder of kidney and ureter, unspecified: Secondary | ICD-10-CM

## 2014-10-19 DIAGNOSIS — Z3685 Encounter for antenatal screening for Streptococcus B: Secondary | ICD-10-CM

## 2014-10-19 DIAGNOSIS — Z118 Encounter for screening for other infectious and parasitic diseases: Secondary | ICD-10-CM

## 2014-10-19 DIAGNOSIS — O321XX1 Maternal care for breech presentation, fetus 1: Secondary | ICD-10-CM

## 2014-10-19 DIAGNOSIS — O26843 Uterine size-date discrepancy, third trimester: Secondary | ICD-10-CM | POA: Diagnosis not present

## 2014-10-19 DIAGNOSIS — Z331 Pregnant state, incidental: Secondary | ICD-10-CM

## 2014-10-19 DIAGNOSIS — Z1159 Encounter for screening for other viral diseases: Secondary | ICD-10-CM

## 2014-10-19 DIAGNOSIS — Z1389 Encounter for screening for other disorder: Secondary | ICD-10-CM

## 2014-10-19 LAB — POCT URINALYSIS DIPSTICK
Glucose, UA: NEGATIVE
Ketones, UA: NEGATIVE
Leukocytes, UA: NEGATIVE
NITRITE UA: NEGATIVE
PROTEIN UA: 3
RBC UA: 3

## 2014-10-19 LAB — OB RESULTS CONSOLE GBS: STREP GROUP B AG: NEGATIVE

## 2014-10-19 NOTE — Progress Notes (Signed)
[place] 37+4wks,Frank breech,EFW 2931 31.6%,post pl gr 2,afi 11.6cm,fhr 145bpm

## 2014-10-19 NOTE — Progress Notes (Signed)
[name] Estimated Date of Delivery: 11/05/14  Blood pressure 100/70, pulse 96, weight 160 lb (72.576 kg), last menstrual period 01/29/2014.   BP weight and urine results all reviewed and noted.  Please refer to the obstetrical flow sheet for the fundal height and fetal heart rate documentation:  Patient reports good fetal movement, denies any bleeding and no rupture of membranes symptoms or regular contractions. Patient is without complaints. All questions were answered.  Orders Placed This Encounter  Procedures  . Culture, beta strep (group b only)  . GC/Chlamydia Probe Amp  . POCT urinalysis dipstick    Plan:  Continued routine obstetrical care,   C section scheduled 10/10 0730 due to breech  Return in about 1 week (around 10/26/2014) for LROB, with Dr Despina Hidden.

## 2014-10-21 LAB — GC/CHLAMYDIA PROBE AMP
CHLAMYDIA, DNA PROBE: NEGATIVE
Neisseria gonorrhoeae by PCR: NEGATIVE

## 2014-10-23 LAB — CULTURE, BETA STREP (GROUP B ONLY): STREP GP B CULTURE: NEGATIVE

## 2014-10-26 ENCOUNTER — Other Ambulatory Visit: Payer: Self-pay | Admitting: Obstetrics & Gynecology

## 2014-10-26 ENCOUNTER — Ambulatory Visit (INDEPENDENT_AMBULATORY_CARE_PROVIDER_SITE_OTHER): Payer: Medicaid Other | Admitting: Obstetrics & Gynecology

## 2014-10-26 ENCOUNTER — Encounter: Payer: Self-pay | Admitting: Obstetrics & Gynecology

## 2014-10-26 VITALS — BP 110/70 | HR 132 | Wt 162.5 lb

## 2014-10-26 DIAGNOSIS — Z1389 Encounter for screening for other disorder: Secondary | ICD-10-CM

## 2014-10-26 DIAGNOSIS — Z23 Encounter for immunization: Secondary | ICD-10-CM | POA: Diagnosis not present

## 2014-10-26 DIAGNOSIS — Z3403 Encounter for supervision of normal first pregnancy, third trimester: Secondary | ICD-10-CM

## 2014-10-26 DIAGNOSIS — O321XX1 Maternal care for breech presentation, fetus 1: Secondary | ICD-10-CM

## 2014-10-26 DIAGNOSIS — Z331 Pregnant state, incidental: Secondary | ICD-10-CM

## 2014-10-26 LAB — POCT URINALYSIS DIPSTICK
GLUCOSE UA: NEGATIVE
Ketones, UA: NEGATIVE
NITRITE UA: NEGATIVE

## 2014-10-26 NOTE — Progress Notes (Signed)
[name] Estimated Date of Delivery: 11/05/14  Blood pressure 110/70, pulse 132, weight 162 lb 8 oz (73.71 kg), last menstrual period 01/29/2014.   BP weight and urine results all reviewed and noted.  Please refer to the obstetrical flow sheet for the fundal height and fetal heart rate documentation:  Patient reports good fetal movement, denies any bleeding and no rupture of membranes symptoms or regular contractions. Patient is without complaints. All questions were answered.  Orders Placed This Encounter  Procedures  . Flu Vaccine QUAD 36+ mos IM  . POCT Urinalysis Dipstick    Plan:  Continued routine obstetrical care,  Primary section scheduled 10/10 0730  Return in about 11 days (around 11/06/2014) for Post Op, with Dr Despina Hidden.

## 2014-10-27 ENCOUNTER — Encounter (HOSPITAL_COMMUNITY): Payer: Self-pay

## 2014-10-27 ENCOUNTER — Encounter (HOSPITAL_COMMUNITY)
Admission: RE | Admit: 2014-10-27 | Discharge: 2014-10-27 | Disposition: A | Discharge status: Home / Self Care | Payer: Medicaid Other | Source: Ambulatory Visit | Attending: Obstetrics & Gynecology | Admitting: Obstetrics & Gynecology

## 2014-10-27 VITALS — BP 126/95 | HR 113 | Temp 97.3°F | Resp 20 | Ht 66.0 in | Wt 164.0 lb

## 2014-10-27 DIAGNOSIS — Z01818 Encounter for other preprocedural examination: Secondary | ICD-10-CM | POA: Insufficient documentation

## 2014-10-27 DIAGNOSIS — Z3403 Encounter for supervision of normal first pregnancy, third trimester: Secondary | ICD-10-CM

## 2014-10-27 LAB — CBC
HEMATOCRIT: 35.8 % — AB (ref 36.0–46.0)
HEMOGLOBIN: 12.5 g/dL (ref 12.0–15.0)
MCH: 29.3 pg (ref 26.0–34.0)
MCHC: 34.9 g/dL (ref 30.0–36.0)
MCV: 83.8 fL (ref 78.0–100.0)
Platelets: 206 10*3/uL (ref 150–400)
RBC: 4.27 MIL/uL (ref 3.87–5.11)
RDW: 13 % (ref 11.5–15.5)
WBC: 12.2 10*3/uL — AB (ref 4.0–10.5)

## 2014-10-27 LAB — ABO/RH: ABO/RH(D): A POS

## 2014-10-27 LAB — TYPE AND SCREEN
ABO/RH(D): A POS
ANTIBODY SCREEN: NEGATIVE

## 2014-10-27 NOTE — Patient Instructions (Addendum)
   Your procedure is scheduled on: OCT 10  Enter through the Main Entrance of [facility] at: 6AM  Pick up the phone at the desk and dial [phone] and inform us of your arrival.  Please call this number if you have any problems the morning of surgery: [phone]  Remember: DO NOT EAT OR DRINK AFTER MIDNIGHT OCT 9 (SUNDAY)   Do not wear jewelry, make-up, or FINGER nail polish No metal in your hair or on your body. Do not wear lotions, powders, perfumes.  You may wear deodorant.  Do not bring valuables to the hospital. Contacts, dentures or bridgework may not be worn into surgery.  Leave suitcase in the car. After Surgery it may be brought to your room. For patients being admitted to the hospital, checkout time is 11:00am the day of discharge.

## 2014-10-28 LAB — RPR: RPR Ser Ql: NONREACTIVE

## 2014-10-29 ENCOUNTER — Encounter (HOSPITAL_COMMUNITY): Payer: Self-pay | Admitting: Anesthesiology

## 2014-10-29 NOTE — Anesthesia Preprocedure Evaluation (Addendum)
Anesthesia Evaluation  Patient identified by MRN, date of birth, ID band Patient awake    Reviewed: Allergy & Precautions, NPO status , Patient's Chart, lab work & pertinent test results  Airway Mallampati: II  TM Distance: >3 FB Neck ROM: Full    Dental no notable dental hx. (+) Teeth Intact   Pulmonary neg pulmonary ROS,    Pulmonary exam normal breath sounds clear to auscultation       Cardiovascular negative cardio ROS Normal cardiovascular exam Rhythm:Regular Rate:Normal     Neuro/Psych negative neurological ROS  negative psych ROS   GI/Hepatic Neg liver ROS, GERD  Medicated and Controlled,  Endo/Other  negative endocrine ROS  Renal/GU Renal diseaseNon-functioning left kidney   negative genitourinary   Musculoskeletal negative musculoskeletal ROS (+)   Abdominal   Peds  Hematology negative hematology ROS (+)   Anesthesia Other Findings   Reproductive/Obstetrics (+) Pregnancy Breech presentation                            Anesthesia Physical Anesthesia Plan  ASA: II  Anesthesia Plan: Spinal   Post-op Pain Management:    Induction:   Airway Management Planned: Natural Airway  Additional Equipment:   Intra-op Plan:   Post-operative Plan:   Informed Consent: I have reviewed the patients History and Physical, chart, labs and discussed the procedure including the risks, benefits and alternatives for the proposed anesthesia with the patient or authorized representative who has indicated his/her understanding and acceptance.     Plan Discussed with: Anesthesiologist, Surgeon and CRNA  Anesthesia Plan Comments:         Anesthesia Quick Evaluation

## 2014-10-30 ENCOUNTER — Inpatient Hospital Stay (HOSPITAL_COMMUNITY): Payer: Medicaid Other | Admitting: Anesthesiology

## 2014-10-30 ENCOUNTER — Inpatient Hospital Stay (HOSPITAL_COMMUNITY)
Admission: RE | Admit: 2014-10-30 | Payer: Medicaid Other | Source: Ambulatory Visit | Admitting: Obstetrics & Gynecology

## 2014-10-30 ENCOUNTER — Inpatient Hospital Stay (HOSPITAL_COMMUNITY)
Admission: AD | Admit: 2014-10-30 | Discharge: 2014-11-01 | DRG: 765 | Disposition: A | Discharge status: Home / Self Care | Payer: Medicaid Other | Source: Ambulatory Visit | Attending: Obstetrics & Gynecology | Admitting: Obstetrics & Gynecology

## 2014-10-30 ENCOUNTER — Encounter (HOSPITAL_COMMUNITY)
Admission: AD | Disposition: A | Discharge status: Home / Self Care | Payer: Self-pay | Source: Ambulatory Visit | Attending: Obstetrics & Gynecology

## 2014-10-30 ENCOUNTER — Encounter (HOSPITAL_COMMUNITY): Payer: Self-pay

## 2014-10-30 DIAGNOSIS — O26839 Pregnancy related renal disease, unspecified trimester: Secondary | ICD-10-CM

## 2014-10-30 DIAGNOSIS — N898 Other specified noninflammatory disorders of vagina: Secondary | ICD-10-CM | POA: Diagnosis present

## 2014-10-30 DIAGNOSIS — O321XX0 Maternal care for breech presentation, not applicable or unspecified: Secondary | ICD-10-CM | POA: Diagnosis present

## 2014-10-30 DIAGNOSIS — O9962 Diseases of the digestive system complicating childbirth: Secondary | ICD-10-CM | POA: Diagnosis present

## 2014-10-30 DIAGNOSIS — O26833 Pregnancy related renal disease, third trimester: Secondary | ICD-10-CM | POA: Diagnosis present

## 2014-10-30 DIAGNOSIS — Z8249 Family history of ischemic heart disease and other diseases of the circulatory system: Secondary | ICD-10-CM | POA: Diagnosis not present

## 2014-10-30 DIAGNOSIS — Z98891 History of uterine scar from previous surgery: Secondary | ICD-10-CM

## 2014-10-30 DIAGNOSIS — Z792 Long term (current) use of antibiotics: Secondary | ICD-10-CM

## 2014-10-30 DIAGNOSIS — N390 Urinary tract infection, site not specified: Secondary | ICD-10-CM | POA: Diagnosis present

## 2014-10-30 DIAGNOSIS — N289 Disorder of kidney and ureter, unspecified: Secondary | ICD-10-CM | POA: Diagnosis present

## 2014-10-30 DIAGNOSIS — Z3A39 39 weeks gestation of pregnancy: Secondary | ICD-10-CM

## 2014-10-30 DIAGNOSIS — N189 Chronic kidney disease, unspecified: Secondary | ICD-10-CM

## 2014-10-30 DIAGNOSIS — O26899 Other specified pregnancy related conditions, unspecified trimester: Secondary | ICD-10-CM

## 2014-10-30 DIAGNOSIS — K219 Gastro-esophageal reflux disease without esophagitis: Secondary | ICD-10-CM | POA: Diagnosis present

## 2014-10-30 LAB — TYPE AND SCREEN
ABO/RH(D): A POS
Antibody Screen: NEGATIVE

## 2014-10-30 LAB — POCT FERN TEST: POCT Fern Test: POSITIVE

## 2014-10-30 SURGERY — Surgical Case
Anesthesia: Spinal

## 2014-10-30 MED ORDER — PHENYLEPHRINE 8 MG IN D5W 100 ML (0.08MG/ML) PREMIX OPTIME
INJECTION | INTRAVENOUS | Status: AC
  Filled 2014-10-30: qty 100

## 2014-10-30 MED ORDER — MENTHOL 3 MG MT LOZG
1.0000 | LOZENGE | OROMUCOSAL | Status: DC | PRN
  Administered 2014-10-30: 3 mg via ORAL
  Filled 2014-10-30: qty 9

## 2014-10-30 MED ORDER — BUPIVACAINE IN DEXTROSE 0.75-8.25 % IT SOLN
INTRATHECAL | Status: DC | PRN
  Administered 2014-10-30: 1.6 mL via INTRATHECAL

## 2014-10-30 MED ORDER — DIBUCAINE 1 % RE OINT: 1.0000 "application " | TOPICAL_OINTMENT | RECTAL | Status: DC | PRN

## 2014-10-30 MED ORDER — DIPHENHYDRAMINE HCL 25 MG PO CAPS: 25.0000 mg | ORAL_CAPSULE | Freq: Four times a day (QID) | ORAL | Status: DC | PRN

## 2014-10-30 MED ORDER — KETOROLAC TROMETHAMINE 30 MG/ML IJ SOLN
30.0000 mg | Freq: Four times a day (QID) | INTRAMUSCULAR | Status: AC | PRN
Start: 2014-10-30 — End: 2014-10-31
  Administered 2014-10-30: 30 mg via INTRAVENOUS
  Filled 2014-10-30: qty 1

## 2014-10-30 MED ORDER — IBUPROFEN 600 MG PO TABS
600.0000 mg | ORAL_TABLET | Freq: Four times a day (QID) | ORAL | Status: DC | PRN
  Administered 2014-10-31 – 2014-11-01 (×2): 600 mg via ORAL

## 2014-10-30 MED ORDER — SIMETHICONE 80 MG PO CHEW: 80.0000 mg | CHEWABLE_TABLET | ORAL | Status: DC | PRN

## 2014-10-30 MED ORDER — SIMETHICONE 80 MG PO CHEW
80.0000 mg | CHEWABLE_TABLET | Freq: Three times a day (TID) | ORAL | Status: DC
  Administered 2014-11-01 (×2): 80 mg via ORAL
  Filled 2014-10-30 (×3): qty 1

## 2014-10-30 MED ORDER — DIPHENHYDRAMINE HCL 25 MG PO CAPS
25.0000 mg | ORAL_CAPSULE | ORAL | Status: DC | PRN
  Administered 2014-10-30: 25 mg via ORAL
  Filled 2014-10-30: qty 1

## 2014-10-30 MED ORDER — MEPERIDINE HCL 25 MG/ML IJ SOLN
INTRAMUSCULAR | Status: DC | PRN
  Administered 2014-10-30: 12.5 mg via INTRAVENOUS

## 2014-10-30 MED ORDER — ACETAMINOPHEN 325 MG PO TABS: 650.0000 mg | ORAL_TABLET | ORAL | Status: DC | PRN

## 2014-10-30 MED ORDER — MORPHINE SULFATE (PF) 0.5 MG/ML IJ SOLN
INTRAMUSCULAR | Status: DC | PRN
  Administered 2014-10-30: .15 mg via INTRATHECAL

## 2014-10-30 MED ORDER — CITRIC ACID-SODIUM CITRATE 334-500 MG/5ML PO SOLN
30.0000 mL | Freq: Once | ORAL | Status: DC
  Filled 2014-10-30: qty 15

## 2014-10-30 MED ORDER — ERYTHROMYCIN 5 MG/GM OP OINT
TOPICAL_OINTMENT | OPHTHALMIC | Status: AC
  Filled 2014-10-30: qty 1

## 2014-10-30 MED ORDER — NALBUPHINE HCL 10 MG/ML IJ SOLN: 5.0000 mg | Freq: Once | INTRAMUSCULAR | Status: AC | PRN

## 2014-10-30 MED ORDER — ONDANSETRON HCL 4 MG/2ML IJ SOLN
INTRAMUSCULAR | Status: AC
  Filled 2014-10-30: qty 2

## 2014-10-30 MED ORDER — SODIUM CHLORIDE 0.9 % IJ SOLN: 3.0000 mL | INTRAMUSCULAR | Status: DC | PRN

## 2014-10-30 MED ORDER — OXYCODONE-ACETAMINOPHEN 5-325 MG PO TABS
1.0000 | ORAL_TABLET | ORAL | Status: DC | PRN
  Administered 2014-10-31 – 2014-11-01 (×7): 1 via ORAL
  Filled 2014-10-30 (×7): qty 1

## 2014-10-30 MED ORDER — CEFAZOLIN SODIUM-DEXTROSE 2-3 GM-% IV SOLR: 2.0000 g | INTRAVENOUS | Status: DC

## 2014-10-30 MED ORDER — NALBUPHINE HCL 10 MG/ML IJ SOLN
INTRAMUSCULAR | Status: AC
  Filled 2014-10-30: qty 1

## 2014-10-30 MED ORDER — OXYCODONE-ACETAMINOPHEN 5-325 MG PO TABS: 2.0000 | ORAL_TABLET | ORAL | Status: DC | PRN

## 2014-10-30 MED ORDER — FAMOTIDINE IN NACL 20-0.9 MG/50ML-% IV SOLN
20.0000 mg | Freq: Once | INTRAVENOUS | Status: DC
  Filled 2014-10-30: qty 50

## 2014-10-30 MED ORDER — LACTATED RINGERS IV SOLN
INTRAVENOUS | Status: DC | PRN
  Administered 2014-10-30: 08:00:00 via INTRAVENOUS

## 2014-10-30 MED ORDER — NALBUPHINE HCL 10 MG/ML IJ SOLN: 5.0000 mg | INTRAMUSCULAR | Status: DC | PRN

## 2014-10-30 MED ORDER — FENTANYL CITRATE (PF) 100 MCG/2ML IJ SOLN
INTRAMUSCULAR | Status: DC | PRN
  Administered 2014-10-30: 25 ug via INTRAVENOUS
  Administered 2014-10-30: 25 ug via INTRATHECAL

## 2014-10-30 MED ORDER — KETOROLAC TROMETHAMINE 30 MG/ML IJ SOLN
30.0000 mg | Freq: Four times a day (QID) | INTRAMUSCULAR | Status: AC | PRN
  Administered 2014-10-30: 30 mg via INTRAMUSCULAR

## 2014-10-30 MED ORDER — CEFAZOLIN SODIUM-DEXTROSE 2-3 GM-% IV SOLR
INTRAVENOUS | Status: DC | PRN
  Administered 2014-10-30: 2 g via INTRAVENOUS

## 2014-10-30 MED ORDER — MORPHINE SULFATE (PF) 0.5 MG/ML IJ SOLN
INTRAMUSCULAR | Status: AC
  Filled 2014-10-30: qty 100

## 2014-10-30 MED ORDER — ZOLPIDEM TARTRATE 5 MG PO TABS: 5.0000 mg | ORAL_TABLET | Freq: Every evening | ORAL | Status: DC | PRN

## 2014-10-30 MED ORDER — MEPERIDINE HCL 25 MG/ML IJ SOLN
INTRAMUSCULAR | Status: AC
  Filled 2014-10-30: qty 1

## 2014-10-30 MED ORDER — SCOPOLAMINE 1 MG/3DAYS TD PT72
MEDICATED_PATCH | TRANSDERMAL | Status: AC
  Filled 2014-10-30: qty 1

## 2014-10-30 MED ORDER — METHYLERGONOVINE MALEATE 0.2 MG/ML IJ SOLN: 0.2000 mg | INTRAMUSCULAR | Status: DC | PRN

## 2014-10-30 MED ORDER — NALBUPHINE HCL 10 MG/ML IJ SOLN
5.0000 mg | Freq: Once | INTRAMUSCULAR | Status: AC | PRN
  Administered 2014-10-30: 5 mg via SUBCUTANEOUS

## 2014-10-30 MED ORDER — OXYTOCIN 40 UNITS IN LACTATED RINGERS INFUSION - SIMPLE MED: 62.5000 mL/h | INTRAVENOUS | Status: AC

## 2014-10-30 MED ORDER — LACTATED RINGERS IV SOLN
INTRAVENOUS | Status: DC
  Administered 2014-10-30: 20:00:00 via INTRAVENOUS

## 2014-10-30 MED ORDER — DIPHENHYDRAMINE HCL 50 MG/ML IJ SOLN: 12.5000 mg | INTRAMUSCULAR | Status: DC | PRN

## 2014-10-30 MED ORDER — TETANUS-DIPHTH-ACELL PERTUSSIS 5-2.5-18.5 LF-MCG/0.5 IM SUSP: 0.5000 mL | Freq: Once | INTRAMUSCULAR | Status: DC

## 2014-10-30 MED ORDER — NALBUPHINE HCL 10 MG/ML IJ SOLN
5.0000 mg | INTRAMUSCULAR | Status: DC | PRN
  Administered 2014-10-30 (×2): 5 mg via SUBCUTANEOUS
  Filled 2014-10-30: qty 1

## 2014-10-30 MED ORDER — DEXAMETHASONE SODIUM PHOSPHATE 4 MG/ML IJ SOLN
INTRAMUSCULAR | Status: DC | PRN
  Administered 2014-10-30: 4 mg via INTRAVENOUS

## 2014-10-30 MED ORDER — ONDANSETRON HCL 4 MG/2ML IJ SOLN
4.0000 mg | Freq: Once | INTRAMUSCULAR | Status: AC
  Administered 2014-10-30: 4 mg via INTRAVENOUS
  Filled 2014-10-30: qty 2

## 2014-10-30 MED ORDER — SENNOSIDES-DOCUSATE SODIUM 8.6-50 MG PO TABS
2.0000 | ORAL_TABLET | ORAL | Status: DC
  Administered 2014-10-30 – 2014-10-31 (×2): 2 via ORAL
  Filled 2014-10-30 (×2): qty 2

## 2014-10-30 MED ORDER — ONDANSETRON HCL 4 MG/2ML IJ SOLN: 4.0000 mg | Freq: Three times a day (TID) | INTRAMUSCULAR | Status: DC | PRN

## 2014-10-30 MED ORDER — CEFAZOLIN SODIUM-DEXTROSE 2-3 GM-% IV SOLR
INTRAVENOUS | Status: AC
  Filled 2014-10-30: qty 50

## 2014-10-30 MED ORDER — FENTANYL CITRATE (PF) 100 MCG/2ML IJ SOLN
INTRAMUSCULAR | Status: AC
  Filled 2014-10-30: qty 4

## 2014-10-30 MED ORDER — KETOROLAC TROMETHAMINE 30 MG/ML IJ SOLN
INTRAMUSCULAR | Status: AC
  Filled 2014-10-30: qty 1

## 2014-10-30 MED ORDER — ONDANSETRON HCL 4 MG/2ML IJ SOLN
INTRAMUSCULAR | Status: DC | PRN
  Administered 2014-10-30: 4 mg via INTRAVENOUS

## 2014-10-30 MED ORDER — NALOXONE HCL 0.4 MG/ML IJ SOLN: 0.4000 mg | INTRAMUSCULAR | Status: DC | PRN

## 2014-10-30 MED ORDER — MEPERIDINE HCL 25 MG/ML IJ SOLN: 6.2500 mg | INTRAMUSCULAR | Status: DC | PRN

## 2014-10-30 MED ORDER — IBUPROFEN 600 MG PO TABS
600.0000 mg | ORAL_TABLET | Freq: Four times a day (QID) | ORAL | Status: DC
  Administered 2014-10-30 – 2014-11-01 (×5): 600 mg via ORAL
  Filled 2014-10-30 (×7): qty 1

## 2014-10-30 MED ORDER — WITCH HAZEL-GLYCERIN EX PADS: 1.0000 "application " | MEDICATED_PAD | CUTANEOUS | Status: DC | PRN

## 2014-10-30 MED ORDER — OXYTOCIN 10 UNIT/ML IJ SOLN
40.0000 [IU] | INTRAVENOUS | Status: DC | PRN
  Administered 2014-10-30: 40 [IU] via INTRAVENOUS

## 2014-10-30 MED ORDER — SIMETHICONE 80 MG PO CHEW
80.0000 mg | CHEWABLE_TABLET | ORAL | Status: DC
  Administered 2014-10-30 – 2014-10-31 (×2): 80 mg via ORAL
  Filled 2014-10-30 (×2): qty 1

## 2014-10-30 MED ORDER — FENTANYL CITRATE (PF) 100 MCG/2ML IJ SOLN: 25.0000 ug | INTRAMUSCULAR | Status: DC | PRN

## 2014-10-30 MED ORDER — LANOLIN HYDROUS EX OINT: 1.0000 "application " | TOPICAL_OINTMENT | CUTANEOUS | Status: DC | PRN

## 2014-10-30 MED ORDER — LACTATED RINGERS IV SOLN
INTRAVENOUS | Status: DC
  Administered 2014-10-30: 07:00:00 via INTRAVENOUS

## 2014-10-30 MED ORDER — PHENYLEPHRINE 8 MG IN D5W 100 ML (0.08MG/ML) PREMIX OPTIME
INJECTION | INTRAVENOUS | Status: DC | PRN
  Administered 2014-10-30: 60 ug/min via INTRAVENOUS

## 2014-10-30 MED ORDER — PRENATAL MULTIVITAMIN CH
1.0000 | ORAL_TABLET | Freq: Every day | ORAL | Status: DC
  Administered 2014-10-31 – 2014-11-01 (×2): 1 via ORAL
  Filled 2014-10-30 (×3): qty 1

## 2014-10-30 MED ORDER — LACTATED RINGERS IV SOLN
INTRAVENOUS | Status: DC | PRN
  Administered 2014-10-30 (×2): via INTRAVENOUS

## 2014-10-30 MED ORDER — NALOXONE HCL 1 MG/ML IJ SOLN
1.0000 ug/kg/h | INTRAVENOUS | Status: DC | PRN
  Filled 2014-10-30: qty 2

## 2014-10-30 MED ORDER — SCOPOLAMINE 1 MG/3DAYS TD PT72
MEDICATED_PATCH | TRANSDERMAL | Status: DC | PRN
  Administered 2014-10-30: 1 via TRANSDERMAL

## 2014-10-30 MED ORDER — OXYTOCIN 10 UNIT/ML IJ SOLN
INTRAMUSCULAR | Status: AC
  Filled 2014-10-30: qty 4

## 2014-10-30 MED ORDER — METHYLERGONOVINE MALEATE 0.2 MG PO TABS: 0.2000 mg | ORAL_TABLET | ORAL | Status: DC | PRN

## 2014-10-30 SURGICAL SUPPLY — 34 items
CLAMP CORD UMBIL (MISCELLANEOUS) IMPLANT
CLOTH BEACON ORANGE TIMEOUT ST (SAFETY) ×3 IMPLANT
DRAPE SHEET LG 3/4 BI-LAMINATE (DRAPES) IMPLANT
DRSG OPSITE POSTOP 4X10 (GAUZE/BANDAGES/DRESSINGS) ×3 IMPLANT
DURAPREP 26ML APPLICATOR (WOUND CARE) ×6 IMPLANT
ELECT REM PT RETURN 9FT ADLT (ELECTROSURGICAL) ×3
ELECTRODE REM PT RTRN 9FT ADLT (ELECTROSURGICAL) ×1 IMPLANT
EXTRACTOR VACUUM BELL STYLE (SUCTIONS) IMPLANT
GLOVE BIOGEL PI IND STRL 8 (GLOVE) ×1 IMPLANT
GLOVE BIOGEL PI INDICATOR 8 (GLOVE) ×2
GLOVE ECLIPSE 8.0 STRL XLNG CF (GLOVE) ×3 IMPLANT
GOWN STRL REUS W/TWL LRG LVL3 (GOWN DISPOSABLE) ×6 IMPLANT
KIT ABG SYR 3ML LUER SLIP (SYRINGE) ×3 IMPLANT
LIQUID BAND (GAUZE/BANDAGES/DRESSINGS) ×3 IMPLANT
NEEDLE HYPO 18GX1.5 BLUNT FILL (NEEDLE) ×3 IMPLANT
NEEDLE HYPO 22GX1.5 SAFETY (NEEDLE) ×3 IMPLANT
NEEDLE HYPO 25X5/8 SAFETYGLIDE (NEEDLE) ×3 IMPLANT
NS IRRIG 1000ML POUR BTL (IV SOLUTION) ×3 IMPLANT
PACK C SECTION WH (CUSTOM PROCEDURE TRAY) ×3 IMPLANT
PAD OB MATERNITY 4.3X12.25 (PERSONAL CARE ITEMS) ×3 IMPLANT
PENCIL SMOKE EVAC W/HOLSTER (ELECTROSURGICAL) ×3 IMPLANT
RTRCTR C-SECT PINK 25CM LRG (MISCELLANEOUS) IMPLANT
SUT CHROMIC 0 CT 1 (SUTURE) ×3 IMPLANT
SUT MNCRL 0 VIOLET CTX 36 (SUTURE) ×2 IMPLANT
SUT MONOCRYL 0 CTX 36 (SUTURE) ×4
SUT PLAIN 2 0 (SUTURE)
SUT PLAIN 2 0 XLH (SUTURE) IMPLANT
SUT PLAIN ABS 2-0 CT1 27XMFL (SUTURE) IMPLANT
SUT VIC AB 0 CTX 36 (SUTURE) ×2
SUT VIC AB 0 CTX36XBRD ANBCTRL (SUTURE) ×1 IMPLANT
SUT VIC AB 4-0 KS 27 (SUTURE) IMPLANT
SYR 20CC LL (SYRINGE) ×6 IMPLANT
TOWEL OR 17X24 6PK STRL BLUE (TOWEL DISPOSABLE) ×3 IMPLANT
TRAY FOLEY CATH SILVER 14FR (SET/KITS/TRAYS/PACK) IMPLANT

## 2014-10-30 NOTE — Op Note (Signed)
Preoperative diagnosis:  1.  Intrauterine pregnancy at [redacted]w[redacted]d  weeks gestation                                         2.  Homero Fellers Breech                                         3.  Declines ECV   Postoperative diagnosis:  Same as above   Procedure:  Primary cesarean section  Surgeon:  Lazaro Arms MD  Assistant:    Anesthesia: Spinal  Findings:  .    Over a low transverse incision was delivered a viable female at 80 with Apgars of 9 and 9 weighing  lbs.  oz. Uterus, tubes and ovaries were all normal.  There were no other significant findings  Description of operation:  Patient was taken to the operating room and placed in the sitting position where she underwent a spinal anesthetic. She was then placed in the supine position with tilt to the left side. When adequate anesthetic level was obtained she was prepped and draped in usual sterile fashion and a Foley catheter was placed. A Pfannenstiel skin incision was made and carried down sharply to the rectus fascia which was scored in the midline extended laterally. The fascia was taken off the muscles both superiorly and without difficulty. The muscles were divided.  The peritoneal cavity was entered.  Bladder blade was placed, no bladder flap was created.  A low transverse hysterotomy incision was made and delivered a viable female  infant at 47 with Apgars of 9 and 10 weighing lbs  oz.  Cord pH was obtained and was pending. The uterus was exteriorized. It was closed in 2 layers, the first being a running interlocking layer and the second being an imbricating layer using 0 monocryl on a CTX needle. There was good resulting hemostasis. The uterus tubes and ovaries were all normal. Peritoneal cavity was irrigated vigorously. The muscles and peritoneum were reapproximated loosely. The fascia was closed using 0 Vicryl in running fashion. Subcutaneous tissue was made hemostatic and irrigated. The skin was closed using 4-0 Vicryl on a Keith needle in a  subcuticular fashion.  Dermabond was placed for additional wound integrity and to serve as a barrier. Blood loss for the procedure was 650 cc. The patient received a gram of Ancef prophylactically. The patient was taken to the recovery room in good stable condition with all counts being correct x3.  EBL 650 cc  Rumor Sun H 10/30/2014 8:18 AM

## 2014-10-30 NOTE — MAU Note (Signed)
Contractions started at 330, every 6 mins. Denies LOF. Having some spotting. +FM

## 2014-10-30 NOTE — Transfer of Care (Signed)
Immediate Anesthesia Transfer of Care Note  Patient: Charlotte Smith  Procedure(s) Performed: Procedure(s): CESAREAN SECTION (N/A)  Patient Location: PACU  Anesthesia Type:Spinal  Level of Consciousness: awake, alert , oriented and patient cooperative  Airway & Oxygen Therapy: Patient Spontanous Breathing  Post-op Assessment: Report given to RN and Post -op Vital signs reviewed and stable  Post vital signs: Reviewed and stable  Last Vitals:  Filed Vitals:   10/30/14 0550  BP: 133/90  Pulse: 112  Temp: 36.8 C  Resp: 20    Complications: No apparent anesthesia complications

## 2014-10-30 NOTE — H&P (Signed)
Preoperative History and Physical  Charlotte Smith is a 19 y.o. G1P0 with Estimated Date of Delivery: 11/05/14 by early sonogram admitted for a primary Caesarean section for breech presentation, declined ECV attempt.  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 Pregnant   . Renal disorder     non-functioning left kidney    PSH:     Past Surgical History  Procedure Laterality Date  . Bladder surgery      POb/GynH:      OB History    Gravida Para Term Preterm AB TAB SAB Ectopic Multiple Living   1               SH:   Social History  Substance Use Topics  . Smoking status: Never Smoker   . Smokeless tobacco: Never Used  . Alcohol Use: No    FH:    Family History  Problem Relation Age of Onset  . Depression Mother   . Hypertension Father   . Cancer Maternal Grandmother     lung  . Cancer Paternal Grandfather     testicular, lung     Allergies: No Known Allergies  Medications:       Current facility-administered medications:  .  citric acid-sodium citrate (ORACIT) solution 30 mL, 30 mL, Oral, Once, Leilani Able, MD .  famotidine (PEPCID) IVPB 20 mg premix, 20 mg, Intravenous, Once, Leilani Able, MD .  lactated ringers infusion, , Intravenous, Continuous, Lesly Dukes, MD, Last Rate: 125 mL/hr at 10/30/14 2130  Review of Systems:   Review of Systems  Constitutional: Negative for fever, chills, weight loss, malaise/fatigue and diaphoresis.  HENT: Negative for hearing loss, ear pain, nosebleeds, congestion, sore throat, neck pain, tinnitus and ear discharge.   Eyes: Negative for blurred vision, double vision, photophobia, pain, discharge and redness.  Respiratory: Negative for cough, hemoptysis, sputum production, shortness of  breath, wheezing and stridor.   Cardiovascular: Negative for chest pain, palpitations, orthopnea, claudication, leg swelling and PND.  Gastrointestinal: Positive for abdominal pain. Negative for heartburn, nausea, vomiting, diarrhea, constipation, blood in stool and melena.  Genitourinary: Negative for dysuria, urgency, frequency, hematuria and flank pain.  Musculoskeletal: Negative for myalgias, back pain, joint pain and falls.  Skin: Negative for itching and rash.  Neurological: Negative for dizziness, tingling, tremors, sensory change, speech change, focal weakness, seizures, loss of consciousness, weakness and headaches.  Endo/Heme/Allergies: Negative for environmental allergies and polydipsia. Does not bruise/bleed easily.  Psychiatric/Behavioral: Negative for depression, suicidal ideas, hallucinations, memory loss and substance abuse. The patient is not nervous/anxious and does not have insomnia.      PHYSICAL EXAM:  Blood pressure 133/90, pulse 112, temperature 98.2 F (36.8 C), temperature source Oral, resp. rate 20, height 5' 5.8" (1.671 m), weight 163 lb 6.4 oz (74.118 kg), last menstrual period 01/29/2014, SpO2 100 %.    Vitals reviewed. Constitutional: She is oriented to person, place, and time. She appears well-developed and well-nourished.  HENT:  Head: Normocephalic and atraumatic.  Right Ear: External ear normal.  Left Ear: External ear normal.  Nose: Nose normal.  Mouth/Throat: Oropharynx is clear and moist.  Eyes: Conjunctivae and EOM are normal. Pupils are equal, round, and reactive to light. Right eye exhibits no discharge. Left eye exhibits no  discharge. No scleral icterus.  Neck: Normal range of motion. Neck supple. No tracheal deviation present. No thyromegaly present.  Cardiovascular: Normal rate, regular rhythm, normal heart sounds and intact distal pulses.  Exam reveals no gallop and no friction rub.   No murmur heard. Respiratory: Effort normal and breath  sounds normal. No respiratory distress. She has no wheezes. She has no rales. She exhibits no tenderness.  GI: Soft. Bowel sounds are normal. She exhibits no distension and no mass. There is tenderness. There is no rebound and no guarding.  Genitourinary:       Vulva is normal without lesions Vagina is pink moist without discharge Cervix normal in appearance and pap is normal Uterus is term size Adnexa is negative with normal sized ovaries by sonogram  Musculoskeletal: Normal range of motion. She exhibits no edema and no tenderness.  Neurological: She is alert and oriented to person, place, and time. She has normal reflexes. She displays normal reflexes. No cranial nerve deficit. She exhibits normal muscle tone. Coordination normal.  Skin: Skin is warm and dry. No rash noted. No erythema. No pallor.  Psychiatric: She has a normal mood and affect. Her behavior is normal. Judgment and thought content normal.    Labs: Results for orders placed or performed during the hospital encounter of 10/30/14 (from the past 336 hour(s))  OB RESULT CONSOLE Group B Strep   Collection Time: 10/19/14 12:00 AM  Result Value Ref Range   GBS Negative   Type and screen   Collection Time: 10/30/14  6:30 AM  Result Value Ref Range   ABO/RH(D) A POS    Antibody Screen NEG    Sample Expiration 11/02/2014   Fern Test   Collection Time: 10/30/14  6:36 AM  Result Value Ref Range   POCT Fern Test Positive = ruptured amniotic membanes   Results for orders placed or performed during the hospital encounter of 10/27/14 (from the past 336 hour(s))  CBC   Collection Time: 10/27/14 10:15 AM  Result Value Ref Range   WBC 12.2 (H) 4.0 - 10.5 K/uL   RBC 4.27 3.87 - 5.11 MIL/uL   Hemoglobin 12.5 12.0 - 15.0 g/dL   HCT 78.2 (L) 95.6 - 21.3 %   MCV 83.8 78.0 - 100.0 fL   MCH 29.3 26.0 - 34.0 pg   MCHC 34.9 30.0 - 36.0 g/dL   RDW 08.6 57.8 - 46.9 %   Platelets 206 150 - 400 K/uL  RPR   Collection Time: 10/27/14 10:15  AM  Result Value Ref Range   RPR Ser Ql Non Reactive Non Reactive  Type and screen   Collection Time: 10/27/14 10:15 AM  Result Value Ref Range   ABO/RH(D) A POS    Antibody Screen NEG    Sample Expiration 10/30/2014   ABO/Rh   Collection Time: 10/27/14 10:15 AM  Result Value Ref Range   ABO/RH(D) A POS   Results for orders placed or performed in visit on 10/26/14 (from the past 336 hour(s))  POCT Urinalysis Dipstick   Collection Time: 10/26/14 11:49 AM  Result Value Ref Range   Color, UA     Clarity, UA     Glucose, UA neg    Bilirubin, UA     Ketones, UA neg    Spec Grav, UA     Blood, UA 3+    pH, UA     Protein, UA 4+    Urobilinogen, UA     Nitrite, UA neg  Leukocytes, UA Trace (A) Negative  Results for orders placed or performed in visit on 10/19/14 (from the past 336 hour(s))  POCT urinalysis dipstick   Collection Time: 10/19/14 11:50 AM  Result Value Ref Range   Color, UA     Clarity, UA     Glucose, UA neg    Bilirubin, UA     Ketones, UA neg    Spec Grav, UA     Blood, UA 3    pH, UA     Protein, UA 3    Urobilinogen, UA     Nitrite, UA neg    Leukocytes, UA Negative Negative  Culture, beta strep (group b only)   Collection Time: 10/19/14  3:00 PM  Result Value Ref Range   Strep Gp B Culture Negative Negative  GC/Chlamydia Probe Amp   Collection Time: 10/19/14  3:00 PM  Result Value Ref Range   Chlamydia trachomatis, NAA Negative Negative   Neisseria gonorrhoeae by PCR Negative Negative    EKG: No orders found for this or any previous visit.  Imaging Studies: US Ob Follow Up  10/19/2014   FOLLOW UP SONOGRAM   Charlotte Smith is in the office for a follow up sonogram for EFW.  She is a 19 y.o. year old G1P0 with Estimated Date of Delivery: 11/05/14  by early ultrasound now at  [redacted]w[redacted]d weeks gestation. Thus far the pregnancy  has been complicated by small for dates,renal dz.during pregnancy.  GESTATION: SINGLETON  PRESENTATION: breech frank  FETAL  ACTIVITY:          Heart rate         145 bpm          The fetus is active.  AMNIOTIC FLUID: The amniotic fluid volume is  normal, 11.6 cm.  PLACENTA LOCALIZATION:  posterior GRADE 2  CERVIX: Limited view  ADNEXA:. wnl  GESTATIONAL AGE AND  BIOMETRICS:  Gestational criteria: Estimated Date of Delivery: 11/05/14 by early  ultrasound now at [redacted]w[redacted]d  Previous Scans:5              BIPARIETAL DIAMETER           8.85 cm         35+5 weeks  HEAD CIRCUMFERENCE           32.85 cm         37+2 weeks  ABDOMINAL CIRCUMFERENCE           31.49 cm         35+3 weeks  FEMUR LENGTH           7.37 cm         37+5 weeks                                                           AVERAGE EGA(BY THIS SCAN):   36+4 weeks                                                 ESTIMATED FETAL WEIGHT:        2931  grams, 31.6 % ANATOMICAL SURVEY  COMMENTS CEREBRAL VENTRICLES yes normal   CHOROID PLEXUS yes normal   CEREBELLUM     CISTERNA MAGNA     NUCHAL REGION     ORBITS     NASAL BONE     NOSE/LIP     FACIAL PROFILE     4 CHAMBERED HEART     OUTFLOW TRACTS     DIAPHRAGM yes normal   STOMACH yes normal   RENAL REGION yes normal   BLADDER yes normal   CORD INSERTION     3 VESSEL CORD yes normal   SPINE     ARMS/HANDS     LEGS/FEET     GENITALIA           SUSPECTED ABNORMALITIES:  no  QUALITY OF SCAN: satisfactory  TECHNICIAN COMMENTS:  [place] 37+4wks,Frank breech,EFW 2931 31.6%,post pl gr 2,afi 11.6cm,fhr 145bpm        A copy of this report including all images has been saved and backed up to  a second source for retrieval if needed. All measures and details of the  anatomical scan, placentation, fluid volume and pelvic anatomy are  contained in that report.  Charlotte Smith 10/19/2014 11:38 AM         Assessment: Estimated Date of Delivery: 11/05/14 [redacted]w[redacted]d  Breech presentation, declined ECV attempt  Patient Active Problem List   Diagnosis Date Noted  . Breech presentation  10/11/2014  . Vaginal discharge in pregnancy 09/10/2014  . Supervision of normal first pregnancy 03/27/2014  . Renal disease in pregnancy 03/13/2014    Plan: Primary caesarean section  Charlotte Smith H 10/30/2014 7:24 AM

## 2014-10-30 NOTE — Anesthesia Postprocedure Evaluation (Signed)
  Anesthesia Post-op Note  Patient: Charlotte Smith  Procedure(s) Performed: Procedure(s): CESAREAN SECTION (N/A)  Patient Location: Mother/Baby  Anesthesia Type:Spinal  Level of Consciousness: awake, alert , oriented and patient cooperative  Airway and Oxygen Therapy: Patient Spontanous Breathing  Post-op Pain: none  Post-op Assessment: Post-op Vital signs reviewed, Patient's Cardiovascular Status Stable, Respiratory Function Stable, Patent Airway, No headache, No backache and Patient able to bend at knees  Post-op Vital Signs: Reviewed and stable  Last Vitals:  Filed Vitals:   10/30/14 1230  BP: 138/90  Pulse: 77  Temp:   Resp: 18    Complications: No apparent anesthesia complications

## 2014-10-30 NOTE — Lactation Note (Signed)
This note was copied from the chart of Girl [name]. Lactation Consultation Note  Patient Name: Girl Briggitte Boline Today's Date: 10/30/2014 Reason for consult: Initial assessment Baby 6 hours old. Baby born breech. Assisted mom to reposition herself and latch baby in football position on right breast. Baby has gentle suckle with no swallows noted. Let baby suckle this LC's gloved finger, and baby has gentle, rhythmical suckle. Mom states that she has colostrum and is able to hand express. Mom reports increased comfort with football position, but mom is very sleepy. Enc FOB to hold infant after this BF and let mom get some rest. Discussed nursing with cues and enc calling for assistance with latching as needed. Mom given [facility] brochure, aware of OP/BFSG, community resources, and [facility] phone line assistance after D/C. Mom asking about use of personal DEBP. Enc mom to focus on BF and discuss how to introduce pumping when she is more awake.  Maternal Data Has patient been taught Hand Expression?: Yes (Per mom.) Does the patient have breastfeeding experience prior to this delivery?: No  Feeding Feeding Type: Breast Fed Length of feed:  (LC assessed first 10 minutes of BF. )  LATCH Score/Interventions Latch: Repeated attempts needed to sustain latch, nipple held in mouth throughout feeding, stimulation needed to elicit sucking reflex. Intervention(s): Adjust position;Assist with latch;Breast compression  Audible Swallowing: None Intervention(s): Skin to skin;Hand expression  Type of Nipple: Everted at rest and after stimulation  Comfort (Breast/Nipple): Soft / non-tender     Hold (Positioning): Assistance needed to correctly position infant at breast and maintain latch. Intervention(s): Breastfeeding basics reviewed;Support Pillows;Position options;Skin to skin  LATCH Score: 6  Lactation Tools Discussed/Used     Consult Status Consult Status: Follow-up Date:  10/31/14 Follow-up type: In-patient    Geralynn Ochs 10/30/2014, 2:41 PM

## 2014-10-30 NOTE — Anesthesia Procedure Notes (Signed)
Spinal Patient location during procedure: OR Start time: 10/30/2014 7:38 AM Staffing Anesthesiologist: Mal Amabile Performed by: anesthesiologist  Preanesthetic Checklist Completed: patient identified, site marked, surgical consent, pre-op evaluation, timeout performed, IV checked, risks and benefits discussed and monitors and equipment checked Spinal Block Patient position: sitting Prep: site prepped and draped and DuraPrep Patient monitoring: heart rate, cardiac monitor, continuous pulse ox and blood pressure Approach: midline Location: L3-4 Injection technique: single-shot Needle Needle type: Sprotte  Needle gauge: 24 G Needle length: 9 cm Needle insertion depth: 5 cm Assessment Sensory level: T4 Additional Notes Patient tolerated procedure well. Adequate sensory level.

## 2014-10-30 NOTE — Anesthesia Postprocedure Evaluation (Signed)
  Anesthesia Post-op Note  Patient: Charlotte Smith  Procedure(s) Performed: Procedure(s): CESAREAN SECTION (N/A)  Patient Location: PACU  Anesthesia Type:Spinal  Level of Consciousness: awake, alert  and oriented  Airway and Oxygen Therapy: Patient Spontanous Breathing  Post-op Pain: none  Post-op Assessment: Post-op Vital signs reviewed, Patient's Cardiovascular Status Stable, Respiratory Function Stable, Patent Airway, No signs of Nausea or vomiting, Pain level controlled, No headache, No backache and Spinal receding LLE Motor Response: No movement due to regional block LLE Sensation: Numbness RLE Motor Response: No movement due to regional block RLE Sensation: Numbness      Post-op Vital Signs: Reviewed and stable  Last Vitals:  Filed Vitals:   10/30/14 0945  BP: 121/74  Pulse: 87  Temp:   Resp: 18    Complications: No apparent anesthesia complications

## 2014-10-31 ENCOUNTER — Encounter (HOSPITAL_COMMUNITY): Payer: Self-pay | Admitting: Obstetrics & Gynecology

## 2014-10-31 LAB — CBC
HCT: 24.7 % — ABNORMAL LOW (ref 36.0–46.0)
HEMOGLOBIN: 8.4 g/dL — AB (ref 12.0–15.0)
MCH: 29.2 pg (ref 26.0–34.0)
MCHC: 34 g/dL (ref 30.0–36.0)
MCV: 85.8 fL (ref 78.0–100.0)
PLATELETS: 150 10*3/uL (ref 150–400)
RBC: 2.88 MIL/uL — AB (ref 3.87–5.11)
RDW: 13.4 % (ref 11.5–15.5)
WBC: 15.3 10*3/uL — ABNORMAL HIGH (ref 4.0–10.5)

## 2014-10-31 NOTE — Progress Notes (Signed)
Subjective: Postpartum Day 1: Cesarean Delivery Patient reports + flatus and no problems voiding.    Objective: Vital signs in last 24 hours: Temp:  [97.6 F (36.4 C)-98.7 F (37.1 C)] 98.7 F (37.1 C) (10/11 0400) Pulse Rate:  [77-135] 92 (10/11 0400) Resp:  [12-29] 18 (10/11 0400) BP: (99-145)/(63-98) 121/70 mmHg (10/11 0400) SpO2:  [98 %-100 %] 98 % (10/11 0400)  Physical Exam:  General: alert, cooperative and appears stated age 19: appropriate Uterine Fundus: firm Incision: no significant drainage DVT Evaluation: No evidence of DVT seen on physical exam.   Recent Labs  10/31/14 0534  HGB 8.4*  HCT 24.7*    Assessment/Plan: Status post Cesarean section. Doing well postoperatively.  Continue current care.  Randel Books MS 3 10/31/2014, 8:01 AM  I examined and discussed this patient w/ this student. I agree with the above. Kathee Delton, MD,MS,  PGY2 10/31/2014 9:25 AM   OB fellow attestation Post Partum Day 1/POD#1 from pLTCS I have seen and examined this patient and agree with above documentation in the resident and Medical Student's note.   Charlotte Smith is a 19 y.o. G1P1001 s/p pLTCS for Breech.  Pt denies problems with ambulating, voiding or po intake. Pain is well controlled.  Plan for birth control is Nexplanon.  Method of Feeding: Breast- needing to supplement a little formula.   PE:  BP 122/72 mmHg  Pulse 88  Temp(Src) 98.1 F (36.7 C) (Oral)  Resp 18  Ht 5' 5.8" (1.671 m)  Wt 163 lb 6.4 oz (74.118 kg)  BMI 26.54 kg/m2  SpO2 98%  LMP 01/29/2014  Breastfeeding? Unknown Gen: well appearing Heart: reg rate Lungs: normal WOB Fundus firm Ext: soft, no pain, no edema Incision: clean/dry/intact.   Plan for discharge: POD#2 or 3, likely #3. Continue routine post operative care.   Federico Flake, MD 4:19 PM

## 2014-11-01 MED ORDER — OXYCODONE-ACETAMINOPHEN 5-325 MG PO TABS: 1.0000 | ORAL_TABLET | Freq: Four times a day (QID) | ORAL | Status: DC | PRN

## 2014-11-01 MED ORDER — IBUPROFEN 600 MG PO TABS: 600.0000 mg | ORAL_TABLET | Freq: Four times a day (QID) | ORAL | Status: DC | PRN

## 2014-11-01 MED ORDER — SENNOSIDES-DOCUSATE SODIUM 8.6-50 MG PO TABS: 2.0000 | ORAL_TABLET | ORAL | Status: DC

## 2014-11-01 NOTE — Discharge Instructions (Signed)
Postpartum Care After Cesarean Delivery After you deliver your newborn (postpartum period), the usual stay in the hospital is 24-72 hours. If there were problems with your labor or delivery, or if you have other medical problems, you might be in the hospital longer.  While you are in the hospital, you will receive help and instructions on how to care for yourself and your newborn during the postpartum period.  While you are in the hospital:  It is normal for you to have pain or discomfort from the incision in your abdomen. Be sure to tell your nurses when you are having pain, where the pain is located, and what makes the pain worse.  If you are breastfeeding, you may feel uncomfortable contractions of your uterus for a couple of weeks. This is normal. The contractions help your uterus get back to normal size.  It is normal to have some bleeding after delivery.  For the first 1-3 days after delivery, the flow is red and the amount may be similar to a period.  It is common for the flow to start and stop.  In the first few days, you may pass some small clots. Let your nurses know if you begin to pass large clots or your flow increases.  Do not  flush blood clots down the toilet before having the nurse look at them.  During the next 3-10 days after delivery, your flow should become more watery and pink or brown-tinged in color.  Ten to fourteen days after delivery, your flow should be a small amount of yellowish-white discharge.  The amount of your flow will decrease over the first few weeks after delivery. Your flow may stop in 6-8 weeks. Most women have had their flow stop by 12 weeks after delivery.  You should change your sanitary pads frequently.  Wash your hands thoroughly with soap and water for at least 20 seconds after changing pads, using the toilet, or before holding or feeding your newborn.  Your intravenous (IV) tubing will be removed when you are drinking enough fluids.  The  urine drainage tube (urinary catheter) that was inserted before delivery may be removed within 6-8 hours after delivery or when feeling returns to your legs. You should feel like you need to empty your bladder within the first 6-8 hours after the catheter has been removed.  In case you become weak, lightheaded, or faint, call your nurse before you get out of bed for the first time and before you take a shower for the first time.  Within the first few days after delivery, your breasts may begin to feel tender and full. This is called engorgement. Breast tenderness usually goes away within 48-72 hours after engorgement occurs. You may also notice milk leaking from your breasts. If you are not breastfeeding, do not stimulate your breasts. Breast stimulation can make your breasts produce more milk.  Spending as much time as possible with your newborn is very important. During this time, you and your newborn can feel close and get to know each other. Having your newborn stay in your room (rooming in) will help to strengthen the bond with your newborn. It will give you time to get to know your newborn and become comfortable caring for your newborn.  Your hormones change after delivery. Sometimes the hormone changes can temporarily cause you to feel sad or tearful. These feelings should not last more than a few days. If these feelings last longer than that, you should talk to your  caregiver. °· If desired, talk to your caregiver about methods of family planning or contraception. °· Talk to your caregiver about immunizations. Your caregiver may want you to have the following immunizations before leaving the hospital: °· Tetanus, diphtheria, and pertussis (Tdap) or tetanus and diphtheria (Td) immunization. It is very important that you and your family (including grandparents) or others caring for your newborn are up-to-date with the Tdap or Td immunizations. The Tdap or Td immunization can help protect your newborn  from getting ill. °· Rubella immunization. °· Varicella (chickenpox) immunization. °· Influenza immunization. You should receive this annual immunization if you did not receive the immunization during your pregnancy. °  °This information is not intended to replace advice given to you by your health care provider. Make sure you discuss any questions you have with your health care provider. °  °Document Released: 10/01/2011 Document Reviewed: 10/01/2011 °Elsevier Interactive Patient Education ©2016 [employer] ° °Iron-Rich Diet °Iron is a mineral that helps your body to produce hemoglobin. Hemoglobin is a protein in your red blood cells that carries oxygen to your body's tissues. Eating too little iron may cause you to feel weak and tired, and it can increase your risk for infection. Eating enough iron is necessary for your body's metabolism, muscle function, and nervous system. °Iron is naturally found in many foods. It can also be added to foods or fortified in foods. There are two types of dietary iron: °· Heme iron. Heme iron is absorbed by the body more easily than nonheme iron. Heme iron is found in meat, poultry, and fish. °· Nonheme iron. Nonheme iron is found in dietary supplements, iron-fortified grains, beans, and vegetables. °You may need to follow an iron-rich diet if: °· You have been diagnosed with iron deficiency or iron-deficiency anemia. °· You have a condition that prevents you from absorbing dietary iron, such as: °¨ Infection in your intestines. °¨ Celiac disease. This involves long-lasting (chronic) inflammation of your intestines. °· You do not eat enough iron. °· You eat a diet that is high in foods that impair iron absorption. °· You have lost a lot of blood. °· You have heavy bleeding during your menstrual cycle. °· You are pregnant. °WHAT IS MY PLAN? °Your health care provider may help you to determine how much iron you need per day based on your condition. Generally, when a person  consumes sufficient amounts of iron in the diet, the following iron needs are met: °· Men. °¨ 14-18 years old: 11 mg per day. °¨ 19-50 years old: 8 mg per day. °· Women.   °¨ 14-18 years old: 15 mg per day. °¨ 19-50 years old: 18 mg per day. °¨ Over 50 years old: 8 mg per day. °¨ Pregnant women: 27 mg per day. °¨ Breastfeeding women: 9 mg per day. °WHAT DO I NEED TO KNOW ABOUT AN IRON-RICH DIET? °· Eat fresh fruits and vegetables that are high in vitamin C along with foods that are high in iron. This will help increase the amount of iron that your body absorbs from food, especially with foods containing nonheme iron. Foods that are high in vitamin C include oranges, peppers, tomatoes, and mango. °· Take iron supplements only as directed by your health care provider. Overdose of iron can be life-threatening. If you were prescribed iron supplements, take them with orange juice or a vitamin C supplement. °· Cook foods in pots and pans that are made from iron.   °· Eat nonheme iron-containing foods alongside foods that   are high in heme iron. This helps to improve your iron absorption.   °· Certain foods and drinks contain compounds that impair iron absorption. Avoid eating these foods in the same meal as iron-rich foods or with iron supplements. These include: °¨ Coffee, black tea, and red wine. °¨ Milk, dairy products, and foods that are high in calcium. °¨ Beans, soybeans, and peas. °¨ Whole grains. °· When eating foods that contain both nonheme iron and compounds that impair iron absorption, follow these tips to absorb iron better.   °¨ Soak beans overnight before cooking. °¨ Soak whole grains overnight and drain them before using. °¨ Ferment flours before baking, such as using yeast in bread dough. °WHAT FOODS CAN I EAT? °Grains  °Iron-fortified breakfast cereal. Iron-fortified whole-wheat bread. Enriched rice. Sprouted grains. °Vegetables  °Spinach. Potatoes with skin. Green peas. Broccoli. Red and green bell  peppers. Fermented vegetables. °Fruits  °Prunes. Raisins. Oranges. Strawberries. Mango. Grapefruit. °Meats and Other Protein Sources  °Beef liver. Oysters. Beef. Shrimp. Turkey. Chicken. Tuna. Sardines. Chickpeas. Nuts. Tofu. °Beverages  °Tomato juice. Fresh orange juice. Prune juice. Hibiscus tea. Fortified instant breakfast shakes. °Condiments  °Tahini. Fermented soy sauce.  °Sweets and Desserts  °Black-strap molasses.  °Other  °Wheat germ. °The items listed above may not be a complete list of recommended foods or beverages. Contact your dietitian for more options.  °WHAT FOODS ARE NOT RECOMMENDED? °Grains  °Whole grains. Bran cereal. Bran flour. Oats. °Vegetables  °Artichokes. Brussels sprouts. Kale. °Fruits  °Blueberries. Raspberries. Strawberries. Figs. °Meats and Other Protein Sources  °Soybeans. Products made from soy protein. °Dairy  °Milk. Cream. Cheese. Yogurt. Cottage cheese. °Beverages  °Coffee. Black tea. Red wine. °Sweets and Desserts  °Cocoa. Chocolate. Ice cream. °Other  °Basil. Oregano. Parsley. °The items listed above may not be a complete list of foods and beverages to avoid. Contact your dietitian for more information.  °  °This information is not intended to replace advice given to you by your health care provider. Make sure you discuss any questions you have with your health care provider. °  °Document Released: 08/20/2004 Document Revised: 01/27/2014 Document Reviewed: 08/03/2013 °Elsevier Interactive Patient Education ©2016 Elsevier Inc. ° °

## 2014-11-01 NOTE — Lactation Note (Signed)
This note was copied from the chart of Charlotte Geradine GirtVictoria Smith. Lactation Consultation Note  Mom reports that she has been BF at each feeding but that the baby falls asleep within 2-3 minutes.  They also report that the baby has to be stimulated when bottle feeding. Encouraged mom to  Continue putting baby to the breast at each feeding but to end the feeding and offer formula or expressed BM if baby was asleep and could not be woken.  Mom instructed to pump every 2-3 hours for 15-20 minutes and to follow-up with lactation Friday at 4 pm. Patient Name: Charlotte Smith Today's Date: 11/01/2014     Maternal Data    Feeding    LATCH Score/Interventions                      Lactation Tools Discussed/Used     Consult Status      Soyla DryerJoseph, Torrey Horseman 11/01/2014, 2:37 PM

## 2014-11-01 NOTE — Discharge Summary (Signed)
OB Discharge Summary     Patient Name: Charlotte Smith DOB: [birth year] MRN: [id number]  Date of admission: 10/30/2014 Delivering MD: Duane LopeEURE, LUTHER H   Date of discharge: 11/01/2014  Admitting diagnosis: 39 WEEKS CTX  BREECH Intrauterine pregnancy: 5264w1d     Secondary diagnosis:  Principal Problem:   S/P cesarean section Active Problems:   Renal disease in pregnancy   Vaginal discharge in pregnancy   Breech presentation     Discharge diagnosis: Term Pregnancy Delivered                                                                                                Post partum procedures:none  Augmentation: na  Complications: None  Hospital course:  Sceduled C/S   19 y.o. yo G1P1001 at 2264w1d was admitted to the hospital 10/30/2014 for scheduled cesarean section with the following indication:Breech presentation.  Membrane Rupture Time/Date: 6:15 AM ,10/30/2014   Patient delivered a Viable infant.10/30/2014  Details of operation can be found in separate operative note.  Pateint had an uncomplicated postpartum course.  She is ambulating, tolerating a regular diet, passing flatus, and urinating well. Patient is discharged home in stable condition on 11/01/2014.   Physical exam  Filed Vitals:   10/31/14 0400 10/31/14 0900 10/31/14 1820 11/01/14 0540  BP: 121/70 122/72 127/79 126/78  Pulse: 92 88 102 108  Temp: 98.7 F (37.1 C) 98.1 F (36.7 C) 98 F (36.7 C) 97.7 F (36.5 C)  TempSrc: Oral Oral Oral   Resp: 18 18 18 18   Height:      Weight:      SpO2: 98%      General: alert, cooperative and no distress  Lungs: normal WOB Lochia: appropriate Uterine Fundus: firm Incision: Healing well with no significant drainage, No significant erythema, Dressing is clean, dry, and intact DVT Evaluation: No evidence of DVT seen on physical exam. Labs: Lab Results  Component Value Date   WBC 15.3* 10/31/2014   HGB 8.4* 10/31/2014   HCT 24.7* 10/31/2014   MCV 85.8 10/31/2014   PLT 150 10/31/2014   CMP Latest Ref Rng 04/12/2014  Glucose 70 - 99 mg/dL 89  BUN 6 - 23 mg/dL 13  Creatinine 4.090.50 - 8.111.10 mg/dL 9.140.71  Sodium 782135 - 956145 mmol/L 133(L)  Potassium 3.5 - 5.1 mmol/L 3.4(L)  Chloride 96 - 112 mmol/L 98  CO2 19 - 32 mmol/L 23  Calcium 8.4 - 10.5 mg/dL 9.6  Total Protein 6.0 - 8.5 g/dL -  Total Bilirubin 0.0 - 1.2 mg/dL -  Alkaline Phos 39 - 213117 IU/L -  AST 0 - 40 IU/L -  ALT 0 - 32 IU/L -    Discharge instruction: per After Visit Summary and "Baby and Me Booklet".  Medications:  Current facility-administered medications:  .  acetaminophen (TYLENOL) tablet 650 mg, 650 mg, Oral, Q4H PRN, Lazaro ArmsLuther H Eure, MD .  witch hazel-glycerin (TUCKS) pad 1 application, 1 application, Topical, PRN **AND** dibucaine (NUPERCAINAL) 1 % rectal ointment 1 application, 1 application, Rectal, PRN, Lazaro ArmsLuther H Eure, MD .  diphenhydrAMINE (BENADRYL) capsule 25 mg, 25 mg, Oral, Q6H  PRN, Lazaro Arms, MD .  diphenhydrAMINE (BENADRYL) injection 12.5 mg, 12.5 mg, Intravenous, Q4H PRN **OR** diphenhydrAMINE (BENADRYL) capsule 25 mg, 25 mg, Oral, Q4H PRN, Mal Amabile, MD, 25 mg at 10/30/14 1956 .  ibuprofen (ADVIL,MOTRIN) tablet 600 mg, 600 mg, Oral, 4 times per day, Lazaro Arms, MD, 600 mg at 10/31/14 1728 .  ibuprofen (ADVIL,MOTRIN) tablet 600 mg, 600 mg, Oral, Q6H PRN, Mal Amabile, MD, 600 mg at 11/01/14 0540 .  lactated ringers infusion, , Intravenous, Continuous, Lazaro Arms, MD, Last Rate: 125 mL/hr at 10/30/14 1955 .  lanolin ointment 1 application, 1 application, Topical, PRN, Lazaro Arms, MD .  menthol-cetylpyridinium (CEPACOL) lozenge 3 mg, 1 lozenge, Oral, Q2H PRN, Lazaro Arms, MD, 3 mg at 10/30/14 2017 .  methylergonovine (METHERGINE) tablet 0.2 mg, 0.2 mg, Oral, Q4H PRN **OR** methylergonovine (METHERGINE) injection 0.2 mg, 0.2 mg, Intramuscular, Q4H PRN, Lazaro Arms, MD .  nalbuphine (NUBAIN) injection 5 mg, 5 mg, Intravenous, Q4H PRN **OR** nalbuphine (NUBAIN)  injection 5 mg, 5 mg, Subcutaneous, Q4H PRN, Mal Amabile, MD, 5 mg at 10/30/14 1356 .  naloxone (NARCAN) 2 mg in dextrose 5 % 250 mL infusion, 1-4 mcg/kg/hr, Intravenous, Continuous PRN, Mal Amabile, MD .  naloxone [facility]) injection 0.4 mg, 0.4 mg, Intravenous, PRN **AND** sodium chloride 0.9 % injection 3 mL, 3 mL, Intravenous, PRN, Mal Amabile, MD .  ondansetron [facility]) injection 4 mg, 4 mg, Intravenous, Q8H PRN, Mal Amabile, MD .  oxyCODONE-acetaminophen (PERCOCET/ROXICET) 5-325 MG per tablet 1 tablet, 1 tablet, Oral, Q4H PRN, Lazaro Arms, MD, 1 tablet at 11/01/14 0540 .  oxyCODONE-acetaminophen (PERCOCET/ROXICET) 5-325 MG per tablet 2 tablet, 2 tablet, Oral, Q4H PRN, Lazaro Arms, MD .  prenatal multivitamin tablet 1 tablet, 1 tablet, Oral, Q1200, Lazaro Arms, MD, 1 tablet at 10/31/14 1124 .  senna-docusate (Senokot-S) tablet 2 tablet, 2 tablet, Oral, Q24H, Lazaro Arms, MD, 2 tablet at 10/31/14 2300 .  simethicone (MYLICON) chewable tablet 80 mg, 80 mg, Oral, TID PC, Lazaro Arms, MD .  simethicone [facility]) chewable tablet 80 mg, 80 mg, Oral, Q24H, Lazaro Arms, MD, 80 mg at 10/31/14 2300 .  simethicone (MYLICON) chewable tablet 80 mg, 80 mg, Oral, PRN, Lazaro Arms, MD .  Tdap (BOOSTRIX) injection 0.5 mL, 0.5 mL, Intramuscular, Once, Lazaro Arms, MD .  zolpidem (AMBIEN) tablet 5 mg, 5 mg, Oral, QHS PRN, Lazaro Arms, MD  Diet: routine diet  Activity: Advance as tolerated. Pelvic rest for 6 weeks.   Outpatient follow up:2 weeks for wound check and 6 weeks for routine postpartum care  Postpartum contraception: Nexplanon  Newborn Data: Live born female  Birth Weight: 6 lb 8.6 oz (2965 g) APGAR: ,   Baby Feeding: Bottle and Breast Disposition:home with mother  11/01/2014 Federico Flake, MD

## 2014-11-03 ENCOUNTER — Ambulatory Visit (HOSPITAL_COMMUNITY): Payer: Medicaid Other

## 2014-11-05 ENCOUNTER — Emergency Department (HOSPITAL_COMMUNITY)
Admission: EM | Admit: 2014-11-05 | Discharge: 2014-11-05 | Disposition: A | Discharge status: Home / Self Care | Payer: Medicaid Other | Attending: Emergency Medicine | Admitting: Emergency Medicine

## 2014-11-05 ENCOUNTER — Encounter (HOSPITAL_COMMUNITY): Payer: Self-pay | Admitting: *Deleted

## 2014-11-05 ENCOUNTER — Emergency Department (HOSPITAL_COMMUNITY)
Admission: EM | Admit: 2014-11-05 | Discharge: 2014-11-05 | Disposition: A | Discharge status: Home / Self Care | Payer: Medicaid Other | Source: Home / Self Care | Attending: Emergency Medicine | Admitting: Emergency Medicine

## 2014-11-05 ENCOUNTER — Encounter (HOSPITAL_COMMUNITY): Payer: Self-pay | Admitting: Emergency Medicine

## 2014-11-05 DIAGNOSIS — K920 Hematemesis: Secondary | ICD-10-CM | POA: Insufficient documentation

## 2014-11-05 DIAGNOSIS — R5082 Postprocedural fever: Secondary | ICD-10-CM | POA: Diagnosis present

## 2014-11-05 DIAGNOSIS — R300 Dysuria: Secondary | ICD-10-CM | POA: Diagnosis not present

## 2014-11-05 DIAGNOSIS — R1084 Generalized abdominal pain: Secondary | ICD-10-CM | POA: Diagnosis not present

## 2014-11-05 DIAGNOSIS — Z79899 Other long term (current) drug therapy: Secondary | ICD-10-CM | POA: Insufficient documentation

## 2014-11-05 DIAGNOSIS — R109 Unspecified abdominal pain: Secondary | ICD-10-CM

## 2014-11-05 DIAGNOSIS — Z87448 Personal history of other diseases of urinary system: Secondary | ICD-10-CM | POA: Insufficient documentation

## 2014-11-05 DIAGNOSIS — R112 Nausea with vomiting, unspecified: Secondary | ICD-10-CM

## 2014-11-05 DIAGNOSIS — Z9889 Other specified postprocedural states: Secondary | ICD-10-CM | POA: Insufficient documentation

## 2014-11-05 LAB — CBC WITH DIFFERENTIAL/PLATELET
BASOS ABS: 0 10*3/uL (ref 0.0–0.1)
BASOS PCT: 0 %
BASOS PCT: 0 %
Basophils Absolute: 0 10*3/uL (ref 0.0–0.1)
EOS ABS: 0.1 10*3/uL (ref 0.0–0.7)
EOS PCT: 1 %
Eosinophils Absolute: 0.1 10*3/uL (ref 0.0–0.7)
Eosinophils Relative: 1 %
HEMATOCRIT: 27 % — AB (ref 36.0–46.0)
HEMATOCRIT: 27.1 % — AB (ref 36.0–46.0)
HEMOGLOBIN: 9.3 g/dL — AB (ref 12.0–15.0)
Hemoglobin: 9.2 g/dL — ABNORMAL LOW (ref 12.0–15.0)
LYMPHS ABS: 1 10*3/uL (ref 0.7–4.0)
Lymphocytes Relative: 8 %
Lymphocytes Relative: 12 %
Lymphs Abs: 1.3 10*3/uL (ref 0.7–4.0)
MCH: 29.4 pg (ref 26.0–34.0)
MCH: 28.9 pg (ref 26.0–34.0)
MCHC: 34.4 g/dL (ref 30.0–36.0)
MCHC: 33.9 g/dL (ref 30.0–36.0)
MCV: 85.4 fL (ref 78.0–100.0)
MCV: 85.2 fL (ref 78.0–100.0)
MONO ABS: 0.5 10*3/uL (ref 0.1–1.0)
MONOS PCT: 5 %
MONOS PCT: 5 %
Monocytes Absolute: 0.6 10*3/uL (ref 0.1–1.0)
NEUTROS ABS: 8.6 10*3/uL — AB (ref 1.7–7.7)
NEUTROS PCT: 86 %
Neutro Abs: 10.6 10*3/uL — ABNORMAL HIGH (ref 1.7–7.7)
Neutrophils Relative %: 82 %
Platelets: 237 10*3/uL (ref 150–400)
Platelets: 261 10*3/uL (ref 150–400)
RBC: 3.16 MIL/uL — AB (ref 3.87–5.11)
RBC: 3.18 MIL/uL — ABNORMAL LOW (ref 3.87–5.11)
RDW: 13 % (ref 11.5–15.5)
RDW: 12.8 % (ref 11.5–15.5)
WBC: 12.3 10*3/uL — AB (ref 4.0–10.5)
WBC: 10.5 10*3/uL (ref 4.0–10.5)

## 2014-11-05 LAB — URINALYSIS, ROUTINE W REFLEX MICROSCOPIC
Bilirubin Urine: NEGATIVE
Glucose, UA: NEGATIVE mg/dL
Ketones, ur: NEGATIVE mg/dL
LEUKOCYTES UA: NEGATIVE
Nitrite: NEGATIVE
PROTEIN: 30 mg/dL — AB
SPECIFIC GRAVITY, URINE: 1.01 (ref 1.005–1.030)
UROBILINOGEN UA: 0.2 mg/dL (ref 0.0–1.0)
pH: 7 (ref 5.0–8.0)

## 2014-11-05 LAB — BASIC METABOLIC PANEL
Anion gap: 10 (ref 5–15)
BUN: 10 mg/dL (ref 6–20)
CHLORIDE: 101 mmol/L (ref 101–111)
CO2: 27 mmol/L (ref 22–32)
CREATININE: 0.92 mg/dL (ref 0.44–1.00)
Calcium: 8.8 mg/dL — ABNORMAL LOW (ref 8.9–10.3)
GFR calc non Af Amer: >60 mL/min (ref >60)
Glucose, Bld: 102 mg/dL — ABNORMAL HIGH (ref 65–99)
POTASSIUM: 3.4 mmol/L — AB (ref 3.5–5.1)
SODIUM: 138 mmol/L (ref 135–145)

## 2014-11-05 LAB — COMPREHENSIVE METABOLIC PANEL
ALBUMIN: 2.6 g/dL — AB (ref 3.5–5.0)
ALT: 12 U/L — ABNORMAL LOW (ref 14–54)
AST: 16 U/L (ref 15–41)
Alkaline Phosphatase: 61 U/L (ref 38–126)
Anion gap: 9 (ref 5–15)
BILIRUBIN TOTAL: 0.8 mg/dL (ref 0.3–1.2)
BUN: 11 mg/dL (ref 6–20)
CHLORIDE: 102 mmol/L (ref 101–111)
CO2: 25 mmol/L (ref 22–32)
Calcium: 8.5 mg/dL — ABNORMAL LOW (ref 8.9–10.3)
Creatinine, Ser: 0.81 mg/dL (ref 0.44–1.00)
GFR calc Af Amer: >60 mL/min (ref >60)
GFR calc non Af Amer: >60 mL/min (ref >60)
GLUCOSE: 92 mg/dL (ref 65–99)
POTASSIUM: 3.5 mmol/L (ref 3.5–5.1)
Sodium: 136 mmol/L (ref 135–145)
TOTAL PROTEIN: 6.1 g/dL — AB (ref 6.5–8.1)

## 2014-11-05 LAB — URINE MICROSCOPIC-ADD ON

## 2014-11-05 LAB — POC OCCULT BLOOD, ED: FECAL OCCULT BLD: POSITIVE — AB

## 2014-11-05 MED ORDER — SODIUM CHLORIDE 0.9 % IV BOLUS (SEPSIS)
1000.0000 mL | Freq: Once | INTRAVENOUS | Status: AC
  Administered 2014-11-05: 1000 mL via INTRAVENOUS

## 2014-11-05 MED ORDER — ONDANSETRON HCL 4 MG/2ML IJ SOLN
4.0000 mg | Freq: Once | INTRAMUSCULAR | Status: AC
  Administered 2014-11-05: 4 mg via INTRAVENOUS
  Filled 2014-11-05: qty 2

## 2014-11-05 MED ORDER — PROMETHAZINE HCL 25 MG/ML IJ SOLN: 25.0000 mg | Freq: Once | INTRAMUSCULAR | Status: DC

## 2014-11-05 MED ORDER — ONDANSETRON 4 MG PO TBDP: 4.0000 mg | ORAL_TABLET | Freq: Three times a day (TID) | ORAL | Status: DC | PRN

## 2014-11-05 MED ORDER — SUCRALFATE 1 GM/10ML PO SUSP
1.0000 g | Freq: Three times a day (TID) | ORAL | Status: DC
  Administered 2014-11-05: 1 g via ORAL
  Filled 2014-11-05: qty 10

## 2014-11-05 MED ORDER — MORPHINE SULFATE (PF) 4 MG/ML IV SOLN
4.0000 mg | Freq: Once | INTRAVENOUS | Status: DC
  Filled 2014-11-05: qty 1

## 2014-11-05 MED ORDER — SODIUM CHLORIDE 0.9 % IV BOLUS (SEPSIS): 1000.0000 mL | Freq: Once | INTRAVENOUS | Status: DC

## 2014-11-05 MED ORDER — ONDANSETRON 4 MG PO TBDP
4.0000 mg | ORAL_TABLET | Freq: Once | ORAL | Status: AC
  Administered 2014-11-05: 4 mg via ORAL
  Filled 2014-11-05: qty 1

## 2014-11-05 MED ORDER — OMEPRAZOLE 20 MG PO CPDR: 20.0000 mg | DELAYED_RELEASE_CAPSULE | Freq: Every day | ORAL | Status: DC

## 2014-11-05 NOTE — ED Provider Notes (Signed)
CSN: [id number]     Arrival date & time 11/05/14  0300 History   First MD Initiated Contact with Patient 11/05/14 [phone]     Chief Complaint  Patient presents with  . Fever     Patient is a 19 y.o. female presenting with fever. The history is provided by the patient.  Fever Max temp prior to arrival:  99.5 Severity:  Moderate Onset quality:  Gradual Duration: several hours. Timing:  Constant Progression:  Waxing and waning Chronicity:  New Relieved by:  Ibuprofen Worsened by:  Nothing tried Associated symptoms: chills, dysuria and vomiting   Associated symptoms: no chest pain, no cough, no diarrhea, no headaches and no rash   pt is s/p c-section on 10/30/14 She reports no complications She reports over past several hours she has had chills.  She had tmax at home 99.5.  She has been taking percocet/ibuprofen for pain She vomited once after eating crackers and reported blood in vomitus She has been constipated She reports increased abd pain over past day around her incision No rash noted No drainage from wound reported She is not breast feeding, no breast pain reported  Past Medical History  Diagnosis Date  . Renal disorder     non-functioning left kidney   Past Surgical History  Procedure Laterality Date  . Bladder surgery    . Cesarean section N/A 10/30/2014    Procedure: CESAREAN SECTION;  Surgeon: Lazaro ArmsLuther H Eure, MD;  Location: [facility];  Service: Obstetrics;  Laterality: N/A;   Family History  Problem Relation Age of Onset  . Depression Mother   . Hypertension Father   . Cancer Maternal Grandmother     lung  . Cancer Paternal Grandfather     testicular, lung   Social History  Substance Use Topics  . Smoking status: Never Smoker   . Smokeless tobacco: Never Used  . Alcohol Use: No   OB History    Gravida Para Term Preterm AB TAB SAB Ectopic Multiple Living   1 1 1       0 1     Review of Systems  Constitutional: Positive for fever and chills.  Respiratory:  Negative for cough.   Cardiovascular: Negative for chest pain.  Gastrointestinal: Positive for vomiting. Negative for diarrhea.  Genitourinary: Positive for dysuria. Negative for vaginal discharge.  Skin: Negative for rash.  Neurological: Negative for headaches.  All other systems reviewed and are negative.     Allergies  Review of patient's allergies indicates no known allergies.  Home Medications   Prior to Admission medications   Medication Sig Start Date End Date Taking? Authorizing Provider  cephALEXin (KEFLEX) 500 MG capsule Take 1 capsule (500 mg total) by mouth at bedtime. 09/04/14   Cheral MarkerKimberly R Booker, CNM  ibuprofen (ADVIL,MOTRIN) 600 MG tablet Take 1 tablet (600 mg total) by mouth every 6 (six) hours as needed for mild pain. 11/01/14   Federico FlakeKimberly Niles Newton, MD  oxyCODONE-acetaminophen (PERCOCET/ROXICET) 5-325 MG tablet Take 1 tablet by mouth every 6 (six) hours as needed for severe pain. 11/01/14   Federico FlakeKimberly Niles Newton, MD  Pediatric Multiple Vit-C-FA (FLINSTONES GUMMIES OMEGA-3 DHA PO) Take 2 each by mouth daily.     Historical Provider, MD  ranitidine (ZANTAC) 150 MG tablet Take 1 tablet (150 mg total) by mouth 2 (two) times daily. 08/11/14   Lazaro ArmsLuther H Eure, MD  senna-docusate (SENOKOT-S) 8.6-50 MG tablet Take 2 tablets by mouth daily. 11/01/14   Federico FlakeKimberly Niles Newton, MD   BP  130/95 mmHg  Pulse 106  Temp(Src) 98.8 F (37.1 C) (Oral)  Resp 20  Ht  (1.676 m)  SpO2 100%  LMP 01/29/2014 Physical Exam CONSTITUTIONAL: Well developed/well nourished HEAD: Normocephalic/atraumatic EYES: EOMI/PERRL ENMT: Mucous membranes moist, uvula midline, no exudates NECK: supple no meningeal signs SPINE/BACK:entire spine nontender CV: S1/S2 noted, no murmurs/rubs/gallops noted LUNGS: Lungs are clear to auscultation bilaterally, no apparent distress ABDOMEN: soft, mild diffuse tenderness. No hernia noted. Incision is clean/dry/intact and no erythema noted.   GU:no cva  tenderness NEURO: Pt is awake/alert/appropriate, moves all extremitiesx4.  No facial droop.   EXTREMITIES: pulses normal/equal, full ROM SKIN: warm, color normal PSYCH: no abnormalities of mood noted, alert and oriented to situation  ED Course  Procedures   5:40 AM BP 130/95 mmHg  Pulse 106  Temp(Src) 98.8 F (37.1 C) (Oral)  Resp 20  Ht  (1.676 m)  SpO2 100%  LMP 01/29/2014 Pt is s/p c-section.  She is well appearing.  Abd soft with only mild tenderness.  Wound is well healing She reported tmax at home less than 100 No fever here No vomiting here Suspect blood in vomitus likely due to ibuprofen or possibly irritation from vomiting No signs of active GI bleed (hemoccult positive but stool light in color, no blood/melena per nursing) I spoke to on call OBGYN Dr Debroah Loop about this case. We discussed history/physical/labs Given that pt is stable, no fever here, will d/c home She has OB f/u tomorrow Advised pt to stop IBUPROFEN for now We discussed strict ER return precautions   Medications  morphine 4 MG/ML injection 4 mg (4 mg Intravenous Not Given 11/05/14 0356)  sodium chloride 0.9 % bolus 1,000 mL (0 mLs Intravenous Stopped 11/05/14 0510)  ondansetron (ZOFRAN) injection 4 mg (4 mg Intravenous Given 11/05/14 0402)    Labs Review Labs Reviewed  CBC WITH DIFFERENTIAL/PLATELET - Abnormal; Notable for the following:    WBC 12.3 (*)    RBC 3.16 (*)    Hemoglobin 9.3 (*)    HCT 27.0 (*)    Neutro Abs 10.6 (*)    All other components within normal limits  URINALYSIS, ROUTINE W REFLEX MICROSCOPIC (NOT AT [facility]) - Abnormal; Notable for the following:    Hgb urine dipstick LARGE (*)    Protein, ur 30 (*)    All other components within normal limits  BASIC METABOLIC PANEL - Abnormal; Notable for the following:    Potassium 3.4 (*)    Glucose, Bld 102 (*)    Calcium 8.8 (*)    All other components within normal limits  URINE MICROSCOPIC-ADD ON - Abnormal; Notable for  the following:    Squamous Epithelial / LPF FEW (*)    Bacteria, UA FEW (*)    All other components within normal limits  POC OCCULT BLOOD, ED - Abnormal; Notable for the following:    Fecal Occult Bld POSITIVE (*)    All other components within normal limits    I have personally reviewed and evaluated these  lab results as part of my medical decision-making.    MDM   Final diagnoses:  Abdominal pain, unspecified abdominal location    Nursing notes including past medical history and social history reviewed and considered in documentation Labs/vital reviewed myself and considered during evaluation Previous records reviewed and considered     Zadie Rhine, MD 11/05/14 [phone]

## 2014-11-05 NOTE — Discharge Instructions (Signed)
As discussed, it is very important that you keep your scheduled follow up visit tomorrow.  And be sure to call our gastroenterology colleagues.  Please do not take any additional anti-inflammatory medication, such as ibuprofen, nor any narcotic medication. Tylenol as appropriate pain relief.  Please use Maalox, up to 3 times daily for additional relief.   Return here for concerning changes in your condition.

## 2014-11-05 NOTE — ED Notes (Signed)
Patient c/o severe abd pain since having c-section on Monday. Patient seen here in ER last night for pain but has since started vomiting blood clots. Per patient "at least a cup full of bloody emesis."

## 2014-11-05 NOTE — ED Provider Notes (Addendum)
CSN: [id number]     Arrival date & time 11/05/14  1819 History   First MD Initiated Contact with Patient 11/05/14 1828     Chief Complaint  Patient presents with  . Post-op Problem     (Consider location/radiation/quality/duration/timing/severity/associated sxs/prior Treatment) HPI Patient presents for second time in 16 hours with concern of ongoing diffuse abdominal discomfort, nausea, and one episode of vomiting. Patient notes that since discharge earlier today, she has been generally in similar condition, with mild diffuse abdominal discomfort, persistent nausea, anorexia, and about one hour prior to my evaluation and one additional episode of vomiting, with blood. No medication taken thus far. No ongoing fever, chills, confusion, disorientation, lightheadedness. No chest pain. Patient has not had syncytial bowel movement since recent cesarean section No urinary complaints.   Past Medical History  Diagnosis Date  . Renal disorder     non-functioning left kidney   Past Surgical History  Procedure Laterality Date  . Bladder surgery    . Cesarean section N/A 10/30/2014    Procedure: CESAREAN SECTION;  Surgeon: Lazaro ArmsLuther H Eure, MD;  Location: [facility];  Service: Obstetrics;  Laterality: N/A;   Family History  Problem Relation Age of Onset  . Depression Mother   . Hypertension Father   . Cancer Maternal Grandmother     lung  . Cancer Paternal Grandfather     testicular, lung   Social History  Substance Use Topics  . Smoking status: Never Smoker   . Smokeless tobacco: Never Used  . Alcohol Use: No   OB History    Gravida Para Term Preterm AB TAB SAB Ectopic Multiple Living   1 1 1       0 1     Review of Systems  Constitutional:       Per HPI, otherwise negative  HENT:       Per HPI, otherwise negative  Respiratory:       Per HPI, otherwise negative  Cardiovascular:       Per HPI, otherwise negative  Gastrointestinal: Positive for vomiting.  Endocrine:   Negative aside from HPI  Genitourinary:       Neg aside from HPI   Musculoskeletal:       Per HPI, otherwise negative  Skin: Negative.   Neurological: Negative for syncope.      Allergies  Review of patient's allergies indicates no known allergies.  Home Medications   Prior to Admission medications   Medication Sig Start Date End Date Taking? Authorizing Provider  cephALEXin (KEFLEX) 500 MG capsule Take 1 capsule (500 mg total) by mouth at bedtime. 09/04/14  Yes Cheral MarkerKimberly R Booker, CNM  oxyCODONE-acetaminophen (PERCOCET/ROXICET) 5-325 MG tablet Take 1 tablet by mouth every 6 (six) hours as needed for severe pain. 11/01/14  Yes Federico FlakeKimberly Niles Newton, MD  Pediatric Multiple Vit-C-FA (FLINSTONES GUMMIES OMEGA-3 DHA PO) Take 2 each by mouth daily.    Yes Historical Provider, MD  senna-docusate (SENOKOT-S) 8.6-50 MG tablet Take 2 tablets by mouth daily. 11/01/14  Yes Federico FlakeKimberly Niles Newton, MD  ranitidine (ZANTAC) 150 MG tablet Take 1 tablet (150 mg total) by mouth 2 (two) times daily. Patient not taking: Reported on 11/05/2014 08/11/14   Lazaro ArmsLuther H Eure, MD   BP 144/93 mmHg  Pulse 102  Temp(Src) 99.7 F (37.6 C) (Oral)  Resp 18  Ht 5\' 6"  (1.676 m)  Wt 154 lb (69.854 kg)  BMI 24.87 kg/m2  SpO2 100%  LMP 01/29/2014 Physical Exam  Constitutional: She is oriented to  person, place, and time. She appears well-developed and well-nourished. No distress.  HENT:  Head: Normocephalic and atraumatic.  Eyes: Conjunctivae and EOM are normal.  Cardiovascular: Normal rate and regular rhythm.   Pulmonary/Chest: Effort normal and breath sounds normal. No stridor. No respiratory distress.  Abdominal: She exhibits no distension.    Musculoskeletal: She exhibits no edema.  Neurological: She is alert and oriented to person, place, and time. No cranial nerve deficit.  Skin: Skin is warm and dry.  Psychiatric: She has a normal mood and affect.  Nursing note and vitals reviewed.   ED Course    Procedures (including critical care time) Labs Review Labs Reviewed  CBC WITH DIFFERENTIAL/PLATELET - Abnormal; Notable for the following:    RBC 3.18 (*)    Hemoglobin 9.2 (*)    HCT 27.1 (*)    Neutro Abs 8.6 (*)    All other components within normal limits  COMPREHENSIVE METABOLIC PANEL - Abnormal; Notable for the following:    Calcium 8.5 (*)    Total Protein 6.1 (*)    Albumin 2.6 (*)    ALT 12 (*)    All other components within normal limits    Chart review is notable for today's evaluation, with reassuring labs, and with establishment of follow-up visit Tomorrow.  On repeat exam the patient is in no distress. She remains hemodynamically stable. No additional vomiting since arrival. I had a conversation with the patient and her parents about all findings, ongoing constipation, need to keep tomorrow's planned with her obstetrics team.   9:55 PM Patient one additional episode of vomiting remains hemodynamically stable, nothing sustained. She received additional Zofran. I again discussed her case with her obstetrics team, and she'll follow-up tomorrow there. Condition, the patient will follow-up with gastroenterology. MDM  Young female now less than one week out from cesarean section presents with ongoing discomfort, as well as 2 episodes of hematemesis. Here, the patient has been afebrile, with no evidence for hemodynamic instability, no physical exam evidence concerning for endometritis, or other acute new abdominal pathology.  Symptoms likely due to gastritis either stress-induced from cesarean section versus from her analgesia  / NSAID use Patient has scheduled obstetrics follow-up tomorrow morning, was discharged with her family members.  Gerhard Munch, MD 11/05/14 1610  Gerhard Munch, MD 11/05/14 2156

## 2014-11-05 NOTE — ED Notes (Addendum)
Patient vomited x 1. Dr. Jeraldine LootsLockwood notified. [facility] and she can be seen tomorrow. States that he will go speak with her and her family.

## 2014-11-05 NOTE — Discharge Instructions (Signed)
°  SEEK IMMEDIATE MEDICAL ATTENTION IF: The pain does not go away or becomes severe, particularly over the next 8-12 hours.  A temperature above 100.55F develops.  Repeated vomiting occurs (multiple episodes).  The pain becomes localized to portions of the abdomen. The right side could possibly be appendicitis. In an adult, the left lower portion of the abdomen could be colitis or diverticulitis.  Blood is being passed in stools (bright red or black tarry stools).  Return also if you develop chest pain, difficulty breathing, dizziness or fainting, or become confused, poorly responsive, or inconsolable.

## 2014-11-05 NOTE — ED Notes (Addendum)
Pt states she has ran a low grade temp of 99.5 since 11am yesterday. Pt also states she vomited a blood clot around 2am this morning. Pt c/o nausea. Pt gave birth 6 days ago.

## 2014-11-06 ENCOUNTER — Encounter: Payer: Self-pay | Admitting: Obstetrics & Gynecology

## 2014-11-06 ENCOUNTER — Ambulatory Visit (INDEPENDENT_AMBULATORY_CARE_PROVIDER_SITE_OTHER): Payer: Medicaid Other | Admitting: Obstetrics & Gynecology

## 2014-11-06 VITALS — BP 120/80 | HR 84 | Wt 154.0 lb

## 2014-11-06 DIAGNOSIS — Z98891 History of uterine scar from previous surgery: Secondary | ICD-10-CM

## 2014-11-06 DIAGNOSIS — Z9889 Other specified postprocedural states: Secondary | ICD-10-CM

## 2014-11-06 NOTE — Progress Notes (Signed)
Patient ID: Charlotte Smith, female   DOB: 1995/12/15, 19 y.o.   MRN: [id number]  HPI: Patient returns for routine postoperative follow-up having undergone primary Caesarean section10/10/2014 The patient's immediate postoperative recovery has been unremarkable except for some nausaea and vomiting yesterday wet to Charlotte HawkingAnnie Smith probably had a Mallory Weiss tear, on prilosec and zofran Since hospital discharge the patient reports no other issues.   Current Outpatient Prescriptions: cephALEXin (KEFLEX) 500 MG capsule, Take 1 capsule (500 mg total) by mouth at bedtime. (Patient not taking: Reported on 11/06/2014), Disp: 30 capsule, Rfl: 2 omeprazole (PRILOSEC) 20 MG capsule, Take 1 capsule (20 mg total) by mouth daily. (Patient not taking: Reported on 11/06/2014), Disp: 14 capsule, Rfl: 0 ondansetron (ZOFRAN ODT) 4 MG disintegrating tablet, Take 1 tablet (4 mg total) by mouth every 8 (eight) hours as needed for nausea or vomiting. (Patient not taking: Reported on 11/06/2014), Disp: 20 tablet, Rfl: 0 Pediatric Multiple Vit-C-FA (FLINSTONES GUMMIES OMEGA-3 DHA PO), Take 2 each by mouth daily. , Disp: , Rfl:  ranitidine (ZANTAC) 150 MG tablet, Take 1 tablet (150 mg total) by mouth 2 (two) times daily. (Patient not taking: Reported on 11/05/2014), Disp: 60 tablet, Rfl: 3 senna-docusate (SENOKOT-S) 8.6-50 MG tablet, Take 2 tablets by mouth daily. (Patient not taking: Reported on 11/06/2014), Disp: 30 tablet, Rfl: 1  No current facility-administered medications for this visit.    Blood pressure 120/80, pulse 84, weight 154 lb (69.854 kg), last menstrual period 01/29/2014, unknown if currently breastfeeding.  Physical Exam: Incision clean dry intact  Diagnostic Tests: none  Pathology: na  Impression: Normal post op state  Plan: Recommend dulcolax suppository  Follow up: 5  weeks  Charlotte ArmsEURE,Charlotte Smith H, MD

## 2014-11-20 ENCOUNTER — Encounter (HOSPITAL_COMMUNITY): Payer: Self-pay | Admitting: *Deleted

## 2014-12-11 ENCOUNTER — Ambulatory Visit: Payer: Medicaid Other | Admitting: Women's Health

## 2014-12-12 ENCOUNTER — Encounter: Payer: Self-pay | Admitting: Women's Health

## 2014-12-12 ENCOUNTER — Ambulatory Visit (INDEPENDENT_AMBULATORY_CARE_PROVIDER_SITE_OTHER): Payer: Medicaid Other | Admitting: Women's Health

## 2014-12-12 DIAGNOSIS — Z98891 History of uterine scar from previous surgery: Secondary | ICD-10-CM

## 2014-12-12 NOTE — Patient Instructions (Signed)
Etonogestrel implant What is this medicine? ETONOGESTREL (et oh noe JES trel) is a contraceptive (birth control) device. It is used to prevent pregnancy. It can be used for up to 3 years. This medicine may be used for other purposes; ask your health care provider or pharmacist if you have questions. What should I tell my health care provider before I take this medicine? They need to know if you have any of these conditions: -abnormal vaginal bleeding -blood vessel disease or blood clots -cancer of the breast, cervix, or liver -depression -diabetes -gallbladder disease -headaches -heart disease or recent heart attack -high blood pressure -high cholesterol -kidney disease -liver disease -renal disease -seizures -tobacco smoker -an unusual or allergic reaction to etonogestrel, other hormones, anesthetics or antiseptics, medicines, foods, dyes, or preservatives -pregnant or trying to get pregnant -breast-feeding How should I use this medicine? This device is inserted just under the skin on the inner side of your upper arm by a health care professional. Talk to your pediatrician regarding the use of this medicine in children. Special care may be needed. Overdosage: If you think you have taken too much of this medicine contact a poison control center or emergency room at once. NOTE: This medicine is only for you. Do not share this medicine with others. What if I miss a dose? This does not apply. What may interact with this medicine? Do not take this medicine with any of the following medications: -amprenavir -bosentan -fosamprenavir This medicine may also interact with the following medications: -barbiturate medicines for inducing sleep or treating seizures -certain medicines for fungal infections like ketoconazole and itraconazole -griseofulvin -medicines to treat seizures like carbamazepine, felbamate, oxcarbazepine, phenytoin,  topiramate -modafinil -phenylbutazone -rifampin -some medicines to treat HIV infection like atazanavir, indinavir, lopinavir, nelfinavir, tipranavir, ritonavir -St. John's wort This list may not describe all possible interactions. Give your health care provider a list of all the medicines, herbs, non-prescription drugs, or dietary supplements you use. Also tell them if you smoke, drink alcohol, or use illegal drugs. Some items may interact with your medicine. What should I watch for while using this medicine? This product does not protect you against HIV infection (AIDS) or other sexually transmitted diseases. You should be able to feel the implant by pressing your fingertips over the skin where it was inserted. Contact your doctor if you cannot feel the implant, and use a non-hormonal birth control method (such as condoms) until your doctor confirms that the implant is in place. If you feel that the implant may have broken or become bent while in your arm, contact your healthcare provider. What side effects may I notice from receiving this medicine? Side effects that you should report to your doctor or health care professional as soon as possible: -allergic reactions like skin rash, itching or hives, swelling of the face, lips, or tongue -breast lumps -changes in emotions or moods -depressed mood -heavy or prolonged menstrual bleeding -pain, irritation, swelling, or bruising at the insertion site -scar at site of insertion -signs of infection at the insertion site such as fever, and skin redness, pain or discharge -signs of pregnancy -signs and symptoms of a blood clot such as breathing problems; changes in vision; chest pain; severe, sudden headache; pain, swelling, warmth in the leg; trouble speaking; sudden numbness or weakness of the face, arm or leg -signs and symptoms of liver injury like dark yellow or brown urine; general ill feeling or flu-like symptoms; light-colored stools; loss of  appetite; nausea; right upper belly   pain; unusually weak or tired; yellowing of the eyes or skin -unusual vaginal bleeding, discharge -signs and symptoms of a stroke like changes in vision; confusion; trouble speaking or understanding; severe headaches; sudden numbness or weakness of the face, arm or leg; trouble walking; dizziness; loss of balance or coordination Side effects that usually do not require medical attention (Report these to your doctor or health care professional if they continue or are bothersome.): -acne -back pain -breast pain -changes in weight -dizziness -general ill feeling or flu-like symptoms -headache -irregular menstrual bleeding -nausea -sore throat -vaginal irritation or inflammation This list may not describe all possible side effects. Call your doctor for medical advice about side effects. You may report side effects to FDA at 1-800-FDA-1088. Where should I keep my medicine? This drug is given in a hospital or clinic and will not be stored at home. NOTE: This sheet is a summary. It may not cover all possible information. If you have questions about this medicine, talk to your doctor, pharmacist, or health care provider.    2016, Elsevier/Gold Standard. (2013-10-21 14:07:06)  

## 2014-12-12 NOTE — Progress Notes (Signed)
Patient ID: Charlotte GirtVictoria Smith, female   DOB: 06-12-1995, 19 y.o.   MRN: [id number] Subjective:    Charlotte GirtVictoria Smith is a 19 y.o. [id number] Caucasian female who presents for a postpartum visit. She is 6 weeks postpartum following a primary cesarean section, low transverse incision at 39.1 gestational weeks d/t breech, declined ECV. Anesthesia: spinal. I have fully reviewed the prenatal and intrapartum course. Postpartum course has been uncomplicated. Baby's course has been uncomplicated. Baby is feeding by bottle. Bleeding no bleeding. Bowel function is normal. Bladder function is normal. Patient is sexually active. Last sexual activity: last night, used condom. Contraception method is condoms and wants nexplanon. Postpartum depression screening: negative. Score 0.  Last pap 03/27/14 and was neg.  The following portions of the patient's history were reviewed and updated as appropriate: allergies, current medications, past medical history, past surgical history and problem list.  Review of Systems Pertinent items are noted in HPI.   Filed Vitals:   12/12/14 1411  BP: 120/80  Pulse: 72  Weight: 144 lb (65.318 kg)   No LMP recorded.  Objective:   General:  alert, cooperative and no distress   Breasts:  deferred, no complaints  Lungs: clear to auscultation bilaterally  Heart:  regular rate and rhythm  Abdomen: soft, nontender, incision well healed   Vulva: normal  Vagina: normal vagina  Cervix:  closed  Corpus: Well-involuted  Adnexa:  Non-palpable  Rectal Exam: No hemorrhoids        Assessment:   Postpartum exam 6 wks s/p PLTCS for breech, declined ECV Bottlefeeding Depression screening Contraception counseling   Plan:   Contraception: abstinence until Nexplanon placement Follow up in: 12/1 for bhcg am and nexplanon insertion pm, or earlier if needed  Marge DuncansBooker, Yomira Flitton Randall CNM, [facility] 12/12/2014 2:23 PM

## 2014-12-21 ENCOUNTER — Encounter: Payer: Self-pay | Admitting: Advanced Practice Midwife

## 2014-12-21 ENCOUNTER — Ambulatory Visit (INDEPENDENT_AMBULATORY_CARE_PROVIDER_SITE_OTHER): Payer: Medicaid Other | Admitting: Advanced Practice Midwife

## 2014-12-21 ENCOUNTER — Other Ambulatory Visit: Payer: Medicaid Other

## 2014-12-21 VITALS — BP 120/80 | HR 70 | Ht 66.0 in | Wt 144.0 lb

## 2014-12-21 DIAGNOSIS — Z30017 Encounter for initial prescription of implantable subdermal contraceptive: Secondary | ICD-10-CM | POA: Insufficient documentation

## 2014-12-21 DIAGNOSIS — Z3040 Encounter for surveillance of contraceptives, unspecified: Secondary | ICD-10-CM

## 2014-12-21 LAB — BETA HCG QUANT (REF LAB): HCG QUANT: 2 m[IU]/mL

## 2014-12-21 NOTE — Progress Notes (Signed)
  HPI:  Charlotte Smith is a 19 y.o. year old Caucasian female here for Nexplanon insertion.  She is postpartum, last (protectd) intercourse 11/21, and her serum pregnancy test today was negative.  Risks/benefits/side effects of Nexplanon have been discussed and her questions have been answered.  Specifically, a failure rate of 01/998 has been reported, with an increased failure rate if pt takes St. John's Wort and/or antiseizure medicaitons.  Charlotte Smith is aware of the common side effect of irregular bleeding, which the incidence of decreases over time.   Past Medical History: Past Medical History  Diagnosis Date  . Renal disorder     non-functioning left kidney    Past Surgical History: Past Surgical History  Procedure Laterality Date  . Bladder surgery    . Cesarean section N/A 10/30/2014    Procedure: CESAREAN SECTION;  Surgeon: Lazaro ArmsLuther H Eure, MD;  Location: [facility];  Service: Obstetrics;  Laterality: N/A;    Family History: Family History  Problem Relation Age of Onset  . Depression Mother   . Hypertension Father   . Cancer Maternal Grandmother     lung  . Cancer Paternal Grandfather     testicular, lung    Social History: Social History  Substance Use Topics  . Smoking status: Never Smoker   . Smokeless tobacco: Never Used  . Alcohol Use: No    Allergies: No Known Allergies    Her left arm, approximatly 4 inches proximal from the elbow, was cleansed with alcohol and anesthetized with 2cc of 2% Lidocaine.  The area was cleansed again and the Nexplanon was inserted without difficulty.  A pressure bandage was applied.  Pt was instructed to remove pressure bandage in a few hours, and keep insertion site covered with a bandaid for 3 days.  Back up contraception was recommended for 2 weeks.  Follow-up scheduled PRN problems  CRESENZO-DISHMAN,Jasir Rother 12/21/2014 2:46 PM

## 2016-04-17 ENCOUNTER — Emergency Department (HOSPITAL_COMMUNITY)
Admission: EM | Admit: 2016-04-17 | Discharge: 2016-04-17 | Disposition: A | Discharge status: Home / Self Care | Payer: Medicaid Other | Attending: Dermatology | Admitting: Dermatology

## 2016-04-17 ENCOUNTER — Encounter (HOSPITAL_COMMUNITY): Payer: Self-pay

## 2016-04-17 DIAGNOSIS — R1110 Vomiting, unspecified: Secondary | ICD-10-CM | POA: Insufficient documentation

## 2016-04-17 DIAGNOSIS — Z5321 Procedure and treatment not carried out due to patient leaving prior to being seen by health care provider: Secondary | ICD-10-CM | POA: Insufficient documentation

## 2016-04-17 MED ORDER — ONDANSETRON 4 MG PO TBDP
4.0000 mg | ORAL_TABLET | Freq: Once | ORAL | Status: AC | PRN
  Administered 2016-04-17: 4 mg via ORAL

## 2016-04-17 MED ORDER — ONDANSETRON 4 MG PO TBDP
ORAL_TABLET | ORAL | Status: DC
Start: 2016-04-17 — End: 2016-04-18
  Filled 2016-04-17: qty 1

## 2016-04-17 NOTE — ED Notes (Signed)
No answer when pt called to treatment room. 

## 2016-04-17 NOTE — ED Notes (Signed)
Patient noted not to be in waiting room. Wheelchair with blanket still remains in waiting area. Patient and significant other not in waiting room at this time.

## 2016-04-17 NOTE — ED Triage Notes (Signed)
Patient states that she started vomiting and having diarrhea today.  Complaining of epigastric and back pain.

## 2016-04-17 NOTE — ED Notes (Signed)
Gave patient warm blanket as requested.  

## 2016-04-17 NOTE — ED Notes (Signed)
Called to place patient in room, no answer. 

## 2017-10-15 MED FILL — SULFAMETHOXAZOLE-TMP DS TAB: 800-160 | 3 days supply | Qty: 6 | Fill #0

## 2018-01-19 ENCOUNTER — Other Ambulatory Visit (HOSPITAL_COMMUNITY): Payer: Self-pay | Admitting: Nephrology

## 2018-01-19 ENCOUNTER — Other Ambulatory Visit (HOSPITAL_COMMUNITY)
Admission: RE | Admit: 2018-01-19 | Discharge: 2018-01-19 | Disposition: A | Discharge status: Home / Self Care | Payer: Self-pay | Source: Ambulatory Visit | Attending: Nephrology | Admitting: Nephrology

## 2018-01-19 DIAGNOSIS — Z1159 Encounter for screening for other viral diseases: Secondary | ICD-10-CM | POA: Insufficient documentation

## 2018-01-19 DIAGNOSIS — R300 Dysuria: Secondary | ICD-10-CM | POA: Insufficient documentation

## 2018-01-19 DIAGNOSIS — N289 Disorder of kidney and ureter, unspecified: Secondary | ICD-10-CM

## 2018-01-19 DIAGNOSIS — Z79899 Other long term (current) drug therapy: Secondary | ICD-10-CM | POA: Insufficient documentation

## 2018-01-19 DIAGNOSIS — R319 Hematuria, unspecified: Secondary | ICD-10-CM | POA: Insufficient documentation

## 2018-01-19 DIAGNOSIS — D509 Iron deficiency anemia, unspecified: Secondary | ICD-10-CM | POA: Insufficient documentation

## 2018-01-19 LAB — URINALYSIS, ROUTINE W REFLEX MICROSCOPIC
Bilirubin Urine: NEGATIVE
Glucose, UA: NEGATIVE mg/dL
Ketones, ur: NEGATIVE mg/dL
Nitrite: NEGATIVE
PROTEIN: NEGATIVE mg/dL
Specific Gravity, Urine: 1.008 (ref 1.005–1.030)
pH: 7 (ref 5.0–8.0)

## 2018-01-19 LAB — HEMOGLOBIN AND HEMATOCRIT, BLOOD
HEMATOCRIT: 39.9 % (ref 36.0–46.0)
Hemoglobin: 13.3 g/dL (ref 12.0–15.0)

## 2018-01-19 LAB — RENAL FUNCTION PANEL
ALBUMIN: 4.3 g/dL (ref 3.5–5.0)
Anion gap: 8 (ref 5–15)
BUN: 14 mg/dL (ref 6–20)
CO2: 25 mmol/L (ref 22–32)
Calcium: 9.5 mg/dL (ref 8.9–10.3)
Chloride: 105 mmol/L (ref 98–111)
Creatinine, Ser: 0.82 mg/dL (ref 0.44–1.00)
Glucose, Bld: 90 mg/dL (ref 70–99)
PHOSPHORUS: 3.3 mg/dL (ref 2.5–4.6)
POTASSIUM: 3.9 mmol/L (ref 3.5–5.1)
Sodium: 138 mmol/L (ref 135–145)

## 2018-01-19 LAB — VITAMIN B12: Vitamin B-12: 401 pg/mL (ref 180–914)

## 2018-01-19 LAB — FERRITIN: Ferritin: 40 ng/mL (ref 11–307)

## 2018-01-19 LAB — FOLATE: Folate: 11.7 ng/mL (ref >5.9)

## 2018-01-19 LAB — IRON AND TIBC
Iron: 71 ug/dL (ref 28–170)
Saturation Ratios: 23 % (ref 10.4–31.8)
TIBC: 308 ug/dL (ref 250–450)
UIBC: 237 ug/dL

## 2018-01-20 ENCOUNTER — Other Ambulatory Visit (HOSPITAL_COMMUNITY)
Admission: RE | Admit: 2018-01-20 | Discharge: 2018-01-20 | Disposition: A | Discharge status: Home / Self Care | Payer: Self-pay | Source: Ambulatory Visit | Attending: Nephrology | Admitting: Nephrology

## 2018-01-20 DIAGNOSIS — Z1159 Encounter for screening for other viral diseases: Secondary | ICD-10-CM | POA: Insufficient documentation

## 2018-01-20 DIAGNOSIS — R809 Proteinuria, unspecified: Secondary | ICD-10-CM | POA: Insufficient documentation

## 2018-01-20 DIAGNOSIS — Z79899 Other long term (current) drug therapy: Secondary | ICD-10-CM | POA: Insufficient documentation

## 2018-01-20 LAB — CREATININE CLEARANCE, URINE, 24 HOUR
Collection Interval-CRCL: 24 hours
Creatinine Clearance: 89 mL/min (ref 75–115)
Creatinine, 24H Ur: 1158 mg/d (ref 600–1800)
Creatinine, Urine: 121.88 mg/dL
Urine Total Volume-CRCL: 950 mL

## 2018-01-20 LAB — HCV AB W REFLEX TO QUANT PCR: HCV Ab: <0.1 s/co ratio (ref 0.0–0.9)

## 2018-01-20 LAB — C3 COMPLEMENT: C3 Complement: 120 mg/dL (ref 82–167)

## 2018-01-20 LAB — HCV INTERPRETATION

## 2018-01-20 LAB — HEPATITIS B SURFACE ANTIBODY, QUANTITATIVE: Hep B S AB Quant (Post): 34.3 m[IU]/mL (ref >9.9)

## 2018-01-20 LAB — C4 COMPLEMENT: Complement C4, Body Fluid: 25 mg/dL (ref 14–44)

## 2018-01-21 ENCOUNTER — Encounter: Payer: Self-pay | Admitting: Women's Health

## 2018-01-21 ENCOUNTER — Ambulatory Visit: Payer: No Typology Code available for payment source | Admitting: Women's Health

## 2018-01-21 VITALS — BP 104/71 | HR 108 | Ht 66.0 in | Wt 152.0 lb

## 2018-01-21 DIAGNOSIS — Z3202 Encounter for pregnancy test, result negative: Secondary | ICD-10-CM | POA: Diagnosis not present

## 2018-01-21 DIAGNOSIS — Z3046 Encounter for surveillance of implantable subdermal contraceptive: Secondary | ICD-10-CM

## 2018-01-21 DIAGNOSIS — Z3049 Encounter for surveillance of other contraceptives: Secondary | ICD-10-CM | POA: Diagnosis not present

## 2018-01-21 DIAGNOSIS — Z30017 Encounter for initial prescription of implantable subdermal contraceptive: Secondary | ICD-10-CM

## 2018-01-21 LAB — PROTEIN ELECTROPHORESIS, SERUM
A/G RATIO SPE: 1.5 (ref 0.7–1.7)
Albumin ELP: 4.3 g/dL (ref 2.9–4.4)
Alpha-1-Globulin: 0.2 g/dL (ref 0.0–0.4)
Alpha-2-Globulin: 0.7 g/dL (ref 0.4–1.0)
Beta Globulin: 0.9 g/dL (ref 0.7–1.3)
GLOBULIN, TOTAL: 2.8 g/dL (ref 2.2–3.9)
Gamma Globulin: 1 g/dL (ref 0.4–1.8)
Total Protein ELP: 7.1 g/dL (ref 6.0–8.5)

## 2018-01-21 LAB — KAPPA/LAMBDA LIGHT CHAINS
Kappa free light chain: 17.8 mg/L (ref 3.3–19.4)
Kappa, lambda light chain ratio: 1.14 (ref 0.26–1.65)
Lambda free light chains: 15.6 mg/L (ref 5.7–26.3)

## 2018-01-21 LAB — IMMUNOFIXATION ELECTROPHORESIS
IGG (IMMUNOGLOBIN G), SERUM: 1013 mg/dL (ref 700–1600)
IgA: 80 mg/dL — ABNORMAL LOW (ref 87–352)
IgM (Immunoglobulin M), Srm: 196 mg/dL (ref 26–217)
Total Protein ELP: 7 g/dL (ref 6.0–8.5)

## 2018-01-21 LAB — ANTI-DNA ANTIBODY, DOUBLE-STRANDED: ds DNA Ab: 2 IU/mL (ref 0–9)

## 2018-01-21 LAB — ANA: Anti Nuclear Antibody(ANA): NEGATIVE

## 2018-01-21 LAB — POCT URINE PREGNANCY: Preg Test, Ur: NEGATIVE

## 2018-01-21 MED ORDER — ETONOGESTREL 68 MG ~~LOC~~ IMPL
68.0000 mg | DRUG_IMPLANT | Freq: Once | SUBCUTANEOUS | Status: AC
  Administered 2018-01-21: 68 mg via SUBCUTANEOUS

## 2018-01-21 NOTE — Patient Instructions (Signed)
Keep the area clean and dry.  You can remove the big bandage in 24 hours, and the small steri-strip bandage in 3-5 days.  A back up method, such as condoms, should be used for two weeks. You may have irregular vaginal bleeding for the first 6 months after the Nexplanon is placed, then the bleeding usually lightens and it is possible that you may not have any periods.  If you have any concerns, please give us a call.    Etonogestrel implant What is this medicine? ETONOGESTREL (et oh noe JES trel) is a contraceptive (birth control) device. It is used to prevent pregnancy. It can be used for up to 3 years. This medicine may be used for other purposes; ask your health care provider or pharmacist if you have questions. COMMON BRAND NAME(S): Implanon, Nexplanon What should I tell my health care provider before I take this medicine? They need to know if you have any of these conditions: -abnormal vaginal bleeding -blood vessel disease or blood clots -breast, cervical, endometrial, ovarian, liver, or uterine cancer -diabetes -gallbladder disease -heart disease or recent heart attack -high blood pressure -high cholesterol or triglycerides -kidney disease -liver disease -migraine headaches -seizures -stroke -tobacco smoker -an unusual or allergic reaction to etonogestrel, anesthetics or antiseptics, other medicines, foods, dyes, or preservatives -pregnant or trying to get pregnant -breast-feeding How should I use this medicine? This device is inserted just under the skin on the inner side of your upper arm by a health care professional. Talk to your pediatrician regarding the use of this medicine in children. Special care may be needed. Overdosage: If you think you have taken too much of this medicine contact a poison control center or emergency room at once. NOTE: This medicine is only for you. Do not share this medicine with others. What if I miss a dose? This does not apply. What may  interact with this medicine? Do not take this medicine with any of the following medications: -amprenavir -fosamprenavir This medicine may also interact with the following medications: -acitretin -aprepitant -armodafinil -bexarotene -bosentan -carbamazepine -certain medicines for fungal infections like fluconazole, ketoconazole, itraconazole and voriconazole -certain medicines to treat hepatitis, HIV or AIDS -cyclosporine -felbamate -griseofulvin -lamotrigine -modafinil -oxcarbazepine -phenobarbital -phenytoin -primidone -rifabutin -rifampin -rifapentine -St. John's wort -topiramate This list may not describe all possible interactions. Give your health care provider a list of all the medicines, herbs, non-prescription drugs, or dietary supplements you use. Also tell them if you smoke, drink alcohol, or use illegal drugs. Some items may interact with your medicine. What should I watch for while using this medicine? This product does not protect you against HIV infection (AIDS) or other sexually transmitted diseases. You should be able to feel the implant by pressing your fingertips over the skin where it was inserted. Contact your doctor if you cannot feel the implant, and use a non-hormonal birth control method (such as condoms) until your doctor confirms that the implant is in place. Contact your doctor if you think that the implant may have broken or become bent while in your arm. You will receive a user card from your health care provider after the implant is inserted. The card is a record of the location of the implant in your upper arm and when it should be removed. Keep this card with your health records. What side effects may I notice from receiving this medicine? Side effects that you should report to your doctor or health care professional as soon as possible: -allergic   reactions like skin rash, itching or hives, swelling of the face, lips, or tongue -breast lumps, breast  tissue changes, or discharge -breathing problems -changes in emotions or moods -if you feel that the implant may have broken or bent while in your arm -high blood pressure -pain, irritation, swelling, or bruising at the insertion site -scar at site of insertion -signs of infection at the insertion site such as fever, and skin redness, pain or discharge -signs and symptoms of a blood clot such as breathing problems; changes in vision; chest pain; severe, sudden headache; pain, swelling, warmth in the leg; trouble speaking; sudden numbness or weakness of the face, arm or leg -signs and symptoms of liver injury like dark yellow or brown urine; general ill feeling or flu-like symptoms; light-colored stools; loss of appetite; nausea; right upper belly pain; unusually weak or tired; yellowing of the eyes or skin -unusual vaginal bleeding, discharge Side effects that usually do not require medical attention (report to your doctor or health care professional if they continue or are bothersome): -acne -breast pain or tenderness -headache -irregular menstrual bleeding -nausea This list may not describe all possible side effects. Call your doctor for medical advice about side effects. You may report side effects to FDA at 1-800-FDA-1088. Where should I keep my medicine? This drug is given in a hospital or clinic and will not be stored at home. NOTE: This sheet is a summary. It may not cover all possible information. If you have questions about this medicine, talk to your doctor, pharmacist, or health care provider.  2019 Elsevier/Gold Standard (2016-11-25 14:11:42)  

## 2018-01-21 NOTE — Addendum Note (Signed)
Addended by: Sherre Lain A on: 01/21/2018 11:21 AM   Modules accepted: Orders

## 2018-01-21 NOTE — Progress Notes (Signed)
   NEXPLANON REMOVAL AND RE-INSERTION Patient name: Charlotte Smith MRN [id number]  Date of birth: March 27, 1995 Subjective Findings:   Charlotte Smith is a 23 y.o. G53P1001 Caucasian female being seen today for Nexplanon removal and re-insertion. Her Nexplanon was placed 12/21/14.   No LMP recorded. Patient has had an implant. Last pap3/7/16. Results were:  normal  Risks/benefits/side effects of Nexplanon have been discussed and her questions have been answered.  Specifically, a failure rate of 01/998 has been reported, with an increased failure rate if pt takes St. John's Wort and/or antiseizure medicaitons.  She is aware of the common side effect of irregular bleeding, which the incidence of decreases over time. Signed copy of informed consent in chart.  Pertinent History Reviewed:   Reviewed past medical,surgical, social, obstetrical and family history.  Reviewed problem list, medications and allergies. Objective Findings & Procedure:    Vitals:   01/21/18 1040  BP: 104/71  Pulse: (!) 108  Weight: 152 lb (68.9 kg)  Height: 5\' 6"  (1.676 m)  Body mass index is 24.53 kg/m.  Results for orders placed or performed in visit on 01/21/18 (from the past 24 hour(s))  POCT urine pregnancy   Collection Time: 01/21/18 10:39 AM  Result Value Ref Range   Preg Test, Ur Negative Negative  Results for orders placed or performed during the hospital encounter of 01/20/18 (from the past 24 hour(s))  Creatinine clearance, urine, 24 hour   Collection Time: 01/20/18  2:00 PM  Result Value Ref Range   Urine Total Volume-CRCL 950 mL   Collection Interval-CRCL 24 hours   Creatinine, Urine 121.88 mg/dL   Creatinine, 36R Ur 4,431 600 - 1,800 mg/day   Creatinine Clearance 89 75 - 115 mL/min     Time out was performed.  Nexplanon site identified.  Area prepped in usual sterile fashon. Two cc's of 2% lidocaine was used to anesthetize the area. A small stab incision was made right beside the implant on the  distal portion.  The Nexplanon rod was grasped using hemostats and removed intact without difficulty.  The area was cleansed again with betadine and the Nexplanon was inserted approximately 10cm from the medial epicondyle and 3-5cm posterior to the sulcus per manufacturer's recommendations without difficulty.  Steri-strips and a pressure bandage was applied.  There was less than 3 cc blood loss. There were no complications.  The patient tolerated the procedure well. Assessment & Plan:   1) Nexplanon removal & re-insertion She was instructed to keep the area clean and dry, remove pressure bandage in 24 hours, and keep insertion site covered with the steri-strips for 3-5 days.  She was given a card indicating date Nexplanon was inserted and date it needs to be removed.  Follow-up PRN problems.  Orders Placed This Encounter  Procedures  . POCT urine pregnancy    Follow-up: Return in about 1 month (around 02/21/2018) for Pap & physical.  Cheral Marker CNM, [facility]NP-BC 01/21/2018 11:15 AM

## 2018-01-22 ENCOUNTER — Ambulatory Visit (HOSPITAL_COMMUNITY)
Admission: RE | Admit: 2018-01-22 | Discharge: 2018-01-22 | Disposition: A | Discharge status: Home / Self Care | Payer: Self-pay | Source: Ambulatory Visit | Attending: Nephrology | Admitting: Nephrology

## 2018-01-22 DIAGNOSIS — N289 Disorder of kidney and ureter, unspecified: Secondary | ICD-10-CM | POA: Insufficient documentation

## 2018-01-22 LAB — UPEP/UIFE/LIGHT CHAINS/TP, 24-HR UR
% BETA, Urine: 7.3 %
ALPHA 1 URINE: 3.2 %
Albumin, U: 75.7 %
Alpha 2, Urine: 5.5 %
Free Kappa Lt Chains,Ur: 14.7 mg/L (ref 1.35–24.19)
Free Kappa/Lambda Ratio: 8.31 (ref 2.04–10.37)
Free Lambda Lt Chains,Ur: 1.77 mg/L (ref 0.24–6.66)
GAMMA GLOBULIN URINE: 8.3 %
Total Protein, Urine-Ur/day: 416 mg/24 hr — ABNORMAL HIGH (ref 30–150)
Total Protein, Urine: 43.8 mg/dL
Total Volume: 950

## 2018-01-22 LAB — URINE CULTURE: Culture: >=100000 — AB

## 2018-01-22 LAB — COMPLEMENT, TOTAL: Compl, Total (CH50): >60 U/mL (ref 42–999999)

## 2018-01-28 ENCOUNTER — Encounter (HOSPITAL_COMMUNITY): Payer: Self-pay | Admitting: Emergency Medicine

## 2018-01-28 ENCOUNTER — Other Ambulatory Visit: Payer: Self-pay

## 2018-01-28 ENCOUNTER — Emergency Department (HOSPITAL_COMMUNITY)
Admission: EM | Admit: 2018-01-28 | Discharge: 2018-01-28 | Disposition: A | Discharge status: Home / Self Care | Payer: Self-pay | Attending: Emergency Medicine | Admitting: Emergency Medicine

## 2018-01-28 DIAGNOSIS — R112 Nausea with vomiting, unspecified: Secondary | ICD-10-CM | POA: Insufficient documentation

## 2018-01-28 DIAGNOSIS — K529 Noninfective gastroenteritis and colitis, unspecified: Secondary | ICD-10-CM

## 2018-01-28 LAB — CBC
HEMATOCRIT: 43.5 % (ref 36.0–46.0)
HEMOGLOBIN: 14.5 g/dL (ref 12.0–15.0)
MCH: 28.2 pg (ref 26.0–34.0)
MCHC: 33.3 g/dL (ref 30.0–36.0)
MCV: 84.6 fL (ref 80.0–100.0)
Platelets: 260 10*3/uL (ref 150–400)
RBC: 5.14 MIL/uL — ABNORMAL HIGH (ref 3.87–5.11)
RDW: 12.5 % (ref 11.5–15.5)
WBC: 19.2 10*3/uL — ABNORMAL HIGH (ref 4.0–10.5)
nRBC: 0 % (ref 0.0–0.2)

## 2018-01-28 LAB — COMPREHENSIVE METABOLIC PANEL
ALBUMIN: 5.1 g/dL — AB (ref 3.5–5.0)
ALT: 14 U/L (ref 0–44)
AST: 15 U/L (ref 15–41)
Alkaline Phosphatase: 56 U/L (ref 38–126)
Anion gap: 12 (ref 5–15)
BUN: 23 mg/dL — ABNORMAL HIGH (ref 6–20)
CO2: 21 mmol/L — ABNORMAL LOW (ref 22–32)
Calcium: 9.8 mg/dL (ref 8.9–10.3)
Chloride: 105 mmol/L (ref 98–111)
Creatinine, Ser: 1.21 mg/dL — ABNORMAL HIGH (ref 0.44–1.00)
GFR calc Af Amer: >60 mL/min (ref >60)
GFR calc non Af Amer: >60 mL/min (ref >60)
Glucose, Bld: 124 mg/dL — ABNORMAL HIGH (ref 70–99)
POTASSIUM: 3.5 mmol/L (ref 3.5–5.1)
Sodium: 138 mmol/L (ref 135–145)
Total Bilirubin: 1.1 mg/dL (ref 0.3–1.2)
Total Protein: 8.7 g/dL — ABNORMAL HIGH (ref 6.5–8.1)

## 2018-01-28 LAB — LIPASE, BLOOD: Lipase: 25 U/L (ref 11–51)

## 2018-01-28 LAB — I-STAT BETA HCG BLOOD, ED (MC, WL, AP ONLY): I-stat hCG, quantitative: <5 m[IU]/mL (ref <5)

## 2018-01-28 MED ORDER — ONDANSETRON HCL 4 MG/2ML IJ SOLN
4.0000 mg | Freq: Once | INTRAMUSCULAR | Status: AC | PRN
  Administered 2018-01-28: 4 mg via INTRAVENOUS
  Filled 2018-01-28: qty 2

## 2018-01-28 MED ORDER — ONDANSETRON 8 MG PO TBDP: ORAL_TABLET | ORAL | 0 refills | Status: DC

## 2018-01-28 MED ORDER — KETOROLAC TROMETHAMINE 30 MG/ML IJ SOLN: 30.0000 mg | Freq: Once | INTRAMUSCULAR | Status: DC

## 2018-01-28 NOTE — ED Provider Notes (Signed)
[facility] EMERGENCY DEPARTMENT Provider Note   CSN: [id number] Arrival date & time: 01/28/18  0428     History   Chief Complaint Chief Complaint  Patient presents with  . Emesis    HPI Charlotte Smith is a 23 y.o. female.  Patient is a 23 year old female with history of solitary kidney.  She presents today for evaluation of nausea, vomiting, and diarrhea.  This began earlier this evening and started abruptly after returning home from work.  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  She denies any bloody stool or vomit.  She denies any fevers or chills.  The history is provided by the patient.  Emesis  Severity:  Moderate Duration:  8 hours Timing:  Constant Quality:  Stomach contents Progression:  Unchanged Chronicity:  New Recent urination:  Normal Relieved by:  Nothing Worsened by:  Nothing Ineffective treatments:  None tried Associated symptoms: diarrhea   Associated symptoms: no abdominal pain, no chills and no fever     Past Medical History:  Diagnosis Date  . Renal disorder    non-functioning left kidney    Patient Active Problem List   Diagnosis Date Noted  . Nexplanon insertion 12/21/2014  . S/P cesarean section 10/30/2014  . Renal disease in pregnancy 03/13/2014    Past Surgical History:  Procedure Laterality Date  . BLADDER SURGERY    . CESAREAN SECTION N/A 10/30/2014   Procedure: CESAREAN SECTION;  Surgeon: Lazaro Arms, MD;  Location: [facility];  Service: Obstetrics;  Laterality: N/A;     OB History    Gravida  1   Para  1   Term  1   Preterm      AB      Living  1     SAB      TAB      Ectopic      Multiple  0   Live Births  1            Home Medications    Prior to Admission medications   Medication Sig Start Date End Date Taking? Authorizing Provider  etonogestrel (NEXPLANON) 68 MG IMPL implant 1 each by Subdermal route once.    [provider]    Family  History Family History  Problem Relation Age of Onset  . Depression Mother   . Hypertension Father   . Cancer Maternal Grandmother        lung  . Cancer Paternal Grandfather        testicular, lung    Social History Social History   Tobacco Use  . Smoking status: Never Smoker  . Smokeless tobacco: Never Used  Substance Use Topics  . Alcohol use: No  . Drug use: No     Allergies   Patient has no known allergies.   Review of Systems Review of Systems  Constitutional: Negative for chills and fever.  Gastrointestinal: Positive for diarrhea and vomiting. Negative for abdominal pain.  All other systems reviewed and are negative.    Physical Exam Updated Vital Signs BP 102/68 (BP Location: Left Arm)   Pulse (!) 116   Temp 97.7 F (36.5 C) (Oral)   Resp 16   Ht 5\' 6"  (1.676 m)   Wt 70.3 kg   SpO2 98%   BMI 25.02 kg/m   Physical Exam Vitals signs and nursing note reviewed.  Constitutional:      General: She is not in acute distress.  Appearance: She is well-developed. She is not diaphoretic.  HENT:     Head: Normocephalic and atraumatic.     Mouth/Throat:     Mouth: Mucous membranes are moist.  Neck:     Musculoskeletal: Normal range of motion and neck supple.  Cardiovascular:     Rate and Rhythm: Normal rate and regular rhythm.     Heart sounds: No murmur. No friction rub. No gallop.   Pulmonary:     Effort: Pulmonary effort is normal. No respiratory distress.     Breath sounds: Normal breath sounds. No wheezing.  Abdominal:     General: Bowel sounds are normal. There is no distension.     Palpations: Abdomen is soft.     Tenderness: There is no abdominal tenderness.  Musculoskeletal: Normal range of motion.  Skin:    General: Skin is warm and dry.  Neurological:     Mental Status: She is alert and oriented to person, place, and time.      ED Treatments / Results  Labs (all labs ordered are listed, but only abnormal results are  displayed) Labs Reviewed  LIPASE, BLOOD  COMPREHENSIVE METABOLIC PANEL  CBC  URINALYSIS, ROUTINE W REFLEX MICROSCOPIC  PREGNANCY, URINE  I-STAT BETA HCG BLOOD, ED ([facility], [facility], [facility] ONLY)    EKG None  Radiology No results found.  Procedures Procedures (including critical care time)  Medications Ordered in ED Medications  ondansetron (ZOFRAN) injection 4 mg (has no administration in time range)  ketorolac (TORADOL) 30 MG/ML injection 30 mg (has no administration in time range)     Initial Impression / Assessment and Plan / ED Course  I have reviewed the triage vital signs and the nursing notes.  Pertinent labs & imaging results that were available during my care of the patient were reviewed by me and considered in my medical decision making (see chart for details).  Patient's presentation, exam, work-up, and response to medications most consistent with an acute viral gastroenteritis.  Her abdomen is benign and she is feeling better after IV fluids and Zofran.  I see no indication for imaging or further work-up.  She will be discharged with Zofran, clear liquid diet, and return as needed.  Final Clinical Impressions(s) / ED Diagnoses   Final diagnoses:  None    ED Discharge Orders    None       Geoffery Lyonselo, Percell Lamboy, MD 01/28/18 [phone]

## 2018-01-28 NOTE — ED Triage Notes (Signed)
Pt states she go off work at 11pm on 1/8 and has been having diarrhea and vomiting since. Pt states shes not able to keep anything down. Denies fever

## 2018-01-28 NOTE — Discharge Instructions (Addendum)
Zofran as prescribed as needed for nausea.  Clear liquid diet for the next 12 hours, then slowly advance to normal as tolerated.  Return to the emergency department if you develop severe abdominal pain, high fever, bloody stool, or other new and concerning symptoms.

## 2018-02-22 ENCOUNTER — Other Ambulatory Visit: Payer: No Typology Code available for payment source | Admitting: Women's Health

## 2018-03-24 ENCOUNTER — Other Ambulatory Visit: Payer: Self-pay | Admitting: Women's Health

## 2018-12-07 ENCOUNTER — Other Ambulatory Visit: Payer: Self-pay

## 2018-12-07 DIAGNOSIS — Z20822 Contact with and (suspected) exposure to covid-19: Secondary | ICD-10-CM

## 2018-12-09 ENCOUNTER — Telehealth: Payer: Self-pay | Admitting: Internal Medicine

## 2018-12-09 LAB — NOVEL CORONAVIRUS, NAA: SARS-CoV-2, NAA: NOT DETECTED

## 2018-12-09 NOTE — Telephone Encounter (Signed)
Negative COVID results given. Patient results "NOT Detected." Caller expressed understanding.p

## 2019-01-24 ENCOUNTER — Ambulatory Visit (INDEPENDENT_AMBULATORY_CARE_PROVIDER_SITE_OTHER): Payer: No Typology Code available for payment source | Admitting: Advanced Practice Midwife

## 2019-01-24 ENCOUNTER — Encounter: Payer: Self-pay | Admitting: Women's Health

## 2019-01-24 ENCOUNTER — Encounter: Payer: Self-pay | Admitting: Advanced Practice Midwife

## 2019-01-24 ENCOUNTER — Other Ambulatory Visit: Payer: Self-pay

## 2019-01-24 VITALS — BP 110/66 | HR 81 | Ht 66.0 in | Wt 160.0 lb

## 2019-01-24 DIAGNOSIS — Z3046 Encounter for surveillance of implantable subdermal contraceptive: Secondary | ICD-10-CM

## 2019-01-24 MED ORDER — PRENATAL VITAMIN 27-0.8 MG PO TABS: 1.0000 | ORAL_TABLET | Freq: Every day | ORAL | 12 refills | Status: DC

## 2019-01-24 NOTE — Progress Notes (Signed)
HPI:  Charlotte Smith 24 y.o. here for Nexplanon removal.  Her future plans for birth control are condoms for a while. She has gained weight and wants to get her period back regular--may consider pregnancy. .  Past Medical History: Past Medical History:  Diagnosis Date  . Renal disorder    non-functioning left kidney    Past Surgical History: Past Surgical History:  Procedure Laterality Date  . BLADDER SURGERY    . CESAREAN SECTION N/A 10/30/2014   Procedure: CESAREAN SECTION;  Surgeon: Lazaro Arms, MD;  Location: [facility];  Service: Obstetrics;  Laterality: N/A;    Family History: Family History  Problem Relation Age of Onset  . Depression Mother   . Hypertension Father   . Cancer Maternal Grandmother        lung  . Cancer Paternal Grandfather        testicular, lung    Social History: Social History   Tobacco Use  . Smoking status: Never Smoker  . Smokeless tobacco: Never Used  Substance Use Topics  . Alcohol use: No  . Drug use: No    Allergies: No Known Allergies  Meds: (Not in a hospital admission)     Patient given informed consent for removal of her Nexplanon, time out was performed.  Signed copy in the chart.  Appropriate time out taken. Implanon site identified.  Area prepped in usual sterile fashon. One cc of 1% lidocaine was used to anesthetize the area at the distal end of the implant. A small stab incision was made right beside the implant on the distal portion.  The Nexplanon rod was grasped using hemostats and removed without difficulty.  There was less than 3 cc blood loss. There were no complications.  A small amount of antibiotic ointment and steri-strips were applied over the small incision.  A pressure bandage was applied to reduce any bruising.  The patient tolerated the procedure well and was given post procedure instructions.

## 2019-06-21 ENCOUNTER — Encounter: Payer: Self-pay | Admitting: Adult Health

## 2019-06-21 ENCOUNTER — Ambulatory Visit: Payer: No Typology Code available for payment source | Admitting: Adult Health

## 2019-06-21 VITALS — BP 108/67 | HR 82 | Ht 66.0 in | Wt 165.0 lb

## 2019-06-21 DIAGNOSIS — R319 Hematuria, unspecified: Secondary | ICD-10-CM | POA: Diagnosis not present

## 2019-06-21 DIAGNOSIS — Z3A01 Less than 8 weeks gestation of pregnancy: Secondary | ICD-10-CM | POA: Insufficient documentation

## 2019-06-21 DIAGNOSIS — O219 Vomiting of pregnancy, unspecified: Secondary | ICD-10-CM | POA: Insufficient documentation

## 2019-06-21 DIAGNOSIS — R8290 Unspecified abnormal findings in urine: Secondary | ICD-10-CM | POA: Diagnosis not present

## 2019-06-21 DIAGNOSIS — N390 Urinary tract infection, site not specified: Secondary | ICD-10-CM | POA: Diagnosis not present

## 2019-06-21 DIAGNOSIS — N926 Irregular menstruation, unspecified: Secondary | ICD-10-CM

## 2019-06-21 DIAGNOSIS — O3680X0 Pregnancy with inconclusive fetal viability, not applicable or unspecified: Secondary | ICD-10-CM

## 2019-06-21 DIAGNOSIS — Z98891 History of uterine scar from previous surgery: Secondary | ICD-10-CM

## 2019-06-21 DIAGNOSIS — O26831 Pregnancy related renal disease, first trimester: Secondary | ICD-10-CM

## 2019-06-21 DIAGNOSIS — Z3201 Encounter for pregnancy test, result positive: Secondary | ICD-10-CM | POA: Diagnosis not present

## 2019-06-21 LAB — POCT URINALYSIS DIPSTICK OB
Glucose, UA: NEGATIVE
Ketones, UA: NEGATIVE
Nitrite, UA: POSITIVE

## 2019-06-21 LAB — POCT URINE PREGNANCY: Preg Test, Ur: POSITIVE — AB

## 2019-06-21 MED ORDER — ONDANSETRON 4 MG PO TBDP: 4.0000 mg | ORAL_TABLET | Freq: Three times a day (TID) | ORAL | 3 refills | Status: DC | PRN

## 2019-06-21 MED ORDER — SULFAMETHOXAZOLE-TRIMETHOPRIM 800-160 MG PO TABS: 1.0000 | ORAL_TABLET | Freq: Two times a day (BID) | ORAL | 0 refills | Status: DC

## 2019-06-21 MED ORDER — OMEPRAZOLE 10 MG PO CPDR: DELAYED_RELEASE_CAPSULE | ORAL | 3 refills | Status: DC

## 2019-06-21 NOTE — Progress Notes (Signed)
  Subjective:     Patient ID: Charlotte Smith, female   DOB: November 20, 1995, 24 y.o.   MRN: [id number]  HPI Charlotte Smith is a 24 year old white female, married, in for UPT, has missed a period and had 3+HPTs. Has bad heartburn and nausea and vomiting, and has been taking daughter's zofran and had it last pregnancy too.  She had prior C section for breech She only has 1 functioning kidney and was on bactrim last pregnancy. 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  Review of Systems +missed period 3+HPTs Has heartburn  +nausea and vomiting, taking daughter's zofran  Reviewed past medical,surgical, social and family history. Reviewed medications and allergies.     Objective:   Physical Exam BP 108/67 (BP Location: Left Arm, Patient Position: Sitting, Cuff Size: Normal)   Pulse 82   Ht 5\' 6"  (1.676 m)   Wt 165 lb (74.8 kg)   LMP 05/02/2019   BMI 26.63 kg/m  urine dipstick +nitrates, large blood and small leuks UPT +, about 7+1 week by LMP, with EDD 02/06/20.Skin warm and dry. Neck: mid line trachea, normal thyroid, good ROM, no lymphadenopathy noted. Lungs: clear to ausculation bilaterally. Cardiovascular: regular rate and rhythm.Abdomen is soft and non tender.     Assessment:     1. Missed period Take PNV  2. Foul smelling urine +nitrates UA C&S sent  3. Less than [redacted] weeks gestation of pregnancy   4. Urinary tract infection with hematuria, site unspecified UA C&S sent Will rx septra ds  5. Renal disease during pregnancy in first trimester  6. Nausea and vomiting during pregnancy prior to [redacted] weeks gestation Will rx zofran 60m ODT Eat small frequent pills Will rx prilosec Chew gum and suck on hard candy  Meds ordered this encounter  Medications  . sulfamethoxazole-trimethoprim (BACTRIM DS) 800-160 MG tablet    Sig: Take 1 tablet by mouth 2 (two) times daily. Take 1 bid    Dispense:  14 tablet    Refill:  0    Order Specific Question:   Supervising Provider    Answer:    11m, LUTHER H [[id number]]  . ondansetron (ZOFRAN ODT) 4 MG disintegrating tablet    Sig: Take 1 tablet (4 mg total) by mouth every 8 (eight) hours as needed for nausea or vomiting.    Dispense:  30 tablet    Refill:  3    Order Specific Question:   Supervising Provider    Answer:   Despina Hidden, LUTHER H [[id number]]  . omeprazole (PRILOSEC) 10 MG capsule    Sig: Take 1 bid    Dispense:  60 capsule    Refill:  3    Order Specific Question:   Supervising Provider    Answer:   EURE, LUTHER H [[id number]]    7. Encounter to determine fetal viability of pregnancy, single or unspecified fetus Return in 2 weeks for dating Despina Hidden  8. History of cesarean section     Plan:     Review handout by [facility]

## 2019-06-26 LAB — URINE CULTURE

## 2019-07-06 ENCOUNTER — Other Ambulatory Visit: Payer: No Typology Code available for payment source

## 2019-07-11 ENCOUNTER — Ambulatory Visit (INDEPENDENT_AMBULATORY_CARE_PROVIDER_SITE_OTHER): Payer: No Typology Code available for payment source

## 2019-07-11 DIAGNOSIS — Z3A09 9 weeks gestation of pregnancy: Secondary | ICD-10-CM | POA: Diagnosis not present

## 2019-07-11 DIAGNOSIS — O3680X0 Pregnancy with inconclusive fetal viability, not applicable or unspecified: Secondary | ICD-10-CM

## 2019-07-11 NOTE — Progress Notes (Signed)
[place] 9+6 wks,single IUP with YS,fhr 164 bpm,crl 30.62 mm,normal right ovary,simple left corpus luteal cyst 2.7 x 2.4 x 2.4 cm

## 2019-07-21 ENCOUNTER — Encounter: Payer: Self-pay | Admitting: Women's Health

## 2019-07-21 DIAGNOSIS — Z98891 History of uterine scar from previous surgery: Secondary | ICD-10-CM | POA: Insufficient documentation

## 2019-07-29 ENCOUNTER — Other Ambulatory Visit: Payer: Self-pay | Admitting: Obstetrics and Gynecology

## 2019-07-29 DIAGNOSIS — Z3682 Encounter for antenatal screening for nuchal translucency: Secondary | ICD-10-CM

## 2019-08-01 ENCOUNTER — Encounter: Payer: Self-pay | Admitting: Women's Health

## 2019-08-01 ENCOUNTER — Ambulatory Visit (INDEPENDENT_AMBULATORY_CARE_PROVIDER_SITE_OTHER): Payer: No Typology Code available for payment source | Admitting: Women's Health

## 2019-08-01 ENCOUNTER — Ambulatory Visit (INDEPENDENT_AMBULATORY_CARE_PROVIDER_SITE_OTHER): Payer: No Typology Code available for payment source

## 2019-08-01 ENCOUNTER — Other Ambulatory Visit (HOSPITAL_COMMUNITY)
Admission: RE | Admit: 2019-08-01 | Discharge: 2019-08-01 | Disposition: A | Discharge status: Home / Self Care | Payer: No Typology Code available for payment source | Source: Ambulatory Visit | Attending: Obstetrics and Gynecology | Admitting: Obstetrics and Gynecology

## 2019-08-01 VITALS — BP 118/65 | HR 89 | Wt 167.0 lb

## 2019-08-01 DIAGNOSIS — N189 Chronic kidney disease, unspecified: Secondary | ICD-10-CM

## 2019-08-01 DIAGNOSIS — O2340 Unspecified infection of urinary tract in pregnancy, unspecified trimester: Secondary | ICD-10-CM | POA: Insufficient documentation

## 2019-08-01 DIAGNOSIS — Z3682 Encounter for antenatal screening for nuchal translucency: Secondary | ICD-10-CM | POA: Diagnosis not present

## 2019-08-01 DIAGNOSIS — Z1389 Encounter for screening for other disorder: Secondary | ICD-10-CM

## 2019-08-01 DIAGNOSIS — Z3A12 12 weeks gestation of pregnancy: Secondary | ICD-10-CM

## 2019-08-01 DIAGNOSIS — Z124 Encounter for screening for malignant neoplasm of cervix: Secondary | ICD-10-CM

## 2019-08-01 DIAGNOSIS — O99891 Other specified diseases and conditions complicating pregnancy: Secondary | ICD-10-CM

## 2019-08-01 DIAGNOSIS — Z1379 Encounter for other screening for genetic and chromosomal anomalies: Secondary | ICD-10-CM

## 2019-08-01 DIAGNOSIS — O0990 Supervision of high risk pregnancy, unspecified, unspecified trimester: Secondary | ICD-10-CM | POA: Insufficient documentation

## 2019-08-01 DIAGNOSIS — O26831 Pregnancy related renal disease, first trimester: Secondary | ICD-10-CM

## 2019-08-01 DIAGNOSIS — Z98891 History of uterine scar from previous surgery: Secondary | ICD-10-CM

## 2019-08-01 DIAGNOSIS — Z3481 Encounter for supervision of other normal pregnancy, first trimester: Secondary | ICD-10-CM

## 2019-08-01 DIAGNOSIS — R8271 Bacteriuria: Secondary | ICD-10-CM

## 2019-08-01 DIAGNOSIS — Z331 Pregnant state, incidental: Secondary | ICD-10-CM

## 2019-08-01 MED ORDER — CEPHALEXIN 500 MG PO CAPS: 500.0000 mg | ORAL_CAPSULE | Freq: Every day | ORAL | 6 refills | Status: DC

## 2019-08-01 NOTE — Progress Notes (Signed)
INITIAL OBSTETRICAL VISIT Patient name: Charlotte Smith MRN [id number]  Date of birth: September 09, 1995 Chief Complaint:   Routine Prenatal Visit  History of Present Illness:   Charlotte Smith is a 24 y.o. G31P1001 Caucasian female at [redacted]w[redacted]d by LMP c/w u/s at 9 weeks with an Estimated Date of Delivery: 02/07/20 being seen today for her initial obstetrical visit.   Her obstetrical history is significant for PCS for breech, declined ECV.   Today she reports n/v worse at night, zofran helps.  Chronic kidney disease, Rt kidney deteriorated from reflux, frequent UTIs, last treated 06/21/19. Renal u/s 01/22/18: atrophic Rt kidney, compensatory hypertrophy Lt kidney.  Depression screen [facility] 2/9 08/01/2019 08/01/2019  Decreased Interest 0 0  Down, Depressed, Hopeless 0 0  PHQ - 2 Score 0 0  Altered sleeping 0 0  Tired, decreased energy 2 2  Change in appetite 2 2  Feeling bad or failure about yourself  0 0  Trouble concentrating 0 0  Moving slowly or fidgety/restless 0 0  Suicidal thoughts 0 0  PHQ-9 Score 4 4    Patient's last menstrual period was 05/03/2019 (approximate). Last pap 03/27/14. Results were: normal Review of Systems:   Pertinent items are noted in HPI Denies cramping/contractions, leakage of fluid, vaginal bleeding, abnormal vaginal discharge w/ itching/odor/irritation, headaches, visual changes, shortness of breath, chest pain, abdominal pain, severe nausea/vomiting, or problems with urination or bowel movements unless otherwise stated above.  Pertinent History Reviewed:  Reviewed past medical,surgical, social, obstetrical and family history.  Reviewed problem list, medications and allergies. OB History  Gravida Para Term Preterm AB Living  2 1 1    0 1  SAB TAB Ectopic Multiple Live Births  0     0 1    # Outcome Date GA Lbr Len/2nd Weight Sex Delivery Anes PTL Lv  2 Current           1 Term 10/30/14 [redacted]w[redacted]d  6 lb 8.6 oz (2.965 kg) F CS-LTranv Spinal  LIV   Physical Assessment:    Vitals:   08/01/19 1453  BP: 118/65  Pulse: 89  Weight: 167 lb (75.8 kg)  Body mass index is 26.95 kg/m.       Physical Examination:  General appearance - well appearing, and in no distress  Mental status - alert, oriented to person, place, and time  Psych:  She has a normal mood and affect  Skin - warm and dry, normal color, no suspicious lesions noted  Chest - effort normal, all lung fields clear to auscultation bilaterally  Heart - normal rate and regular rhythm  Abdomen - soft, nontender  Extremities:  No swelling or varicosities noted  Pelvic - VULVA: normal appearing vulva with no masses, tenderness or lesions  VAGINA: normal appearing vagina with normal color and discharge, no lesions  CERVIX: normal appearing cervix without discharge or lesions, no CMT  Thin prep pap is done w/ HR HPV cotesting  TODAY'S NT 10/02/19 12+6 wks,measurements c/w dates, crl 73.03 mm,NB present,NT 1.7 mm,FHR 170 bpm,normal right ovary,simple left corpus luteal cyst 2.5 x 2.3 x 2.4 cm,posterior placenta,  No results found for this or any previous visit (from the past 24 hour(s)).  Assessment & Plan:  1) Low-Risk Pregnancy G2P1011 at [redacted]w[redacted]d with an Estimated Date of Delivery: 02/07/20   2) Initial OB visit  3) Chronic kidney disease> deteriorated Rt kidney d/t VUR, frequent uti's- last treated 06/21/19, send cx today, rx keflex qhs for suppression. P:C and CMP today.  4) Prev c/s> for breech, wants RCS w/ BTL  Meds:  Meds ordered this encounter  Medications  . cephALEXin (KEFLEX) 500 MG capsule    Sig: Take 1 capsule (500 mg total) by mouth at bedtime. X 7 days    Dispense:  30 capsule    Refill:  6    Order Specific Question:   Supervising Provider    Answer:   Duane Lope H [[id number]]    Initial labs obtained Continue prenatal vitamins Reviewed n/v relief measures and warning s/s to report Reviewed recommended weight gain based on pre-gravid BMI Encouraged well-balanced diet Genetic & carrier  screening discussed: requests Panorama and NT/IT, declines Horizon 14  Ultrasound discussed; fetal survey: requested CCNC completed> form faxed if has or is planning to apply for medicaid The nature of [facility] [employer] with multiple MDs and other Advanced Practice Providers was explained to patient; also emphasized that fellows, residents, and students are part of our team. Has access to bp cuff at work.   Indications for ASA therapy (per uptodate) One of the following: Chronic kidney disease Yes   Follow-up: Return in about 3 weeks (around 08/22/2019) for LROB, 2nd IT, CNM, in person.   Orders Placed This Encounter  Procedures  . Urine Culture  . Integrated 1  . Pain Management Screening Profile (10S)  . Genetic Screening  . CBC/D/Plt+RPR+Rh+ABO+Rub Ab...  . Protein / creatinine ratio, urine  . Comprehensive metabolic panel    Cheral Marker CNM, [facility] 08/01/2019 3:55 PM

## 2019-08-01 NOTE — Patient Instructions (Addendum)
Charlotte Smith, I greatly value your feedback.  If you receive a survey following your visit with [place] today, we appreciate you taking the time to fill it out.  Thanks, [name], CNM, WHNP-BC   [facility] ([address] [place], [place] [address]) Entrance C, located off of [address] valet parking   Begin taking a 81mg  baby aspirin daily to decrease the risk of preeclampsia during pregnancy   Nausea & Vomiting  Have saltine crackers or pretzels by your bed and eat a few bites before you raise your head out of bed in the morning  Eat small frequent meals throughout the day instead of large meals  Drink plenty of fluids throughout the day to stay hydrated, just don't drink a lot of fluids with your meals.  This can make your stomach fill up faster making you feel sick  Do not brush your teeth right after you eat  Products with real ginger are good for nausea, like ginger ale and ginger hard candy Make sure it says made with real ginger!  Sucking on sour candy like lemon heads is also good for nausea  If your prenatal vitamins make you nauseated, take them at night so you will sleep through the nausea  Sea Bands  If you feel like you need medicine for the nausea & vomiting please let know  If you are unable to keep any fluids or food down please let us know   Constipation  Drink plenty of fluid, preferably water, throughout the day  Eat foods high in fiber such as fruits, vegetables, and grains  Exercise, such as walking, is a good way to keep your bowels regular  Drink warm fluids, especially warm prune juice, or decaf coffee  Eat a 1/2 cup of real oatmeal (not instant), 1/2 cup applesauce, and 1/2-1 cup warm prune juice every day  If needed, you may take Colace (docusate sodium) stool softener once or twice a day to help keep the stool soft.   If you still are having problems with constipation, you may take Miralax once  daily as needed to help keep your bowels regular.   Home Blood Pressure Monitoring for Patients   Your provider has recommended that you check your blood pressure (BP) at least once a week at home. If you do not have a blood pressure cuff at home, one will be provided for you. Contact your provider if you have not received your monitor within 1 week.   Helpful Tips for Accurate Home Blood Pressure Checks  . Don't smoke, exercise, or drink caffeine 30 minutes before checking your BP . Use the restroom before checking your BP (a full bladder can raise your pressure) . Relax in a comfortable upright chair . Feet on the ground . Left arm resting comfortably on a flat surface at the level of your heart . Legs uncrossed . Back supported . Sit quietly and don't talk . Place the cuff on your bare arm . Adjust snuggly, so that only two fingertips can fit between your skin and the top of the cuff . Check 2 readings separated by at least one minute . Keep a log of your BP readings . For a visual, please reference this diagram: http://ccnc.care/bpdiagram  Provider Name: [facility]     Phone: [phone]  Zone 1: ALL CLEAR  Continue to monitor your symptoms:  . BP reading is less than 140 (top number) or  less than 90 (bottom number)  . No right upper stomach pain . No headaches or seeing spots . No feeling nauseated or throwing up . No swelling in face and hands  Zone 2: CAUTION Call your doctor's office for any of the following:  . BP reading is greater than 140 (top number) or greater than 90 (bottom number)  . Stomach pain under your ribs in the middle or right side . Headaches or seeing spots . Feeling nauseated or throwing up . Swelling in face and hands  Zone 3: EMERGENCY  Seek immediate medical care if you have any of the following:  . BP reading is greater than160 (top number) or greater than 110 (bottom number) . Severe headaches not improving with Tylenol . Serious  difficulty catching your breath . Any worsening symptoms from Zone 2    First Trimester of Pregnancy The first trimester of pregnancy is from week 1 until the end of week 12 (months 1 through 3). A week after a sperm fertilizes an egg, the egg will implant on the wall of the uterus. This embryo will begin to develop into a baby. Genes from you and your partner are forming the baby. The female genes determine whether the baby is a boy or a girl. At 6-8 weeks, the eyes and face are formed, and the heartbeat can be seen on ultrasound. At the end of 12 weeks, all the baby's organs are formed.  Now that you are pregnant, you will want to do everything you can to have a healthy baby. Two of the most important things are to get good prenatal care and to follow your health care provider's instructions. Prenatal care is all the medical care you receive before the baby's birth. This care will help prevent, find, and treat any problems during the pregnancy and childbirth. BODY CHANGES Your body goes through many changes during pregnancy. The changes vary from woman to woman.   You may gain or lose a couple of pounds at first.  You may feel sick to your stomach (nauseous) and throw up (vomit). If the vomiting is uncontrollable, call your health care provider.  You may tire easily.  You may develop headaches that can be relieved by medicines approved by your health care provider.  You may urinate more often. Painful urination may mean you have a bladder infection.  You may develop heartburn as a result of your pregnancy.  You may develop constipation because certain hormones are causing the muscles that push waste through your intestines to slow down.  You may develop hemorrhoids or swollen, bulging veins (varicose veins).  Your breasts may begin to grow larger and become tender. Your nipples may stick out more, and the tissue that surrounds them (areola) may become darker.  Your gums may bleed and may  be sensitive to brushing and flossing.  Dark spots or blotches (chloasma, mask of pregnancy) may develop on your face. This will likely fade after the baby is born.  Your menstrual periods will stop.  You may have a loss of appetite.  You may develop cravings for certain kinds of food.  You may have changes in your emotions from day to day, such as being excited to be pregnant or being concerned that something may go wrong with the pregnancy and baby.  You may have more vivid and strange dreams.  You may have changes in your hair. These can include thickening of your hair, rapid growth, and changes in texture. Some women  also have hair loss during or after pregnancy, or hair that feels dry or thin. Your hair will most likely return to normal after your baby is born. WHAT TO EXPECT AT YOUR PRENATAL VISITS During a routine prenatal visit:  You will be weighed to make sure you and the baby are growing normally.  Your blood pressure will be taken.  Your abdomen will be measured to track your baby's growth.  The fetal heartbeat will be listened to starting around week 10 or 12 of your pregnancy.  Test results from any previous visits will be discussed. Your health care provider may ask you:  How you are feeling.  If you are feeling the baby move.  If you have had any abnormal symptoms, such as leaking fluid, bleeding, severe headaches, or abdominal cramping.  If you have any questions. Other tests that may be performed during your first trimester include:  Blood tests to find your blood type and to check for the presence of any previous infections. They will also be used to check for low iron levels (anemia) and Rh antibodies. Later in the pregnancy, blood tests for diabetes will be done along with other tests if problems develop.  Urine tests to check for infections, diabetes, or protein in the urine.  An ultrasound to confirm the proper growth and development of the baby.  An  amniocentesis to check for possible genetic problems.  Fetal screens for spina bifida and Down syndrome.  You may need other tests to make sure you and the baby are doing well. HOME CARE INSTRUCTIONS  Medicines  Follow your health care provider's instructions regarding medicine use. Specific medicines may be either safe or unsafe to take during pregnancy.  Take your prenatal vitamins as directed.  If you develop constipation, try taking a stool softener if your health care provider approves. Diet  Eat regular, well-balanced meals. Choose a variety of foods, such as meat or vegetable-based protein, fish, milk and low-fat dairy products, vegetables, fruits, and whole grain breads and cereals. Your health care provider will help you determine the amount of weight gain that is right for you.  Avoid raw meat and uncooked cheese. These carry germs that can cause birth defects in the baby.  Eating four or five small meals rather than three large meals a day may help relieve nausea and vomiting. If you start to feel nauseous, eating a few soda crackers can be helpful. Drinking liquids between meals instead of during meals also seems to help nausea and vomiting.  If you develop constipation, eat more high-fiber foods, such as fresh vegetables or fruit and whole grains. Drink enough fluids to keep your urine clear or pale yellow. Activity and Exercise  Exercise only as directed by your health care provider. Exercising will help you:  Control your weight.  Stay in shape.  Be prepared for labor and delivery.  Experiencing pain or cramping in the lower abdomen or low back is a good sign that you should stop exercising. Check with your health care provider before continuing normal exercises.  Try to avoid standing for long periods of time. Move your legs often if you must stand in one place for a long time.  Avoid heavy lifting.  Wear low-heeled shoes, and practice good posture.  You may  continue to have sex unless your health care provider directs you otherwise. Relief of Pain or Discomfort  Wear a good support bra for breast tenderness.    Take warm sitz baths to  soothe any pain or discomfort caused by hemorrhoids. Use hemorrhoid cream if your health care provider approves.    Rest with your legs elevated if you have leg cramps or low back pain.  If you develop varicose veins in your legs, wear support hose. Elevate your feet for 15 minutes, 3-4 times a day. Limit salt in your diet. Prenatal Care  Schedule your prenatal visits by the twelfth week of pregnancy. They are usually scheduled monthly at first, then more often in the last 2 months before delivery.  Write down your questions. Take them to your prenatal visits.  Keep all your prenatal visits as directed by your health care provider. Safety  Wear your seat belt at all times when driving.  Make a list of emergency phone numbers, including numbers for family, friends, the hospital, and police and fire departments. General Tips  Ask your health care provider for a referral to a local prenatal education class. Begin classes no later than at the beginning of month 6 of your pregnancy.  Ask for help if you have counseling or nutritional needs during pregnancy. Your health care provider can offer advice or refer you to specialists for help with various needs.  Do not use hot tubs, steam rooms, or saunas.  Do not douche or use tampons or scented sanitary pads.  Do not cross your legs for long periods of time.  Avoid cat litter boxes and soil used by cats. These carry germs that can cause birth defects in the baby and possibly loss of the fetus by miscarriage or stillbirth.  Avoid all smoking, herbs, alcohol, and medicines not prescribed by your health care provider. Chemicals in these affect the formation and growth of the baby.  Schedule a dentist appointment. At home, brush your teeth with a soft toothbrush  and be gentle when you floss. SEEK MEDICAL CARE IF:   You have dizziness.  You have mild pelvic cramps, pelvic pressure, or nagging pain in the abdominal area.  You have persistent nausea, vomiting, or diarrhea.  You have a bad smelling vaginal discharge.  You have pain with urination.  You notice increased swelling in your face, hands, legs, or ankles. SEEK IMMEDIATE MEDICAL CARE IF:   You have a fever.  You are leaking fluid from your vagina.  You have spotting or bleeding from your vagina.  You have severe abdominal cramping or pain.  You have rapid weight gain or loss.  You vomit blood or material that looks like coffee grounds.  You are exposed to [place] measles and have never had them.  You are exposed to fifth disease or chickenpox.  You develop a severe headache.  You have shortness of breath.  You have any kind of trauma, such as from a fall or a car accident. Document Released: 12/31/2000 Document Revised: 05/23/2013 Document Reviewed: 11/16/2012 [facility] Patient Information 2015 [employer] [place], [place]. This information is not intended to replace advice given to you by your health care provider. Make sure you discuss any questions you have with your health care provider.

## 2019-08-01 NOTE — Progress Notes (Signed)
[place] 12+6 wks,measurements c/w dates, crl 73.03 mm,NB present,NT 1.7 mm,FHR 170 bpm,normal right ovary,simple left corpus luteal cyst 2.5 x 2.3 x 2.4 cm,posterior placenta,

## 2019-08-02 ENCOUNTER — Encounter: Payer: Self-pay | Admitting: Women's Health

## 2019-08-02 ENCOUNTER — Encounter: Payer: No Typology Code available for payment source | Admitting: Women's Health

## 2019-08-02 ENCOUNTER — Other Ambulatory Visit: Payer: Self-pay | Admitting: Women's Health

## 2019-08-02 LAB — COMPREHENSIVE METABOLIC PANEL
ALT: 11 IU/L (ref 0–32)
AST: 13 IU/L (ref 0–40)
Albumin/Globulin Ratio: 1.4 (ref 1.2–2.2)
Albumin: 4.3 g/dL (ref 3.9–5.0)
Alkaline Phosphatase: 57 IU/L (ref 48–121)
BUN/Creatinine Ratio: 13 (ref 9–23)
BUN: 12 mg/dL (ref 6–20)
Bilirubin Total: <0.2 mg/dL (ref 0.0–1.2)
CO2: 22 mmol/L (ref 20–29)
Calcium: 9.7 mg/dL (ref 8.7–10.2)
Chloride: 101 mmol/L (ref 96–106)
Creatinine, Ser: 0.9 mg/dL (ref 0.57–1.00)
GFR calc Af Amer: 102 mL/min/{1.73_m2} (ref >59)
GFR calc non Af Amer: 89 mL/min/{1.73_m2} (ref >59)
Globulin, Total: 3 g/dL (ref 1.5–4.5)
Glucose: 82 mg/dL (ref 65–99)
Potassium: 4.2 mmol/L (ref 3.5–5.2)
Sodium: 139 mmol/L (ref 134–144)
Total Protein: 7.3 g/dL (ref 6.0–8.5)

## 2019-08-02 LAB — PROTEIN / CREATININE RATIO, URINE
Creatinine, Urine: 39.4 mg/dL
Protein, Ur: 33.4 mg/dL
Protein/Creat Ratio: 848 mg/g creat — ABNORMAL HIGH (ref 0–200)

## 2019-08-03 LAB — CBC/D/PLT+RPR+RH+ABO+RUB AB...
Antibody Screen: NEGATIVE
Basophils Absolute: 0 10*3/uL (ref 0.0–0.2)
Basos: 0 %
EOS (ABSOLUTE): 0.1 10*3/uL (ref 0.0–0.4)
Eos: 1 %
HCV Ab: <0.1 s/co ratio (ref 0.0–0.9)
HIV Screen 4th Generation wRfx: NONREACTIVE
Hematocrit: 36.7 % (ref 34.0–46.6)
Hemoglobin: 12.6 g/dL (ref 11.1–15.9)
Hepatitis B Surface Ag: NEGATIVE
Immature Grans (Abs): 0 10*3/uL (ref 0.0–0.1)
Immature Granulocytes: 0 %
Lymphocytes Absolute: 1.8 10*3/uL (ref 0.7–3.1)
Lymphs: 15 %
MCH: 29.7 pg (ref 26.6–33.0)
MCHC: 34.3 g/dL (ref 31.5–35.7)
MCV: 87 fL (ref 79–97)
Monocytes Absolute: 0.6 10*3/uL (ref 0.1–0.9)
Monocytes: 5 %
Neutrophils Absolute: 9.7 10*3/uL — ABNORMAL HIGH (ref 1.4–7.0)
Neutrophils: 79 %
Platelets: 242 10*3/uL (ref 150–450)
RBC: 4.24 x10E6/uL (ref 3.77–5.28)
RDW: 12.9 % (ref 11.7–15.4)
RPR Ser Ql: NONREACTIVE
Rh Factor: POSITIVE
Rubella Antibodies, IGG: 4.77 index (ref >0.99)
WBC: 12.4 10*3/uL — ABNORMAL HIGH (ref 3.4–10.8)

## 2019-08-03 LAB — INTEGRATED 1
Crown Rump Length: 73 mm
Gest. Age on Collection Date: 13.3 weeks
Maternal Age at EDD: 27 yr
Nuchal Translucency (NT): 1.7 mm
Number of Fetuses: 1
PAPP-A Value: 406.4 ng/mL
Weight: 167 [lb_av]

## 2019-08-03 LAB — HCV INTERPRETATION

## 2019-08-05 LAB — CYTOLOGY - PAP
Chlamydia: NEGATIVE
Comment: NEGATIVE
Comment: NEGATIVE
Comment: NORMAL
Diagnosis: NEGATIVE
High risk HPV: NEGATIVE
Neisseria Gonorrhea: NEGATIVE

## 2019-08-07 LAB — PMP SCREEN PROFILE (10S), URINE
Amphetamine Scrn, Ur: NEGATIVE ng/mL
BARBITURATE SCREEN URINE: NEGATIVE ng/mL
BENZODIAZEPINE SCREEN, URINE: NEGATIVE ng/mL
CANNABINOIDS UR QL SCN: NEGATIVE ng/mL
Cocaine (Metab) Scrn, Ur: NEGATIVE ng/mL
Creatinine(Crt), U: 47.3 mg/dL (ref 20.0–300.0)
Methadone Screen, Urine: NEGATIVE ng/mL
OXYCODONE+OXYMORPHONE UR QL SCN: NEGATIVE ng/mL
Opiate Scrn, Ur: NEGATIVE ng/mL
Ph of Urine: 6.4 (ref 4.5–8.9)
Phencyclidine Qn, Ur: NEGATIVE ng/mL
Propoxyphene Scrn, Ur: NEGATIVE ng/mL

## 2019-08-07 LAB — URINE CULTURE

## 2019-08-07 LAB — SPECIMEN STATUS REPORT

## 2019-08-08 ENCOUNTER — Other Ambulatory Visit: Payer: Self-pay | Admitting: Women's Health

## 2019-08-08 DIAGNOSIS — O99891 Other specified diseases and conditions complicating pregnancy: Secondary | ICD-10-CM

## 2019-08-08 IMAGING — US RENAL/URINARY TRACT ULTRASOUND
1 series · 14 of 25 positions shown · non-contrast
Comparison: 10/19/2014

CLINICAL DATA: Nonfunctioning LEFT kidney, chronic kidney disease

EXAM:
RENAL / URINARY TRACT ULTRASOUND COMPLETE

[Series 1: renal/urinary tract ultrasound · 0.23mm/px · 14 of 45 slices shown]
[im 1/45]
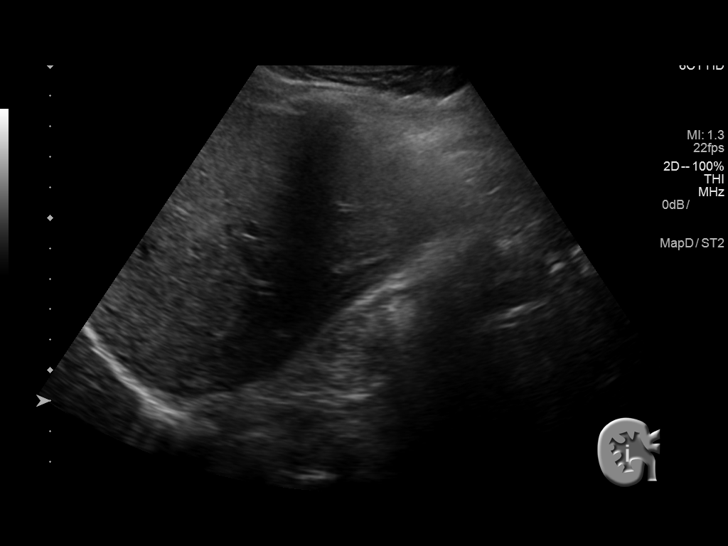
[im 4/45]
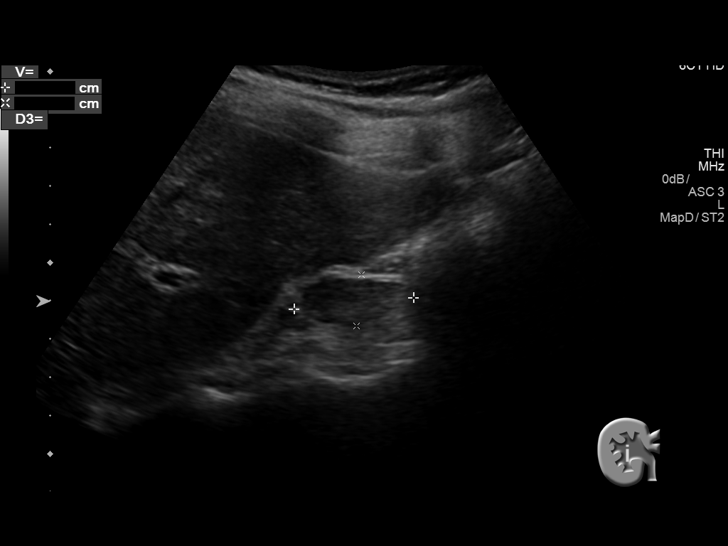
[im 8/45]
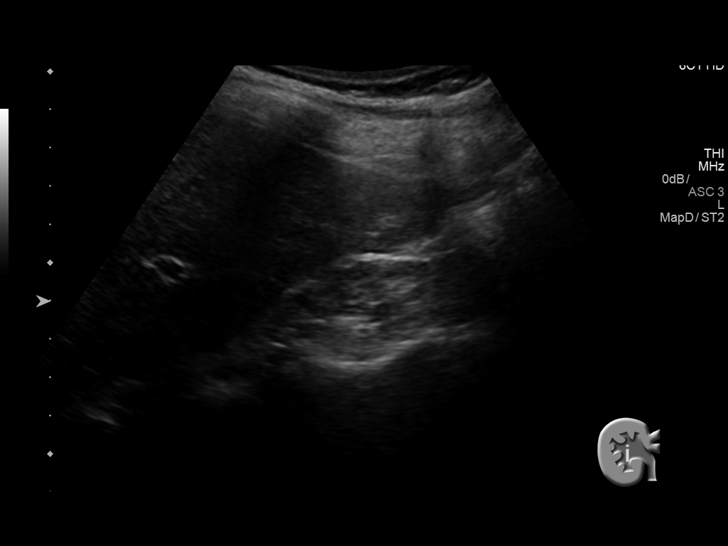
[im 12/45]
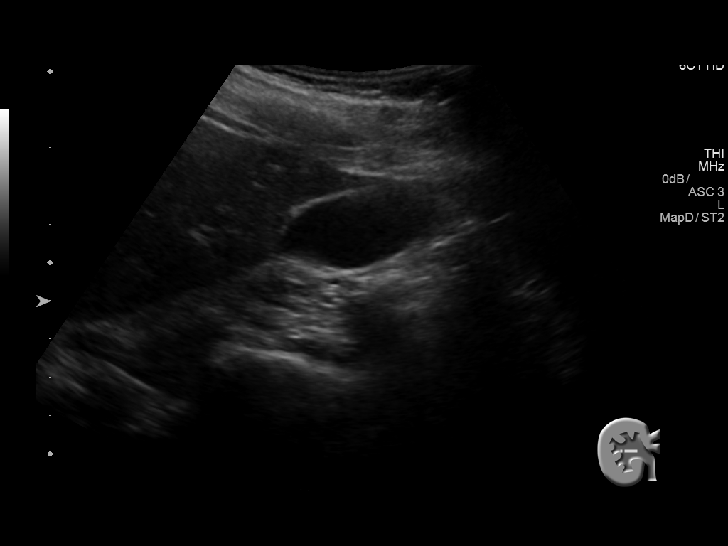
[im 15/45]
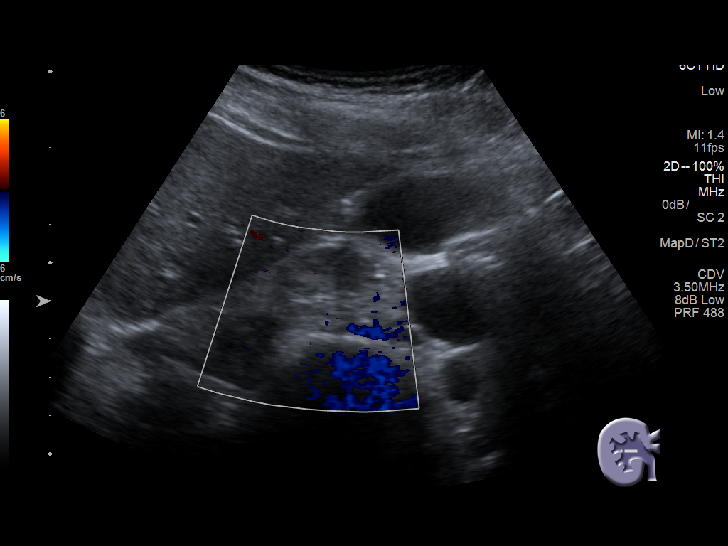
[im 17/45]
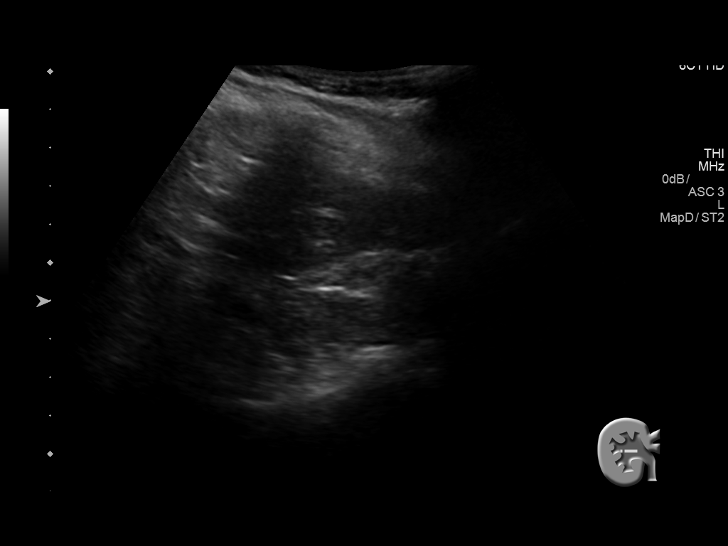
[im 21/45]
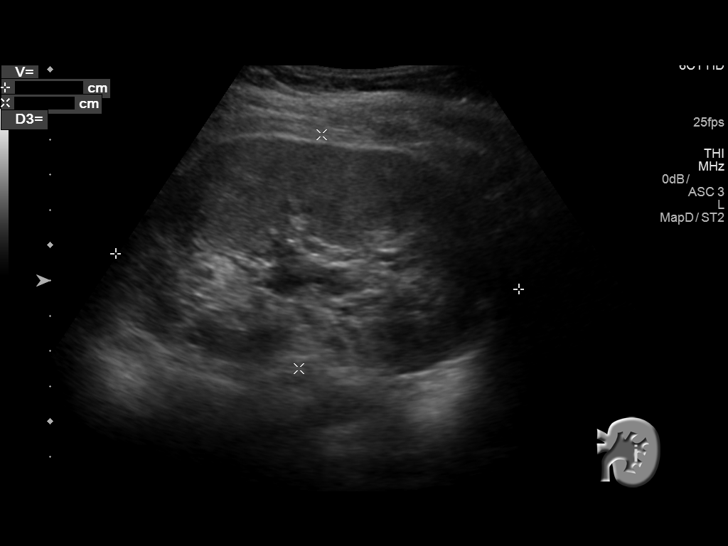
[im 24/45]
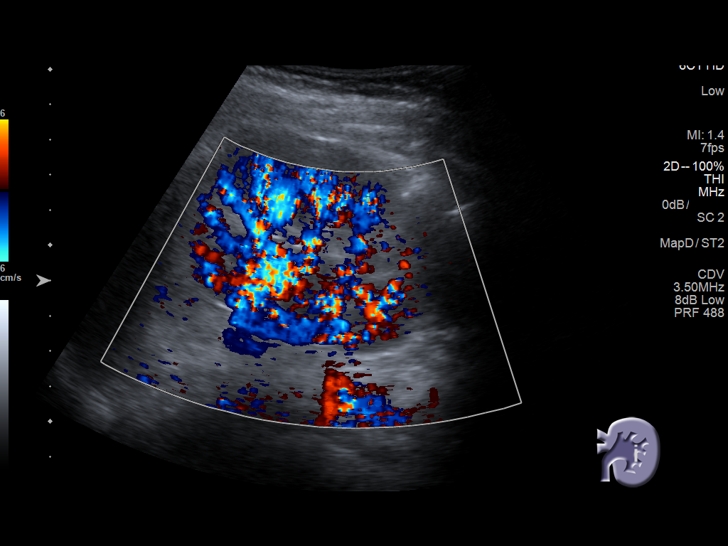
[im 28/45]
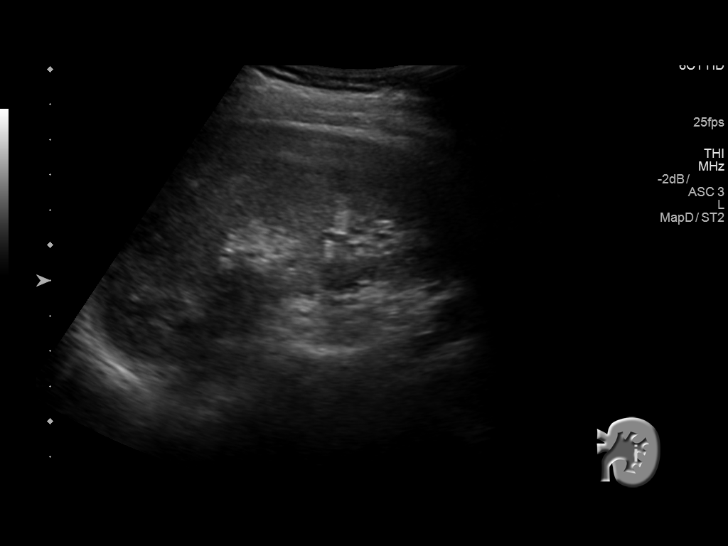
[im 30/45]
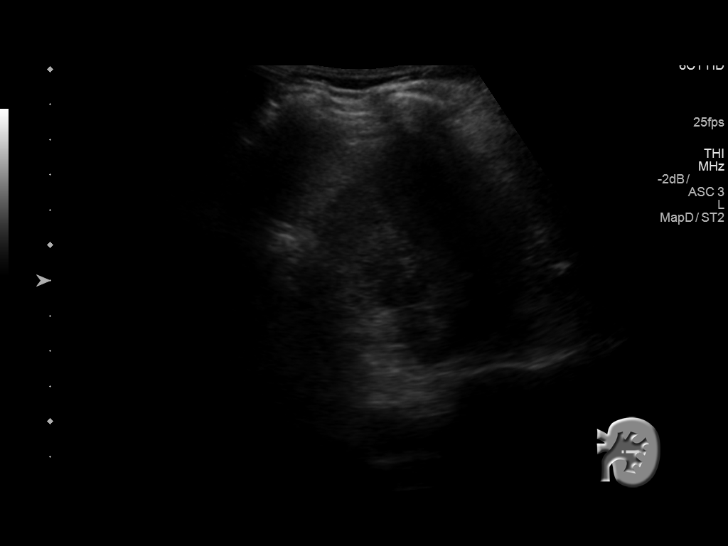
[im 34/45]
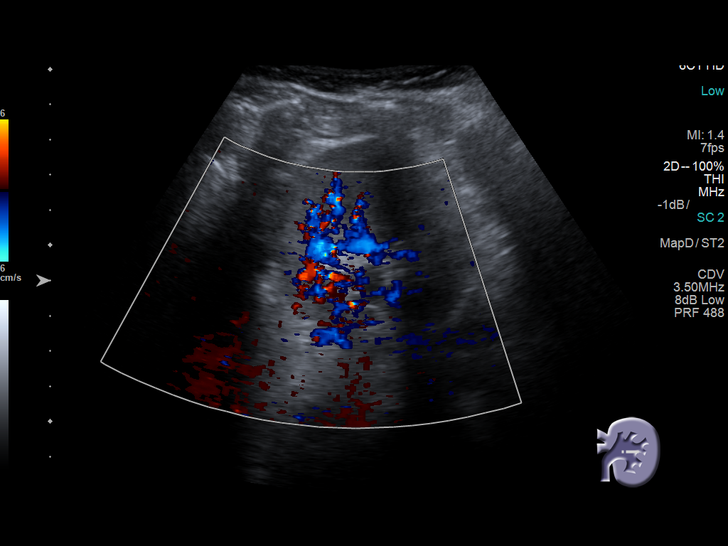
[im 37/45]
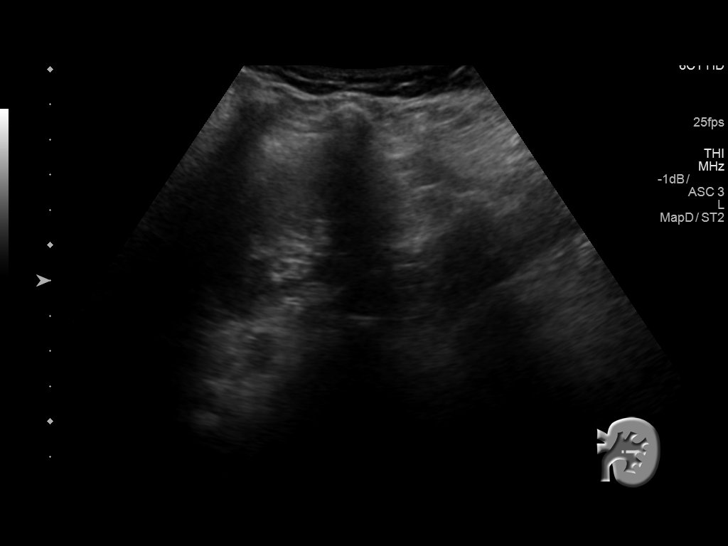
[im 41/45]
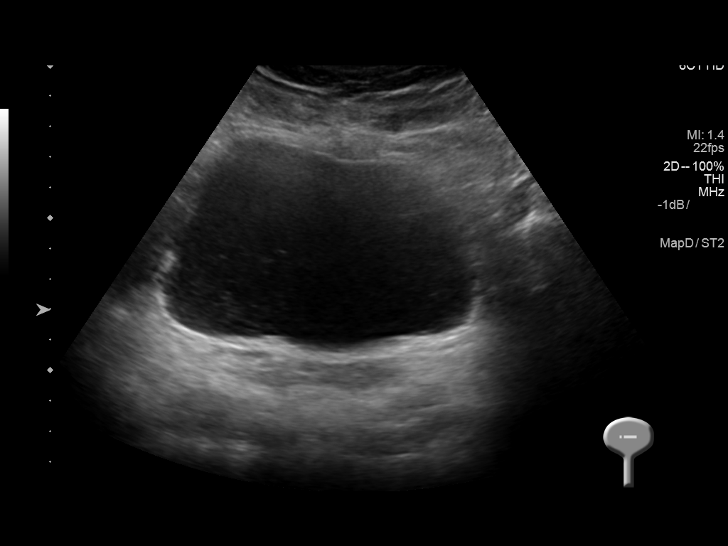
[im 45/45]
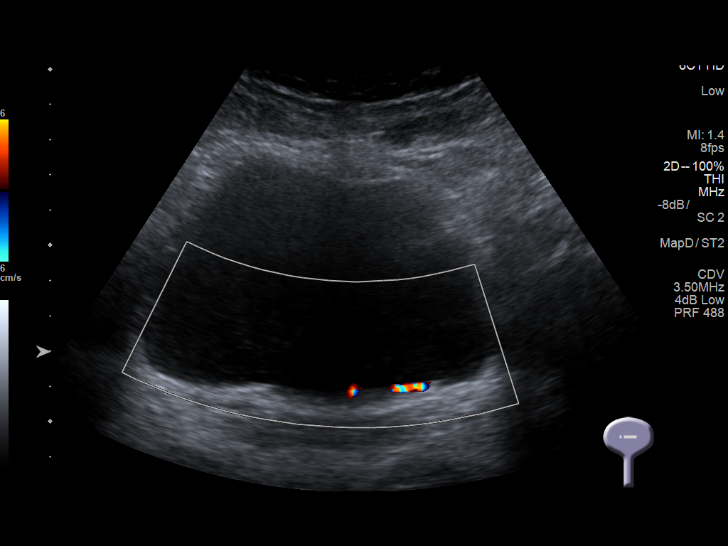

[14 of 25 positions shown; findings below may reference images not displayed]

FINDINGS: Right Kidney:

Renal measurements: 3.1 x 1.3 x 2.2 cm = volume: 4.9 mL. Markedly
hypoplastic renal remnant. Marked cortical thinning and increased
cortical echogenicity. No obvious mass or hydronephrosis.

Left Kidney:

Renal measurements: 11.5 x 6.7 x 6.3 cm = volume: 253.8 mL. Cortical
thickening compatible with compensatory hypertrophy. Upper normal
cortical echogenicity. No definite renal mass or hydronephrosis. No
shadowing calcifications.

Bladder:

Appears normal for degree of bladder distention.
IMPRESSION: Markedly atrophic RIGHT kidney.

Compensatory hypertrophy of LEFT kidney without renal mass or
hydronephrosis.

## 2019-08-08 MED ORDER — NITROFURANTOIN MONOHYD MACRO 100 MG PO CAPS: 100.0000 mg | ORAL_CAPSULE | Freq: Two times a day (BID) | ORAL | 0 refills | Status: DC

## 2019-08-09 ENCOUNTER — Telehealth: Payer: Self-pay | Admitting: *Deleted

## 2019-08-09 NOTE — Telephone Encounter (Signed)
Telephoned patient at home number and left message.  

## 2019-08-09 NOTE — Telephone Encounter (Signed)
Patient returned call. Advised patient urine culture did show an UTI. Medication was called into pharmacy. For now will need to stop Kelfex while taking Bactrim. When finishes Bactrim will need to restart Keflex. Patient voiced understanding.

## 2019-08-22 ENCOUNTER — Encounter: Payer: No Typology Code available for payment source | Admitting: Women's Health

## 2019-08-24 ENCOUNTER — Encounter: Payer: Self-pay | Admitting: *Deleted

## 2019-09-07 ENCOUNTER — Encounter: Payer: Self-pay | Admitting: Advanced Practice Midwife

## 2019-09-07 ENCOUNTER — Other Ambulatory Visit: Payer: Self-pay

## 2019-09-07 ENCOUNTER — Ambulatory Visit (INDEPENDENT_AMBULATORY_CARE_PROVIDER_SITE_OTHER): Payer: No Typology Code available for payment source | Admitting: Advanced Practice Midwife

## 2019-09-07 VITALS — BP 125/79 | HR 107 | Wt 175.0 lb

## 2019-09-07 DIAGNOSIS — Z331 Pregnant state, incidental: Secondary | ICD-10-CM

## 2019-09-07 DIAGNOSIS — O0990 Supervision of high risk pregnancy, unspecified, unspecified trimester: Secondary | ICD-10-CM

## 2019-09-07 DIAGNOSIS — Z1389 Encounter for screening for other disorder: Secondary | ICD-10-CM

## 2019-09-07 DIAGNOSIS — R8271 Bacteriuria: Secondary | ICD-10-CM

## 2019-09-07 DIAGNOSIS — Z3A18 18 weeks gestation of pregnancy: Secondary | ICD-10-CM

## 2019-09-07 DIAGNOSIS — O99891 Other specified diseases and conditions complicating pregnancy: Secondary | ICD-10-CM

## 2019-09-07 DIAGNOSIS — Z7189 Other specified counseling: Secondary | ICD-10-CM | POA: Diagnosis not present

## 2019-09-07 LAB — POCT URINALYSIS DIPSTICK OB
Glucose, UA: NEGATIVE
Ketones, UA: NEGATIVE
Leukocytes, UA: NEGATIVE
Nitrite, UA: POSITIVE

## 2019-09-07 NOTE — Patient Instructions (Signed)
Charlotte Smith, I greatly value your feedback.  If you receive a survey following your visit with [place] today, we appreciate you taking the time to fill it out.  Thanks, Philipp Deputy, CNM  [facility] ([address] [place], [place] [address]) Entrance C, located off of [address] valet parking  Go to [email or web address] to register for FREE online childbirth classes  [place] Pediatricians/Family Doctors:  [facility] [phone]            [facility] [phone]                 [facility] [phone] (usually not accepting new patients unless you have family there already, you are always welcome to call and ask)       [facility] Department [phone]       [facility] Pediatricians/Family Doctors:   [facility]: [phone]  [facility]: [phone]  [facility]: [phone]  [facility] Doctors:   [facility]: [phone]   [facility]: [phone]  [facility] Doctors:  [facility]: [phone]    Home Blood Pressure Monitoring for Patients   Your provider has recommended that you check your blood pressure (BP) at least once a week at home. If you do not have a blood pressure cuff at home, one will be provided for you. Contact your provider if you have not received your monitor within 1 week.   Helpful Tips for Accurate Home Blood Pressure Checks  . Don't smoke, exercise, or drink caffeine 30 minutes before checking your BP . Use the restroom before checking your BP (a full bladder can raise your pressure) . Relax in a comfortable upright chair . Feet on the ground . Left arm resting comfortably on a flat surface at the level of your heart . Legs uncrossed . Back supported . Sit quietly and don't talk . Place the cuff on your bare arm . Adjust snuggly, so that only  two fingertips can fit between your skin and the top of the cuff . Check 2 readings separated by at least one minute . Keep a log of your BP readings . For a visual, please reference this diagram: http://ccnc.care/bpdiagram  Provider Name: [facility]     Phone: [phone]  Zone 1: ALL CLEAR  Continue to monitor your symptoms:  . BP reading is less than 140 (top number) or less than 90 (bottom number)  . No right upper stomach pain . No headaches or seeing spots . No feeling nauseated or throwing up . No swelling in face and hands  Zone 2: CAUTION Call your doctor's office for any of the following:  . BP reading is greater than 140 (top number) or greater than 90 (bottom number)  . Stomach pain under your ribs in the middle or right side . Headaches or seeing spots . Feeling nauseated or throwing up . Swelling in face and hands  Zone 3: EMERGENCY  Seek immediate medical care if you have any of the following:  . BP reading is greater than160 (top number) or greater than 110 (bottom number) . Severe headaches not improving with Tylenol . Serious difficulty catching your breath . Any worsening symptoms from Zone 2     Second Trimester of Pregnancy The second trimester is from week 14 through week 27 (months 4 through 6). The second trimester is often a time when you feel your best. Your  body has adjusted to being pregnant, and you begin to feel better physically. Usually, morning sickness has lessened or quit completely, you may have more energy, and you may have an increase in appetite. The second trimester is also a time when the fetus is growing rapidly. At the end of the sixth month, the fetus is about 9 inches long and weighs about 1 pounds. You will likely begin to feel the baby move (quickening) between 16 and 20 weeks of pregnancy. Body changes during your second trimester Your body continues to go through many changes during your second trimester. The changes vary  from woman to woman.  Your weight will continue to increase. You will notice your lower abdomen bulging out.  You may begin to get stretch marks on your hips, abdomen, and breasts.  You may develop headaches that can be relieved by medicines. The medicines should be approved by your health care provider.  You may urinate more often because the fetus is pressing on your bladder.  You may develop or continue to have heartburn as a result of your pregnancy.  You may develop constipation because certain hormones are causing the muscles that push waste through your intestines to slow down.  You may develop hemorrhoids or swollen, bulging veins (varicose veins).  You may have back pain. This is caused by: ? Weight gain. ? Pregnancy hormones that are relaxing the joints in your pelvis. ? A shift in weight and the muscles that support your balance.  Your breasts will continue to grow and they will continue to become tender.  Your gums may bleed and may be sensitive to brushing and flossing.  Dark spots or blotches (chloasma, mask of pregnancy) may develop on your face. This will likely fade after the baby is born.  A dark line from your belly button to the pubic area (linea nigra) may appear. This will likely fade after the baby is born.  You may have changes in your hair. These can include thickening of your hair, rapid growth, and changes in texture. Some women also have hair loss during or after pregnancy, or hair that feels dry or thin. Your hair will most likely return to normal after your baby is born.  What to expect at prenatal visits During a routine prenatal visit:  You will be weighed to make sure you and the fetus are growing normally.  Your blood pressure will be taken.  Your abdomen will be measured to track your baby's growth.  The fetal heartbeat will be listened to.  Any test results from the previous visit will be discussed.  Your health care provider may ask  you:  How you are feeling.  If you are feeling the baby move.  If you have had any abnormal symptoms, such as leaking fluid, bleeding, severe headaches, or abdominal cramping.  If you are using any tobacco products, including cigarettes, chewing tobacco, and electronic cigarettes.  If you have any questions.  Other tests that may be performed during your second trimester include:  Blood tests that check for: ? Low iron levels (anemia). ? High blood sugar that affects pregnant women (gestational diabetes) between 78 and 28 weeks. ? Rh antibodies. This is to check for a protein on red blood cells (Rh factor).  Urine tests to check for infections, diabetes, or protein in the urine.  An ultrasound to confirm the proper growth and development of the baby.  An amniocentesis to check for possible genetic problems.  Fetal screens for  spina bifida and Down syndrome.  HIV (human immunodeficiency virus) testing. Routine prenatal testing includes screening for HIV, unless you choose not to have this test.  Follow these instructions at home: Medicines  Follow your health care provider's instructions regarding medicine use. Specific medicines may be either safe or unsafe to take during pregnancy.  Take a prenatal vitamin that contains at least 600 micrograms (mcg) of folic acid.  If you develop constipation, try taking a stool softener if your health care provider approves. Eating and drinking  Eat a balanced diet that includes fresh fruits and vegetables, whole grains, good sources of protein such as meat, eggs, or tofu, and low-fat dairy. Your health care provider will help you determine the amount of weight gain that is right for you.  Avoid raw meat and uncooked cheese. These carry germs that can cause birth defects in the baby.  If you have low calcium intake from food, talk to your health care provider about whether you should take a daily calcium supplement.  Limit foods that  are high in fat and processed sugars, such as fried and sweet foods.  To prevent constipation: ? Drink enough fluid to keep your urine clear or pale yellow. ? Eat foods that are high in fiber, such as fresh fruits and vegetables, whole grains, and beans. Activity  Exercise only as directed by your health care provider. Most women can continue their usual exercise routine during pregnancy. Try to exercise for 30 minutes at least 5 days a week. Stop exercising if you experience uterine contractions.  Avoid heavy lifting, wear low heel shoes, and practice good posture.  A sexual relationship may be continued unless your health care provider directs you otherwise. Relieving pain and discomfort  Wear a good support bra to prevent discomfort from breast tenderness.  Take warm sitz baths to soothe any pain or discomfort caused by hemorrhoids. Use hemorrhoid cream if your health care provider approves.  Rest with your legs elevated if you have leg cramps or low back pain.  If you develop varicose veins, wear support hose. Elevate your feet for 15 minutes, 3-4 times a day. Limit salt in your diet. Prenatal Care  Write down your questions. Take them to your prenatal visits.  Keep all your prenatal visits as told by your health care provider. This is important. Safety  Wear your seat belt at all times when driving.  Make a list of emergency phone numbers, including numbers for family, friends, the hospital, and police and fire departments. General instructions  Ask your health care provider for a referral to a local prenatal education class. Begin classes no later than the beginning of month 6 of your pregnancy.  Ask for help if you have counseling or nutritional needs during pregnancy. Your health care provider can offer advice or refer you to specialists for help with various needs.  Do not use hot tubs, steam rooms, or saunas.  Do not douche or use tampons or scented sanitary  pads.  Do not cross your legs for long periods of time.  Avoid cat litter boxes and soil used by cats. These carry germs that can cause birth defects in the baby and possibly loss of the fetus by miscarriage or stillbirth.  Avoid all smoking, herbs, alcohol, and unprescribed drugs. Chemicals in these products can affect the formation and growth of the baby.  Do not use any products that contain nicotine or tobacco, such as cigarettes and e-cigarettes. If you need help quitting, ask  your health care provider.  Visit your dentist if you have not gone yet during your pregnancy. Use a soft toothbrush to brush your teeth and be gentle when you floss. Contact a health care provider if:  You have dizziness.  You have mild pelvic cramps, pelvic pressure, or nagging pain in the abdominal area.  You have persistent nausea, vomiting, or diarrhea.  You have a bad smelling vaginal discharge.  You have pain when you urinate. Get help right away if:  You have a fever.  You are leaking fluid from your vagina.  You have spotting or bleeding from your vagina.  You have severe abdominal cramping or pain.  You have rapid weight gain or weight loss.  You have shortness of breath with chest pain.  You notice sudden or extreme swelling of your face, hands, ankles, feet, or legs.  You have not felt your baby move in over an hour.  You have severe headaches that do not go away when you take medicine.  You have vision changes. Summary  The second trimester is from week 14 through week 27 (months 4 through 6). It is also a time when the fetus is growing rapidly.  Your body goes through many changes during pregnancy. The changes vary from woman to woman.  Avoid all smoking, herbs, alcohol, and unprescribed drugs. These chemicals affect the formation and growth your baby.  Do not use any tobacco products, such as cigarettes, chewing tobacco, and e-cigarettes. If you need help quitting, ask your  health care provider.  Contact your health care provider if you have any questions. Keep all prenatal visits as told by your health care provider. This is important. This information is not intended to replace advice given to you by your health care provider. Make sure you discuss any questions you have with your health care provider. Document Released: 12/31/2000 Document Revised: 06/14/2015 Document Reviewed: 03/09/2012 Elsevier Interactive Patient Education  2017 [employer] Inc.  PROTECT YOURSELF & YOUR BABY FROM THE FLU! Because you are pregnant, we at [facility], along with the Centers for Disease Control (CDC), recommend that you receive the flu vaccine to protect yourself and your baby from the flu. The flu is more likely to cause severe illness in pregnant women than in women of reproductive age who are not pregnant. Changes in the immune system, heart, and lungs during pregnancy make pregnant women (and women up to two weeks postpartum) more prone to severe illness from flu, including illness resulting in hospitalization. Flu also may be harmful for a pregnant woman's developing baby. A common flu symptom is fever, which may be associated with neural tube defects and other adverse outcomes for a developing baby. Getting vaccinated can also help protect a baby after birth from flu. (Mom passes antibodies onto the developing baby during her pregnancy.)  A Flu Vaccine is the Best Protection Against Flu Getting a flu vaccine is the first and most important step in protecting against flu. Pregnant women should get a flu shot and not the live attenuated influenza vaccine (LAIV), also known as nasal spray flu vaccine. Flu vaccines given during pregnancy help protect both the mother and her baby from flu. Vaccination has been shown to reduce the risk of flu-associated acute respiratory infection in pregnant women by up to one-half. A 2018 study showed that getting a flu shot reduced a pregnant woman's  risk of being hospitalized with flu by an average of 40 percent. Pregnant women who get a flu vaccine  are also helping to protect their babies from flu illness for the first several months after their birth, when they are too young to get vaccinated.   A Long Record of Safety for Flu Shots in Pregnant Women Flu shots have been given to millions of pregnant women over many years with a good safety record. There is a lot of evidence that flu vaccines can be given safely during pregnancy; though these data are limited for the first trimester. The CDC recommends that pregnant women get vaccinated during any trimester of their pregnancy. It is very important for pregnant women to get the flu shot.   Other Preventive Actions In addition to getting a flu shot, pregnant women should take the same everyday preventive actions the CDC recommends of everyone, including covering coughs, washing hands often, and avoiding people who are sick.  Symptoms and Treatment If you get sick with flu symptoms call your doctor right away. There are antiviral drugs that can treat flu illness and prevent serious flu complications. The CDC recommends prompt treatment for people who have influenza infection or suspected influenza infection and who are at high risk of serious flu complications, such as people with asthma, diabetes (including gestational diabetes), or heart disease. Early treatment of influenza in hospitalized pregnant women has been shown to reduce the length of the hospital stay.  Symptoms Flu symptoms include fever, cough, sore throat, runny or stuffy nose, body aches, headache, chills and fatigue. Some people may also have vomiting and diarrhea. People may be infected with the flu and have respiratory symptoms without a fever.  Early Treatment is Important for Pregnant Women Treatment should begin as soon as possible because antiviral drugs work best when started early (within 48 hours after symptoms  start). Antiviral drugs can make your flu illness milder and make you feel better faster. They may also prevent serious health problems that can result from flu illness. Oral oseltamivir (Tamiflu) is the preferred treatment for pregnant women because it has the most studies available to suggest that it is safe and beneficial. Antiviral drugs require a prescription from your provider. Having a fever caused by flu infection or other infections early in pregnancy may be linked to birth defects in a baby. In addition to taking antiviral drugs, pregnant women who get a fever should treat their fever with Tylenol (acetaminophen) and contact their provider immediately.  When to [place] If you are pregnant and have any of these signs, seek care immediately:  Difficulty breathing or shortness of breath  Pain or pressure in the chest or abdomen  Sudden dizziness  Confusion  Severe or persistent vomiting  High fever that is not responding to Tylenol (or store brand equivalent)  Decreased or no movement of your baby  [email or web address]

## 2019-09-07 NOTE — Progress Notes (Signed)
HIGH-RISK PREGNANCY VISIT Patient name: Charlotte Smith MRN [id number]  Date of birth: September 05, 1995 Chief Complaint:   Routine Prenatal Visit  History of Present Illness:   Charlotte Smith is a 24 y.o. G2P1001 female at [redacted]w[redacted]d with an Estimated Date of Delivery: 02/07/20 being seen today for ongoing management of a high-risk pregnancy complicated by chronic renal disease.  Today she reports no complaints. She is requesting Covid-19 vacc exemption (see counseling note below).  Depression screen [facility] 2/9 08/01/2019 08/01/2019  Decreased Interest 0 0  Down, Depressed, Hopeless 0 0  PHQ - 2 Score 0 0  Altered sleeping 0 0  Tired, decreased energy 2 2  Change in appetite 2 2  Feeling bad or failure about yourself  0 0  Trouble concentrating 0 0  Moving slowly or fidgety/restless 0 0  Suicidal thoughts 0 0  PHQ-9 Score 4 4    Contractions: Not present. Vag. Bleeding: None.  Movement: Present. denies leaking of fluid.  Review of Systems:   Pertinent items are noted in HPI Denies abnormal vaginal discharge w/ itching/odor/irritation, headaches, visual changes, shortness of breath, chest pain, abdominal pain, severe nausea/vomiting, or problems with urination or bowel movements unless otherwise stated above. Pertinent History Reviewed:  Reviewed past medical,surgical, social, obstetrical and family history.  Reviewed problem list, medications and allergies. Physical Assessment:   Vitals:   09/07/19 1554  BP: 125/79  Pulse: (!) 107  Weight: 175 lb (79.4 kg)  Body mass index is 28.25 kg/m.           Physical Examination:   General appearance: alert, well appearing, and in no distress  Mental status: alert, oriented to person, place, and time  Skin: warm & dry   Extremities: Edema: None    Cardiovascular: normal heart rate noted  Respiratory: normal respiratory effort, no distress  Abdomen: gravid, soft, non-tender  Pelvic: Cervical exam deferred         Fetal Status: Fetal Heart  Rate (bpm): 170   Movement: Present    Results for orders placed or performed in visit on 09/07/19 (from the past 24 hour(s))  POC Urinalysis Dipstick OB   Collection Time: 09/07/19  3:56 PM  Result Value Ref Range   Color, UA     Clarity, UA     Glucose, UA Negative Negative   Bilirubin, UA     Ketones, UA neg    Spec Grav, UA     Blood, UA moderate    pH, UA     POC,PROTEIN,UA Trace Negative, Trace, Small (1+), Moderate (2+), Large (3+), 4+   Urobilinogen, UA     Nitrite, UA positive    Leukocytes, UA Negative Negative   Appearance     Odor      Assessment & Plan:  1) High-risk pregnancy G2P1001 at [redacted]w[redacted]d with an Estimated Date of Delivery: 02/07/20   2) CKD, freq UTIs, on Keflex q hs suppression, asymptomatic with +nitrites> to culture  3) Prev C/S for breech, wants rLTCS & BTL   Meds: No orders of the defined types were placed in this encounter.   Labs/procedures today: urine culture  Treatment Plan: get anatomy u/s ASAP (missed 15wk appt so it wasn't scheduled); plan EFW q 4wks, 2x/wk testing @ 32, del @ 37  The patient was counseled on the potential benefits and lack of known risks of COVID vaccination, during pregnancy and breastfeeding, on today's visit. The patient's questions and concerns were addressed today, including concern of long term effects on  pt/baby. The patient is not planning to get vaccinated at this time. The patient is aware that if she chooses not to get an employee mandated vaccination we will provide documentation for her employers in the form of a letter unless a specific exemption form is submitted to the provider. The patient is aware that this documentation is a deferment of a mandated vaccination that will need to be renewed by a provider.   Reviewed: Preterm labor symptoms and general obstetric precautions including but not limited to vaginal bleeding, contractions, leaking of fluid and fetal movement were reviewed in detail with the patient.  All  questions were answered. Has home bp cuff. Check bp weekly, let us know if >140/90.   Follow-up: Return for [place]: Anatomy- first avail; then HROB in 4wks.  Orders Placed This Encounter  Procedures  . Urine Culture  . US OB Comp + 14 Wk  . INTEGRATED 2  . POC Urinalysis Dipstick OB   Arabella Merles [facility] 09/07/2019 4:22 PM

## 2019-09-09 ENCOUNTER — Other Ambulatory Visit: Payer: Self-pay | Admitting: Advanced Practice Midwife

## 2019-09-09 LAB — INTEGRATED 2
AFP MoM: 0.9
Alpha-Fetoprotein: 38.2 ng/mL
Crown Rump Length: 73 mm
DIA MoM: 0.84
DIA Value: 126.7 pg/mL
Estriol, Unconjugated: 1.91 ng/mL
Gest. Age on Collection Date: 13.3 weeks
Gestational Age: 18.6 weeks
Maternal Age at EDD: 27 yr
Nuchal Translucency (NT): 1.7 mm
Nuchal Translucency MoM: 1.02
Number of Fetuses: 1
PAPP-A MoM: 0.35
PAPP-A Value: 406.4 ng/mL
Test Results:: NEGATIVE
Weight: 167 [lb_av]
Weight: 167 [lb_av]
hCG MoM: 1.27
hCG Value: 28.2 IU/mL
uE3 MoM: 1.22

## 2019-09-09 MED ORDER — NITROFURANTOIN MACROCRYSTAL 100 MG PO CAPS: 100.0000 mg | ORAL_CAPSULE | Freq: Every evening | ORAL | 4 refills | Status: DC

## 2019-09-09 MED ORDER — NITROFURANTOIN MONOHYD MACRO 100 MG PO CAPS: 100.0000 mg | ORAL_CAPSULE | Freq: Two times a day (BID) | ORAL | 0 refills | Status: DC

## 2019-09-09 NOTE — Progress Notes (Unsigned)
mac

## 2019-09-12 ENCOUNTER — Other Ambulatory Visit: Payer: Self-pay | Admitting: Women's Health

## 2019-09-12 LAB — URINE CULTURE

## 2019-09-12 MED ORDER — ONDANSETRON 4 MG PO TBDP: 4.0000 mg | ORAL_TABLET | Freq: Three times a day (TID) | ORAL | 3 refills | Status: DC | PRN

## 2019-09-21 ENCOUNTER — Other Ambulatory Visit: Payer: Self-pay | Admitting: Advanced Practice Midwife

## 2019-09-21 DIAGNOSIS — O0990 Supervision of high risk pregnancy, unspecified, unspecified trimester: Secondary | ICD-10-CM

## 2019-09-21 DIAGNOSIS — Z363 Encounter for antenatal screening for malformations: Secondary | ICD-10-CM

## 2019-09-22 ENCOUNTER — Ambulatory Visit (INDEPENDENT_AMBULATORY_CARE_PROVIDER_SITE_OTHER): Payer: No Typology Code available for payment source

## 2019-09-22 DIAGNOSIS — Z363 Encounter for antenatal screening for malformations: Secondary | ICD-10-CM

## 2019-09-22 DIAGNOSIS — O0990 Supervision of high risk pregnancy, unspecified, unspecified trimester: Secondary | ICD-10-CM | POA: Diagnosis not present

## 2019-09-22 NOTE — Progress Notes (Signed)
[place] 20+2 wks,cephalic,cx 3.7 cm,posterior placenta gr 0,SVP 4.8 cm,fhr 154 bpm,EFW 387 g 79%,anatomy complete,no obvious abnormalities

## 2019-10-05 ENCOUNTER — Encounter: Payer: Self-pay | Admitting: Women's Health

## 2019-10-05 ENCOUNTER — Ambulatory Visit (INDEPENDENT_AMBULATORY_CARE_PROVIDER_SITE_OTHER): Payer: No Typology Code available for payment source | Admitting: Women's Health

## 2019-10-05 ENCOUNTER — Other Ambulatory Visit: Payer: Self-pay

## 2019-10-05 VITALS — BP 114/74 | HR 103 | Wt 177.6 lb

## 2019-10-05 DIAGNOSIS — O2342 Unspecified infection of urinary tract in pregnancy, second trimester: Secondary | ICD-10-CM

## 2019-10-05 DIAGNOSIS — N189 Chronic kidney disease, unspecified: Secondary | ICD-10-CM

## 2019-10-05 DIAGNOSIS — Z1389 Encounter for screening for other disorder: Secondary | ICD-10-CM | POA: Diagnosis not present

## 2019-10-05 DIAGNOSIS — Z331 Pregnant state, incidental: Secondary | ICD-10-CM

## 2019-10-05 DIAGNOSIS — Z3A22 22 weeks gestation of pregnancy: Secondary | ICD-10-CM | POA: Diagnosis not present

## 2019-10-05 DIAGNOSIS — O0990 Supervision of high risk pregnancy, unspecified, unspecified trimester: Secondary | ICD-10-CM

## 2019-10-05 DIAGNOSIS — O0992 Supervision of high risk pregnancy, unspecified, second trimester: Secondary | ICD-10-CM

## 2019-10-05 DIAGNOSIS — Z98891 History of uterine scar from previous surgery: Secondary | ICD-10-CM

## 2019-10-05 LAB — POCT URINALYSIS DIPSTICK OB
Glucose, UA: NEGATIVE
Ketones, UA: NEGATIVE
Nitrite, UA: POSITIVE

## 2019-10-05 MED ORDER — CEPHALEXIN 500 MG PO CAPS: 500.0000 mg | ORAL_CAPSULE | Freq: Every day | ORAL | 3 refills | Status: DC

## 2019-10-05 MED ORDER — SULFAMETHOXAZOLE-TRIMETHOPRIM 800-160 MG PO TABS: 1.0000 | ORAL_TABLET | Freq: Two times a day (BID) | ORAL | 0 refills | Status: DC

## 2019-10-05 NOTE — Patient Instructions (Signed)
Charlotte Smith, I greatly value your feedback.  If you receive a survey following your visit with [place] today, we appreciate you taking the time to fill it out.  Thanks, [name], CNM, WHNP-BC   You will have your sugar test next visit.  Please do not eat or drink anything after midnight the night before you come, not even water.  You will be here for at least two hours.  Please make an appointment online for the bloodwork at [email or web address] for 8:30am (or as close to this as possible). Make sure you select the [facility] service center. The day of the appointment, check in with our office first, then you will go to [facility] to start the sugar test.    [facility] [place], [place] [address]) Entrance C, located off of [address] valet parking  Go to [email or web address] to register for FREE online childbirth classes   Call the office [phone]) or go to [facility] if:  You begin to have strong, frequent contractions  Your water breaks.  Sometimes it is a big gush of fluid, sometimes it is just a trickle that keeps getting your panties wet or running down your legs  You have vaginal bleeding.  It is normal to have a small amount of spotting if your cervix was checked.   You don't feel your baby moving like normal.  If you don't, get you something to eat and drink and lay down and focus on feeling your baby move.   If your baby is still not moving like normal, you should call the office or go to [facility].  [place] Pediatricians/Family Doctors:  [facility] [phone]            [facility] [phone]                 [facility] [phone] (usually not accepting new patients unless you have family there already, you are always welcome to call and ask)       [facility] Department [phone]       [facility] Pediatricians/Family Doctors:   [facility]:  [phone]  [facility]: [phone]  [facility]: [phone]  [facility] Doctors:   [facility]: [phone]   [facility]: [phone]  [facility] Doctors:  [facility]: [phone]   Home Blood Pressure Monitoring for Patients   Your provider has recommended that you check your blood pressure (BP) at least once a week at home. If you do not have a blood pressure cuff at home, one will be provided for you. Contact your provider if you have not received your monitor within 1 week.   Helpful Tips for Accurate Home Blood Pressure Checks  . Don't smoke, exercise, or drink caffeine 30 minutes before checking your BP . Use the restroom before checking your BP (a full bladder can raise your pressure) . Relax in a comfortable upright chair . Feet on the ground . Left arm resting comfortably on a flat surface at the level of your heart . Legs uncrossed . Back supported . Sit quietly and don't talk . Place the cuff on your bare arm . Adjust snuggly, so that only two fingertips can fit between your skin and the top of the cuff . Check 2 readings separated by at least one minute . Keep a log of your BP readings . For a visual, please reference  this diagram: http://ccnc.care/bpdiagram  Provider Name: [facility]     Phone: [phone]  Zone 1: ALL CLEAR  Continue to monitor your symptoms:  . BP reading is less than 140 (top number) or less than 90 (bottom number)  . No right upper stomach pain . No headaches or seeing spots . No feeling nauseated or throwing up . No swelling in face and hands  Zone 2: CAUTION Call your doctor's office for any of the following:  . BP reading is greater than 140 (top number) or greater than 90 (bottom number)  . Stomach pain under your ribs in the middle or right side . Headaches or seeing spots . Feeling nauseated or throwing up . Swelling in  face and hands  Zone 3: EMERGENCY  Seek immediate medical care if you have any of the following:  . BP reading is greater than160 (top number) or greater than 110 (bottom number) . Severe headaches not improving with Tylenol . Serious difficulty catching your breath . Any worsening symptoms from Zone 2   Second Trimester of Pregnancy The second trimester is from week 13 through week 28, months 4 through 6. The second trimester is often a time when you feel your best. Your body has also adjusted to being pregnant, and you begin to feel better physically. Usually, morning sickness has lessened or quit completely, you may have more energy, and you may have an increase in appetite. The second trimester is also a time when the fetus is growing rapidly. At the end of the sixth month, the fetus is about 9 inches long and weighs about 1 pounds. You will likely begin to feel the baby move (quickening) between 18 and 20 weeks of the pregnancy. BODY CHANGES Your body goes through many changes during pregnancy. The changes vary from woman to woman.   Your weight will continue to increase. You will notice your lower abdomen bulging out.  You may begin to get stretch marks on your hips, abdomen, and breasts.  You may develop headaches that can be relieved by medicines approved by your health care provider.  You may urinate more often because the fetus is pressing on your bladder.  You may develop or continue to have heartburn as a result of your pregnancy.  You may develop constipation because certain hormones are causing the muscles that push waste through your intestines to slow down.  You may develop hemorrhoids or swollen, bulging veins (varicose veins).  You may have back pain because of the weight gain and pregnancy hormones relaxing your joints between the bones in your pelvis and as a result of a shift in weight and the muscles that support your balance.  Your breasts will continue to grow  and be tender.  Your gums may bleed and may be sensitive to brushing and flossing.  Dark spots or blotches (chloasma, mask of pregnancy) may develop on your face. This will likely fade after the baby is born.  A dark line from your belly button to the pubic area (linea nigra) may appear. This will likely fade after the baby is born.  You may have changes in your hair. These can include thickening of your hair, rapid growth, and changes in texture. Some women also have hair loss during or after pregnancy, or hair that feels dry or thin. Your hair will most likely return to normal after your baby is born. WHAT TO EXPECT AT YOUR PRENATAL VISITS During a routine prenatal visit:  You will  be weighed to make sure you and the fetus are growing normally.  Your blood pressure will be taken.  Your abdomen will be measured to track your baby's growth.  The fetal heartbeat will be listened to.  Any test results from the previous visit will be discussed. Your health care provider may ask you:  How you are feeling.  If you are feeling the baby move.  If you have had any abnormal symptoms, such as leaking fluid, bleeding, severe headaches, or abdominal cramping.  If you have any questions. Other tests that may be performed during your second trimester include:  Blood tests that check for:  Low iron levels (anemia).  Gestational diabetes (between 24 and 28 weeks).  Rh antibodies.  Urine tests to check for infections, diabetes, or protein in the urine.  An ultrasound to confirm the proper growth and development of the baby.  An amniocentesis to check for possible genetic problems.  Fetal screens for spina bifida and Down syndrome. HOME CARE INSTRUCTIONS   Avoid all smoking, herbs, alcohol, and unprescribed drugs. These chemicals affect the formation and growth of the baby.  Follow your health care provider's instructions regarding medicine use. There are medicines that are either  safe or unsafe to take during pregnancy.  Exercise only as directed by your health care provider. Experiencing uterine cramps is a good sign to stop exercising.  Continue to eat regular, healthy meals.  Wear a good support bra for breast tenderness.  Do not use hot tubs, steam rooms, or saunas.  Wear your seat belt at all times when driving.  Avoid raw meat, uncooked cheese, cat litter boxes, and soil used by cats. These carry germs that can cause birth defects in the baby.  Take your prenatal vitamins.  Try taking a stool softener (if your health care provider approves) if you develop constipation. Eat more high-fiber foods, such as fresh vegetables or fruit and whole grains. Drink plenty of fluids to keep your urine clear or pale yellow.  Take warm sitz baths to soothe any pain or discomfort caused by hemorrhoids. Use hemorrhoid cream if your health care provider approves.  If you develop varicose veins, wear support hose. Elevate your feet for 15 minutes, 3-4 times a day. Limit salt in your diet.  Avoid heavy lifting, wear low heel shoes, and practice good posture.  Rest with your legs elevated if you have leg cramps or low back pain.  Visit your dentist if you have not gone yet during your pregnancy. Use a soft toothbrush to brush your teeth and be gentle when you floss.  A sexual relationship may be continued unless your health care provider directs you otherwise.  Continue to go to all your prenatal visits as directed by your health care provider. SEEK MEDICAL CARE IF:   You have dizziness.  You have mild pelvic cramps, pelvic pressure, or nagging pain in the abdominal area.  You have persistent nausea, vomiting, or diarrhea.  You have a bad smelling vaginal discharge.  You have pain with urination. SEEK IMMEDIATE MEDICAL CARE IF:   You have a fever.  You are leaking fluid from your vagina.  You have spotting or bleeding from your vagina.  You have severe  abdominal cramping or pain.  You have rapid weight gain or loss.  You have shortness of breath with chest pain.  You notice sudden or extreme swelling of your face, hands, ankles, feet, or legs.  You have not felt your baby move in  over an hour.  You have severe headaches that do not go away with medicine.  You have vision changes. Document Released: 12/31/2000 Document Revised: 01/11/2013 Document Reviewed: 03/09/2012 [facility] Patient Information 2015 [place], [place]. This information is not intended to replace advice given to you by your health care provider. Make sure you discuss any questions you have with your health care provider.

## 2019-10-05 NOTE — Progress Notes (Signed)
HIGH-RISK PREGNANCY VISIT Patient name: Charlotte Smith MRN [id number]  Date of birth: 1995-01-28 Chief Complaint:   High Risk Gestation (odor with urine)  History of Present Illness:   Charlotte Smith is a 24 y.o. G2P1001 female at [redacted]w[redacted]d with an Estimated Date of Delivery: 02/07/20 being seen today for ongoing management of a high-risk pregnancy complicated by chronic renal disease.  Today she reports strong odor to urine. Has had persistent E-coli urine cx this pregnancy despite tx w/ septra, then macrobid, then macrobid w/ nightly macrobid suppression.  Last went to [facility] w/ Dr. Wolfgang Phoenix in July 2020, supposed to go yearly. Has had intermittent body aches, chills, lower Rt back pain x ~3wks.  Depression screen [facility] 2/9 08/01/2019 08/01/2019  Decreased Interest 0 0  Down, Depressed, Hopeless 0 0  PHQ - 2 Score 0 0  Altered sleeping 0 0  Tired, decreased energy 2 2  Change in appetite 2 2  Feeling bad or failure about yourself  0 0  Trouble concentrating 0 0  Moving slowly or fidgety/restless 0 0  Suicidal thoughts 0 0  PHQ-9 Score 4 4    Contractions: Irregular. Vag. Bleeding: None.  Movement: Present. denies leaking of fluid.  Review of Systems:   Pertinent items are noted in HPI Denies abnormal vaginal discharge w/ itching/odor/irritation, headaches, visual changes, shortness of breath, chest pain, abdominal pain, severe nausea/vomiting, or problems with urination or bowel movements unless otherwise stated above. Pertinent History Reviewed:  Reviewed past medical,surgical, social, obstetrical and family history.  Reviewed problem list, medications and allergies. Physical Assessment:   Vitals:   10/05/19 1531  BP: 114/74  Pulse: (!) 103  Weight: 177 lb 9.6 oz (80.6 kg)  Body mass index is 28.67 kg/m.           Physical Examination:   General appearance: alert, well appearing, and in no distress  Mental status: alert, oriented to person, place, and time  Skin:  warm & dry   Extremities: Edema: None    Cardiovascular: normal heart rate noted  Respiratory: normal respiratory effort, no distress  Abdomen: gravid, soft, non-tender  Back: No CVAT  Pelvic: Cervical exam deferred         Fetal Status: Fetal Heart Rate (bpm): 145 Fundal Height: 22 cm Movement: Present    Fetal Surveillance Testing today: doppler   Chaperone: n/a    Results for orders placed or performed in visit on 10/05/19 (from the past 24 hour(s))  POC Urinalysis Dipstick OB   Collection Time: 10/05/19  3:32 PM  Result Value Ref Range   Color, UA     Clarity, UA     Glucose, UA Negative Negative   Bilirubin, UA     Ketones, UA neg    Spec Grav, UA     Blood, UA 3+    pH, UA     POC,PROTEIN,UA Moderate (2+) Negative, Trace, Small (1+), Moderate (2+), Large (3+), 4+   Urobilinogen, UA     Nitrite, UA positive    Leukocytes, UA Small (1+) (A) Negative   Appearance     Odor      Assessment & Plan:  1) High-risk pregnancy G2P1001 at [redacted]w[redacted]d with an Estimated Date of Delivery: 02/07/20   2) Chronic renal disease, Rt kidney completely deteriorated d/t reflux, chronic proteinuria, Cr 0.9 at new ob (had been 1.21 Jan 2018), persistent E-coli urine cultures this pregnancy despite septra, macrobid, macrobid suppression. Rx bactrim today, keflex qhs suppression. Send urine cx. Pt  to call to schedule yearly f/u w/ Dr.Bhutani  3) Prev c/s, for RCS w/ BTL  Meds:  Meds ordered this encounter  Medications  . sulfamethoxazole-trimethoprim (BACTRIM DS) 800-160 MG tablet    Sig: Take 1 tablet by mouth 2 (two) times daily. X 7 days    Dispense:  14 tablet    Refill:  0    Order Specific Question:   Supervising Provider    Answer:   Despina Hidden, LUTHER H [[id number]]  . cephALEXin (KEFLEX) 500 MG capsule    Sig: Take 1 capsule (500 mg total) by mouth at bedtime. After finished w/ bactrim    Dispense:  30 capsule    Refill:  3    Order Specific Question:   Supervising Provider    Answer:   Lazaro Arms [[id number]]    Labs/procedures today: urine cx  Treatment Plan:  U/S q4wks    2x/wk testing @ 32wks or weekly BPP    Deliver @ 37wks or as per MFM:____   Reviewed: Preterm labor symptoms and general obstetric precautions including but not limited to vaginal bleeding, contractions, leaking of fluid and fetal movement were reviewed in detail with the patient.  All questions were answered. Has home bp cuff. Check bp weekly, let us know if >140/90.   Follow-up: Return in about 4 weeks (around 11/02/2019) for HROB, PN2, in person, MD or CNM, US:EFW.  Orders Placed This Encounter  Procedures  . Urine Culture  . US OB Follow Up  . POC Urinalysis Dipstick OB   Cheral Marker CNM, [facility] 10/05/2019 4:51 PM

## 2019-10-07 ENCOUNTER — Inpatient Hospital Stay (HOSPITAL_COMMUNITY)
Admission: AD | Admit: 2019-10-07 | Discharge: 2019-10-09 | DRG: 833 | Discharge status: Against Medical Advice | Payer: No Typology Code available for payment source | Attending: Obstetrics and Gynecology | Admitting: Obstetrics and Gynecology

## 2019-10-07 ENCOUNTER — Other Ambulatory Visit: Payer: Self-pay

## 2019-10-07 ENCOUNTER — Encounter (HOSPITAL_COMMUNITY): Payer: Self-pay | Admitting: Obstetrics & Gynecology

## 2019-10-07 ENCOUNTER — Ambulatory Visit: Payer: Self-pay

## 2019-10-07 DIAGNOSIS — R109 Unspecified abdominal pain: Secondary | ICD-10-CM | POA: Diagnosis present

## 2019-10-07 DIAGNOSIS — O2302 Infections of kidney in pregnancy, second trimester: Secondary | ICD-10-CM | POA: Diagnosis not present

## 2019-10-07 DIAGNOSIS — B9620 Unspecified Escherichia coli [E. coli] as the cause of diseases classified elsewhere: Secondary | ICD-10-CM | POA: Diagnosis not present

## 2019-10-07 DIAGNOSIS — N12 Tubulo-interstitial nephritis, not specified as acute or chronic: Secondary | ICD-10-CM | POA: Diagnosis not present

## 2019-10-07 DIAGNOSIS — O2340 Unspecified infection of urinary tract in pregnancy, unspecified trimester: Secondary | ICD-10-CM | POA: Diagnosis present

## 2019-10-07 DIAGNOSIS — O2300 Infections of kidney in pregnancy, unspecified trimester: Secondary | ICD-10-CM | POA: Diagnosis present

## 2019-10-07 DIAGNOSIS — Z3A22 22 weeks gestation of pregnancy: Secondary | ICD-10-CM

## 2019-10-07 DIAGNOSIS — Z3A26 26 weeks gestation of pregnancy: Secondary | ICD-10-CM | POA: Diagnosis not present

## 2019-10-07 DIAGNOSIS — Z5329 Procedure and treatment not carried out because of patient's decision for other reasons: Secondary | ICD-10-CM | POA: Diagnosis present

## 2019-10-07 DIAGNOSIS — Z20822 Contact with and (suspected) exposure to covid-19: Secondary | ICD-10-CM | POA: Diagnosis present

## 2019-10-07 DIAGNOSIS — N10 Acute tubulo-interstitial nephritis: Secondary | ICD-10-CM | POA: Diagnosis not present

## 2019-10-07 LAB — TYPE AND SCREEN
ABO/RH(D): A POS
Antibody Screen: NEGATIVE

## 2019-10-07 LAB — SARS CORONAVIRUS 2 BY RT PCR (HOSPITAL ORDER, PERFORMED IN ~~LOC~~ HOSPITAL LAB): SARS Coronavirus 2: NEGATIVE

## 2019-10-07 LAB — URINALYSIS, ROUTINE W REFLEX MICROSCOPIC
Bilirubin Urine: NEGATIVE
Glucose, UA: NEGATIVE mg/dL
Ketones, ur: 5 mg/dL — AB
Leukocytes,Ua: NEGATIVE
Nitrite: NEGATIVE
Protein, ur: 100 mg/dL — AB
Specific Gravity, Urine: 1.006 (ref 1.005–1.030)
pH: 7 (ref 5.0–8.0)

## 2019-10-07 LAB — COMPREHENSIVE METABOLIC PANEL
ALT: 12 U/L (ref 0–44)
AST: 12 U/L — ABNORMAL LOW (ref 15–41)
Albumin: 2.8 g/dL — ABNORMAL LOW (ref 3.5–5.0)
Alkaline Phosphatase: 68 U/L (ref 38–126)
Anion gap: 11 (ref 5–15)
BUN: 8 mg/dL (ref 6–20)
CO2: 24 mmol/L (ref 22–32)
Calcium: 8.7 mg/dL — ABNORMAL LOW (ref 8.9–10.3)
Chloride: 98 mmol/L (ref 98–111)
Creatinine, Ser: 1.07 mg/dL — ABNORMAL HIGH (ref 0.44–1.00)
GFR calc Af Amer: >60 mL/min (ref >60)
GFR calc non Af Amer: >60 mL/min (ref >60)
Glucose, Bld: 98 mg/dL (ref 70–99)
Potassium: 4 mmol/L (ref 3.5–5.1)
Sodium: 133 mmol/L — ABNORMAL LOW (ref 135–145)
Total Bilirubin: 0.6 mg/dL (ref 0.3–1.2)
Total Protein: 6.3 g/dL — ABNORMAL LOW (ref 6.5–8.1)

## 2019-10-07 LAB — CBC
HCT: 28.7 % — ABNORMAL LOW (ref 36.0–46.0)
Hemoglobin: 9.4 g/dL — ABNORMAL LOW (ref 12.0–15.0)
MCH: 28.7 pg (ref 26.0–34.0)
MCHC: 32.8 g/dL (ref 30.0–36.0)
MCV: 87.5 fL (ref 80.0–100.0)
Platelets: 210 10*3/uL (ref 150–400)
RBC: 3.28 MIL/uL — ABNORMAL LOW (ref 3.87–5.11)
RDW: 13.6 % (ref 11.5–15.5)
WBC: 11 10*3/uL — ABNORMAL HIGH (ref 4.0–10.5)
nRBC: 0 % (ref 0.0–0.2)

## 2019-10-07 MED ORDER — SODIUM CHLORIDE 0.9 % IV SOLN: INTRAVENOUS | Status: DC

## 2019-10-07 MED ORDER — SODIUM CHLORIDE 0.9 % IV SOLN
2.0000 g | INTRAVENOUS | Status: DC
  Administered 2019-10-07 – 2019-10-09 (×3): 2 g via INTRAVENOUS
  Filled 2019-10-07 (×3): qty 20

## 2019-10-07 MED ORDER — PRENATAL MULTIVITAMIN CH
1.0000 | ORAL_TABLET | Freq: Every day | ORAL | Status: DC
  Administered 2019-10-08 – 2019-10-09 (×2): 1 via ORAL
  Filled 2019-10-07 (×2): qty 1

## 2019-10-07 MED ORDER — OXYCODONE HCL 5 MG PO TABS
5.0000 mg | ORAL_TABLET | Freq: Four times a day (QID) | ORAL | Status: DC | PRN
  Administered 2019-10-07: 5 mg via ORAL
  Administered 2019-10-08 (×2): 10 mg via ORAL
  Filled 2019-10-07 (×3): qty 2

## 2019-10-07 MED ORDER — ACETAMINOPHEN 325 MG PO TABS
650.0000 mg | ORAL_TABLET | ORAL | Status: DC | PRN
  Administered 2019-10-08 (×2): 650 mg via ORAL
  Filled 2019-10-07 (×2): qty 2

## 2019-10-07 MED ORDER — ZOLPIDEM TARTRATE 5 MG PO TABS: 5.0000 mg | ORAL_TABLET | Freq: Every evening | ORAL | Status: DC | PRN

## 2019-10-07 MED ORDER — LACTATED RINGERS IV SOLN: INTRAVENOUS | Status: DC

## 2019-10-07 MED ORDER — CALCIUM CARBONATE ANTACID 500 MG PO CHEW
2.0000 | CHEWABLE_TABLET | ORAL | Status: DC | PRN
  Administered 2019-10-08: 400 mg via ORAL
  Filled 2019-10-07: qty 2

## 2019-10-07 NOTE — H&P (Signed)
Faculty Practice Antenatal History and Physical  Ireland Virrueta ERD:[id number] DOB: [birth year] DOA: 10/07/2019  Chief Complaint: flank pain  HPI: Charlotte Smith is a 24 y.o. female G2P1001 with IUP at [redacted]w[redacted]d presenting for treatment for pyelonephritis after persistent UTI throughout this pregnancy. She developed pain 4 days ago and her antibiotic was changed 2 days ago. She reports temperature 100.7 last night.   Review of Systems:   Pt complains of recent fever and bilateral flank pain.  Pt denies any hematuria or dysuria. No nausea, SOB.  Review of systems are otherwise negative  Prenatal History/Complications: Non functioning right kidney  Past Medical History: Past Medical History:  Diagnosis Date  . Renal disorder    non-functioning left kidney  right  Past Surgical History: Past Surgical History:  Procedure Laterality Date  . BLADDER SURGERY    . CESAREAN SECTION N/A 10/30/2014   Procedure: CESAREAN SECTION;  Surgeon: Lazaro Arms, MD;  Location: [facility];  Service: Obstetrics;  Laterality: N/A;    Obstetrical History: OB History    Gravida  2   Para  1   Term  1   Preterm      AB  0   Living  1     SAB  0   TAB      Ectopic      Multiple  0   Live Births  1           Gynecological History: Pertinent Gynecological History: Menses: non-contributory  Social History: Social History   Socioeconomic History  . Marital status: Married    Spouse name: Not on file  . Number of children: 1  . Years of education: Not on file  . Highest education level: Not on file  Occupational History  . Not on file  Tobacco Use  . Smoking status: Never Smoker  . Smokeless tobacco: Never Used  Vaping Use  . Vaping Use: Never used  Substance and Sexual Activity  . Alcohol use: No  . Drug use: No  . Sexual activity: Yes    Birth control/protection: None  Other Topics Concern  . Not on file  Social History Narrative  . Not on file   Social  Determinants of Health   Financial Resource Strain: Low Risk   . Difficulty of Paying Living Expenses: Not hard at all  Food Insecurity: No Food Insecurity  . Worried About Programme researcher, broadcasting/film/video in the Last Year: Never true  . Ran Out of Food in the Last Year: Never true  Transportation Needs: No Transportation Needs  . Lack of Transportation (Medical): No  . Lack of Transportation (Non-Medical): No  Physical Activity: Insufficiently Active  . Days of Exercise per Week: 3 days  . Minutes of Exercise per Session: 30 min  Stress: No Stress Concern Present  . Feeling of Stress : Not at all  Social Connections: Moderately Integrated  . Frequency of Communication with Friends and Family: Twice a week  . Frequency of Social Gatherings with Friends and Family: Once a week  . Attends Religious Services: 1 to 4 times per year  . Active Member of Clubs or Organizations: No  . Attends Banker Meetings: Never  . Marital Status: Married    Family History: Family History  Problem Relation Age of Onset  . Depression Mother   . Hypertension Father   . Cancer Maternal Grandmother        lung  . Cancer Paternal Grandfather  testicular, lung    Allergies: No Known Allergies  Medications Prior to Admission  Medication Sig Dispense Refill Last Dose  . cephALEXin (KEFLEX) 500 MG capsule Take 1 capsule (500 mg total) by mouth at bedtime. After finished w/ bactrim 30 capsule 3   . nitrofurantoin (MACRODANTIN) 100 MG capsule Take 1 capsule (100 mg total) by mouth at bedtime. 30 capsule 4   . nitrofurantoin, macrocrystal-monohydrate, (MACROBID) 100 MG capsule Take 1 capsule (100 mg total) by mouth 2 (two) times daily. 14 capsule 0   . omeprazole (PRILOSEC) 10 MG capsule Take 1 bid 60 capsule 3   . ondansetron (ZOFRAN ODT) 4 MG disintegrating tablet Take 1 tablet (4 mg total) by mouth every 8 (eight) hours as needed for nausea or vomiting. 30 tablet 3   . Pediatric  Multivitamins-Iron (FLINTSTONES COMPLETE PO) Take 2 tablets by mouth.     . sulfamethoxazole-trimethoprim (BACTRIM DS) 800-160 MG tablet Take 1 tablet by mouth 2 (two) times daily. X 7 days 14 tablet 0     Physical Exam: BP 117/65 (BP Location: Right Arm)   Pulse (!) 117   Temp 99.7 F (37.6 C) (Oral)   Resp 20   Ht 5\' 6"  (1.676 m)   Wt 80.6 kg   LMP 05/03/2019 (Approximate)   SpO2 99%   BMI 28.70 kg/m   General appearance: alert, cooperative and no distress Lungs: normal effort Abdomen: gravid c/w 22 weeks Extremities: extremities normal, atraumatic, no cyanosis or edema Skin: Skin color, texture, turgor normal. No rashes or lesions unsure FHT detected None             Labs on Admission:  Basic Metabolic Panel: No results for input(s): NA, K, CL, CO2, GLUCOSE, BUN, CREATININE, CALCIUM, MG, PHOS in the last 168 hours. Liver Function Tests: No results for input(s): AST, ALT, ALKPHOS, BILITOT, PROT, ALBUMIN in the last 168 hours. No results for input(s): LIPASE, AMYLASE in the last 168 hours. No results for input(s): AMMONIA in the last 168 hours. CBC: No results for input(s): WBC, NEUTROABS, HGB, HCT, MCV, PLT in the last 168 hours.  CBG: No results for input(s): GLUCAP in the last 168 hours.  Radiological Exams on Admission: No results found.   Assessment/Plan Present on Admission: . Persistent UTI during pregnancy . Pyelonephritis affecting pregnancy  Ceftriaxone 2 g IV Q 12 hr  Code Status: full  Disposition Plan: admission   Time spent: 25 min  05/05/2019, MD 10/07/2019 4:30 PM Faculty Practice Attending Physician [facility] Attending Phone #: [phone]  **Disclaimer: This note may have been dictated with voice recognition software. Similar sounding words can inadvertently be transcribed and this note may contain transcription errors which may not have been corrected upon publication of note.**

## 2019-10-07 NOTE — MAU Provider Note (Signed)
Patient in waiting room and provider reviews chart. Patient presents with c/o fever and bilateral back pain in setting of known chronic renal disease and UTI with E.Coli.  Provider calls attending Dr. Marcie Bal with recommendation for admission after reviewing patient history and complaint.  Agrees. Provider to triage to discuss POC with patient.   S: [name] is a 24 y.o. G2P1001 at 22.3 weeks who receives care at [facility].  She presents to MAU today with complaint of fever and bilateral back pain for ~3weeks.  Patient states she was started on Keflex 2 days ago.  She states last night she had a fever of 100.7 that was 99.9 this morning.  She states she has taken tylenol at 10am. Patient also reports her pain is a 8/10 and was unresponsive to tylenol.   O: BP 117/65 (BP Location: Right Arm)   Pulse (!) 117   Temp 99.7 F (37.6 C) (Oral)   Resp 20   Ht 5\' 6"  (1.676 m)   Wt 80.6 kg   LMP 05/03/2019 (Approximate)   SpO2 99%   BMI 28.70 kg/m  Physical Exam Vitals and nursing note reviewed.  Constitutional:      Appearance: Normal appearance.  HENT:     Head: Normocephalic and atraumatic.  Eyes:     Conjunctiva/sclera: Conjunctivae normal.  Cardiovascular:     Rate and Rhythm: Normal rate and regular rhythm.     Heart sounds: Normal heart sounds.  Pulmonary:     Effort: Pulmonary effort is normal. No respiratory distress.     Breath sounds: Normal breath sounds.  Abdominal:     General: Bowel sounds are normal.     Tenderness: There is right CVA tenderness and left CVA tenderness (Mild).     Comments: Abd-Gravid Soft, NT  Musculoskeletal:        General: Normal range of motion.     Cervical back: Normal range of motion.  Skin:    General: Skin is warm and dry.  Neurological:     Mental Status: She is alert and oriented to person, place, and time.  Psychiatric:        Mood and Affect: Mood normal.        Thought Content: Thought content normal.     A:24 year  old G2P1001 at 22.3 weeks Pyelo Chronic Renal Disease   P: Patient informed of plan for admission and IV antibiotics. -Patient verbalized understanding and has no questions or concerns. -Staff notified of pending admission on OBSCU.   05/05/2019, CNM 10/07/2019 3:25 PM

## 2019-10-07 NOTE — Progress Notes (Signed)
Charlotte Smith, CNM into see pt and discuss POC.  Pt to be admitted to [facility] for pyelo.

## 2019-10-07 NOTE — MAU Note (Signed)
States sent from MD office for UTI, states has had UTI for 2 weeks, on antibiotics, but antibiotics not working.  Also c/o fever, took Tylenol @ 1000 this morning and bilateral back pain.

## 2019-10-08 DIAGNOSIS — O2302 Infections of kidney in pregnancy, second trimester: Principal | ICD-10-CM

## 2019-10-08 DIAGNOSIS — Z3A26 26 weeks gestation of pregnancy: Secondary | ICD-10-CM

## 2019-10-08 DIAGNOSIS — N12 Tubulo-interstitial nephritis, not specified as acute or chronic: Secondary | ICD-10-CM

## 2019-10-08 LAB — URINE CULTURE

## 2019-10-08 LAB — SPECIMEN STATUS REPORT

## 2019-10-08 MED ORDER — PANTOPRAZOLE SODIUM 40 MG PO TBEC
40.0000 mg | DELAYED_RELEASE_TABLET | Freq: Every day | ORAL | Status: DC
  Administered 2019-10-08 – 2019-10-09 (×2): 40 mg via ORAL
  Filled 2019-10-08 (×2): qty 1

## 2019-10-09 ENCOUNTER — Other Ambulatory Visit: Payer: Self-pay | Admitting: Obstetrics and Gynecology

## 2019-10-09 DIAGNOSIS — N10 Acute tubulo-interstitial nephritis: Secondary | ICD-10-CM

## 2019-10-09 DIAGNOSIS — Z3A22 22 weeks gestation of pregnancy: Secondary | ICD-10-CM

## 2019-10-09 DIAGNOSIS — B9620 Unspecified Escherichia coli [E. coli] as the cause of diseases classified elsewhere: Secondary | ICD-10-CM

## 2019-10-09 MED ORDER — SULFAMETHOXAZOLE-TRIMETHOPRIM 800-160 MG PO TABS: 1.0000 | ORAL_TABLET | Freq: Two times a day (BID) | ORAL | 0 refills | Status: AC

## 2019-10-09 NOTE — Discharge Summary (Signed)
Physician Discharge Summary  Patient ID: Charlotte Smith MRN: [id number] DOB/AGE: 14-Feb-1995 24 y.o.  Admit date: 10/07/2019 Discharge date: 10/09/2019  Admission Diagnoses: pregnancy at 22wks. Bilateral pyelonephritis.   Discharge Diagnoses: Patient signed out AMA  Discharged Condition: good  Hospital Course: patient admitted and placed on IV rocephin x 3d with last dose on 9/19. UCx sensitivities are below; no renal ultrasound was done during her hospitalization. Patient wanted to leave AMA. I recommended we transition her to PO abx and watch her overnight and discharge her the next day if she was doing well but she wanted to leave. Patient signed out AMA. abx sent in for her and inbasket message sent to [facility] to see her middle of this week. I d/w ID re: her and they recommend no suppression to limit risk of breeding out resistant organisms and bactrim should be fine, in light of her current Cr.    Consults: None  Significant Diagnostic Studies:  Urine Culture, Routine Final reportAbnormal   Organism ID, Bacteria Escherichia coliAbnormal   Comment: Greater than 100,000 colony forming units per mL  Antimicrobial Susceptibility Comment   Comment:   ** S = Susceptible; I = Intermediate; R = Resistant **           P = Positive; N = Negative        MICS are expressed in micrograms per mL   Antibiotic         RSLT#1  RSLT#2  RSLT#3  RSLT#4  Amoxicillin/Clavulanic Acid  R  Ampicillin           I  Cefepime            S  Ceftriaxone          S  Cefuroxime           R  Ciprofloxacin         S  Ertapenem           S  Gentamicin           S  Imipenem            S  Levofloxacin          S  Meropenem           S  Nitrofurantoin         S  Piperacillin/Tazobactam    S  Tetracycline          S  Tobramycin            S  Trimethoprim/Sulfa       S      CBC Latest Ref Rng & Units 10/07/2019 08/01/2019 01/28/2018  WBC 4.0 - 10.5 K/uL 11.0(H) 12.4(H) 19.2(H)  Hemoglobin 12.0 - 15.0 g/dL 0.1(B) 51.0 25.8  Hematocrit 36 - 46 % 28.7(L) 36.7 43.5  Platelets 150 - 400 K/uL 210 242 260   CMP Latest Ref Rng & Units 10/07/2019 08/01/2019 01/28/2018  Glucose 70 - 99 mg/dL 98 82 527(P)  BUN 6 - 20 mg/dL 8 12 82(U)  Creatinine 0.44 - 1.00 mg/dL 2.35(T) 6.14 4.31(V)  Sodium 135 - 145 mmol/L 133(L) 139 138  Potassium 3.5 - 5.1 mmol/L 4.0 4.2 3.5  Chloride 98 - 111 mmol/L 98 101 105  CO2 22 - 32 mmol/L 24 22 21(L)  Calcium 8.9 - 10.3 mg/dL 4.0(G) 9.7 9.8  Total Protein 6.5 - 8.1 g/dL 6.3(L) 7.3 8.7(H)  Total Bilirubin 0.3 - 1.2 mg/dL 0.6 <8.6 1.1  Alkaline Phos 38 -  126 U/L 68 57 56  AST 15 - 41 U/L 12(L) 13 15  ALT 0 - 44 U/L 12 11 14      Discharge Exam: Blood pressure 105/64, pulse 87, temperature 98.1 F (36.7 C), temperature source Oral, resp. rate 16, height 5\' 6"  (1.676 m), weight 80.6 kg, last menstrual period 05/03/2019, SpO2 99 %. Patient left AMA  Disposition:    Allergies as of 10/09/2019   No Known Allergies   Medciations Bactrim DS bid x 3 days       Signed: 05/05/2019 10/09/2019, 6:11 PM

## 2019-10-09 NOTE — Progress Notes (Signed)
FACULTY PRACTICE ANTEPARTUM(COMPREHENSIVE) NOTE  Charlotte Smith is a 24 y.o. G2P1001 with Estimated Date of Delivery: 02/07/20   By   [redacted]w[redacted]d  who is admitted for pyelonephritis.    Fetal presentation is unsure. Length of Stay:  2  Days  Date of admission:10/07/2019  Subjective: Feels much better Patient reports the fetal movement as active. Patient reports uterine contraction  activity as none Patient reports  vaginal bleeding as none. Patient describes fluid per vagina as None.  Vitals:  Blood pressure (!) 82/45, pulse 66, temperature 97.6 F (36.4 C), temperature source Oral, resp. rate 17, height 5\' 6"  (1.676 m), weight 80.6 kg, last menstrual period 05/03/2019, SpO2 98 %. Vitals:   10/08/19 1739 10/08/19 2002 10/08/19 2158 10/09/19 0410  BP:  114/71 124/66 (!) 82/45  Pulse:  (!) 108 (!) 119 66  Resp:    17  Temp: 98.8 F (37.1 C) 99.2 F (37.3 C) 98.9 F (37.2 C) 97.6 F (36.4 C)  TempSrc: Oral Oral Oral Oral  SpO2:  98% 98% 98%  Weight:      Height:       Physical Examination:  General appearance - alert, well appearing, and in no distress Back exam - minimal CVAT Fundal Height:  size equals dates Pelvic Exam:  examination not indicated Cervical Exam: Not evaluated.. Extremities: extremities normal, atraumatic, no cyanosis or edema with DTRs 2+ bilaterally Membranes:intact  Fetal Monitoring:  Doppler 150s     Labs:  No results found for this or any previous visit (from the past 24 hour(s)).  Imaging Studies:    No results found.   Medications:  Scheduled . pantoprazole  40 mg Oral Daily  . prenatal multivitamin  1 tablet Oral Q1200     ASSESSMENT: G2P1001 [redacted]w[redacted]d Estimated Date of Delivery: 02/07/20  Patient Active Problem List   Diagnosis Date Noted  . Pyelonephritis affecting pregnancy 10/07/2019  . Persistent UTI during pregnancy 08/01/2019  . Supervision of high risk pregnancy, antepartum 08/01/2019  . History of cesarean delivery 07/21/2019  .  Chronic renal disease 03/13/2014    PLAN: Rocephin for 3 doses(72 hours) or as clinically indicated Continue on suppression thereafter  03/15/2014

## 2019-10-09 NOTE — Progress Notes (Signed)
Discharged home AMA, ambulatory, in stable condition.

## 2019-10-09 NOTE — Progress Notes (Signed)
FACULTY PRACTICE ANTEPARTUM(COMPREHENSIVE) NOTE  Verena Shawgo is a 24 y.o. G2P1001 with Estimated Date of Delivery: 02/07/20   By   [redacted]w[redacted]d  who is admitted for pyelonephritis.    Fetal presentation is unsure. Length of Stay:  2  Days  Date of admission:10/07/2019  Subjective: Feels much better Patient reports the fetal movement as active. Patient reports uterine contraction  activity as none Patient reports  vaginal bleeding as none. Patient describes fluid per vagina as None.  Vitals:  Blood pressure (!) 82/45, pulse 66, temperature 97.6 F (36.4 C), temperature source Oral, resp. rate 17, height 5\' 6"  (1.676 m), weight 80.6 kg, last menstrual period 05/03/2019, SpO2 98 %. Vitals:   10/08/19 1739 10/08/19 2002 10/08/19 2158 10/09/19 0410  BP:  114/71 124/66 (!) 82/45  Pulse:  (!) 108 (!) 119 66  Resp:    17  Temp: 98.8 F (37.1 C) 99.2 F (37.3 C) 98.9 F (37.2 C) 97.6 F (36.4 C)  TempSrc: Oral Oral Oral Oral  SpO2:  98% 98% 98%  Weight:      Height:       Physical Examination:  General appearance - alert, well appearing, and in no distress Back exam - minimal CVAT Fundal Height:  size equals dates Pelvic Exam:  examination not indicated Cervical Exam: Not evaluated.. Extremities: extremities normal, atraumatic, no cyanosis or edema with DTRs 2+ bilaterally Membranes:intact  Fetal Monitoring:  Doppler 150s     Labs:  No results found for this or any previous visit (from the past 24 hour(s)).  Imaging Studies:    No results found.   Medications:  Scheduled . pantoprazole  40 mg Oral Daily  . prenatal multivitamin  1 tablet Oral Q1200     ASSESSMENT: G2P1001 [redacted]w[redacted]d Estimated Date of Delivery: 02/07/20  Patient Active Problem List   Diagnosis Date Noted  . Pyelonephritis affecting pregnancy 10/07/2019  . Persistent UTI during pregnancy 08/01/2019  . Supervision of high risk pregnancy, antepartum 08/01/2019  . History of cesarean delivery 07/21/2019  .  Chronic renal disease 03/13/2014    PLAN: Rocephin for 3 doses(72 hours) or as clinically indicated 10 days of bactrim DS then Continue on macrobid suppression thereafter(resistent to keflex) Will plan to discharge after PM dose of her rocephin 03/15/2014    Patient ID: Lazaro Arms, female   DOB: [birth year], 25 y.o.   MRN: [id number]

## 2019-10-09 NOTE — Progress Notes (Signed)
Called into patient's room to find patient packing bag and stating " I want to go home." Dr. Vergie Living called and informed of same. Patient was offered the alternative of staying for 24 hours and likely being discharged tomorrow evening if she remains afebrile. She remained adamant that she wanted to leave AMA. Dr. Vergie Living informed of same and stated he would submit Rx for PO antibiotic to patient's pharmacy of choice. Instructed Charlotte Smith to pick up antibiotic Rx and be sure to finsish Rx in its entirety. Patient verbalized understanding of this instruction.

## 2019-10-12 ENCOUNTER — Encounter: Payer: No Typology Code available for payment source | Admitting: Obstetrics and Gynecology

## 2019-11-02 ENCOUNTER — Ambulatory Visit (INDEPENDENT_AMBULATORY_CARE_PROVIDER_SITE_OTHER): Payer: No Typology Code available for payment source

## 2019-11-02 ENCOUNTER — Ambulatory Visit (INDEPENDENT_AMBULATORY_CARE_PROVIDER_SITE_OTHER): Payer: No Typology Code available for payment source | Admitting: Women's Health

## 2019-11-02 ENCOUNTER — Other Ambulatory Visit: Payer: No Typology Code available for payment source

## 2019-11-02 ENCOUNTER — Encounter: Payer: Self-pay | Admitting: Women's Health

## 2019-11-02 VITALS — BP 123/80 | HR 104 | Wt 180.0 lb

## 2019-11-02 DIAGNOSIS — O0990 Supervision of high risk pregnancy, unspecified, unspecified trimester: Secondary | ICD-10-CM

## 2019-11-02 DIAGNOSIS — N189 Chronic kidney disease, unspecified: Secondary | ICD-10-CM

## 2019-11-02 DIAGNOSIS — Z1389 Encounter for screening for other disorder: Secondary | ICD-10-CM

## 2019-11-02 DIAGNOSIS — O99891 Other specified diseases and conditions complicating pregnancy: Secondary | ICD-10-CM

## 2019-11-02 DIAGNOSIS — O2342 Unspecified infection of urinary tract in pregnancy, second trimester: Secondary | ICD-10-CM

## 2019-11-02 DIAGNOSIS — Z331 Pregnant state, incidental: Secondary | ICD-10-CM

## 2019-11-02 DIAGNOSIS — Z3A26 26 weeks gestation of pregnancy: Secondary | ICD-10-CM

## 2019-11-02 DIAGNOSIS — O0992 Supervision of high risk pregnancy, unspecified, second trimester: Secondary | ICD-10-CM

## 2019-11-02 DIAGNOSIS — Z23 Encounter for immunization: Secondary | ICD-10-CM | POA: Diagnosis not present

## 2019-11-02 DIAGNOSIS — Z131 Encounter for screening for diabetes mellitus: Secondary | ICD-10-CM

## 2019-11-02 LAB — POCT URINALYSIS DIPSTICK OB
Ketones, UA: NEGATIVE
Leukocytes, UA: NEGATIVE
Nitrite, UA: NEGATIVE

## 2019-11-02 NOTE — Progress Notes (Signed)
HIGH-RISK PREGNANCY VISIT Patient name: Charlotte Smith MRN [id number]  Date of birth: October 23, 1995 Chief Complaint:   High Risk Gestation (PN2 today)  History of Present Illness:   Charlotte Smith is a 24 y.o. G63P1001 female at [redacted]w[redacted]d with an Estimated Date of Delivery: 02/07/20 being seen today for ongoing management of a high-risk pregnancy complicated by chronic renal disease.  Today she reports no complaints.  Depression screen [facility] 2/9 11/02/2019 08/01/2019 08/01/2019  Decreased Interest 0 0 0  Down, Depressed, Hopeless 0 0 0  PHQ - 2 Score 0 0 0  Altered sleeping 0 0 0  Tired, decreased energy 0 2 2  Change in appetite 0 2 2  Feeling bad or failure about yourself  0 0 0  Trouble concentrating 0 0 0  Moving slowly or fidgety/restless 0 0 0  Suicidal thoughts 0 0 0  PHQ-9 Score 0 4 4    Contractions: Not present. Vag. Bleeding: None.  Movement: Present. denies leaking of fluid.  Review of Systems:   Pertinent items are noted in HPI Denies abnormal vaginal discharge w/ itching/odor/irritation, headaches, visual changes, shortness of breath, chest pain, abdominal pain, severe nausea/vomiting, or problems with urination or bowel movements unless otherwise stated above. Pertinent History Reviewed:  Reviewed past medical,surgical, social, obstetrical and family history.  Reviewed problem list, medications and allergies. Physical Assessment:   Vitals:   11/02/19 1041  BP: 123/80  Pulse: (!) 104  Weight: 180 lb (81.6 kg)  Body mass index is 29.05 kg/m.           Physical Examination:   General appearance: alert, well appearing, and in no distress  Mental status: alert, oriented to person, place, and time  Skin: warm & dry   Extremities: Edema: None    Cardiovascular: normal heart rate noted  Respiratory: normal respiratory effort, no distress  Abdomen: gravid, soft, non-tender  Pelvic: Cervical exam deferred         Fetal Status: Fetal Heart Rate (bpm): 152 u/s   Movement:  Present    Fetal Surveillance Testing today:US 26+1 wks,cephalic,posterior placenta gr 0,normal ovaries,cx 3.9 cm,fhr 152 bpm,svp of fluid 3.6 cm,EFW 1041 g 81%  Chaperone: n/a    Results for orders placed or performed in visit on 11/02/19 (from the past 24 hour(s))  POC Urinalysis Dipstick OB   Collection Time: 11/02/19 10:43 AM  Result Value Ref Range   Color, UA     Clarity, UA     Glucose, UA Trace (A) Negative   Bilirubin, UA     Ketones, UA neg    Spec Grav, UA     Blood, UA 2+    pH, UA     POC,PROTEIN,UA Moderate (2+) Negative, Trace, Small (1+), Moderate (2+), Large (3+), 4+   Urobilinogen, UA     Nitrite, UA neg    Leukocytes, UA Negative Negative   Appearance     Odor      Assessment & Plan:  1) High-risk pregnancy G2P1001 at [redacted]w[redacted]d with an Estimated Date of Delivery: 02/07/20   2) Chronic renal disease, Rt kidney deteriorated d/t VUR, last serum Cr 1.07 on 9/17, hasn't seen nephrologist during pregnancy- saw Dr. Wolfgang Phoenix last year- would rather see someone else, note routed to Tish to get referral to [facility] in [place] Continue daily ASA  3) Frequent uti's w/ recent pyelo, per d/c note on 9/19, ID recommended no suppression to limit risk of breeding out resistant organisms. Pt reports she is taking keflex  daily. Will discuss w/ [facility] tomorrow when he returns. Send urine cx today  4) Prev c/s> for RCS w/ BTL  Meds: No orders of the defined types were placed in this encounter.   Labs/procedures today: pn2, tdap, got flu shot at work  Treatment Plan:  U/S q4wks    2x/wk testing @ 32wks or weekly BPP    Deliver @ 37wks or as per MFM  Reviewed: Preterm labor symptoms and general obstetric precautions including but not limited to vaginal bleeding, contractions, leaking of fluid and fetal movement were reviewed in detail with the patient.  All questions were answered. Has home bp cuff.  Check bp weekly, let us know if >140/90.   Follow-up: Return in about 4 weeks  (around 11/30/2019) for HROB, US:EFW, in person, MD or CNM.  Orders Placed This Encounter  Procedures  . Urine Culture  . Tdap vaccine greater than or equal to 7yo IM  . POC Urinalysis Dipstick OB   Cheral Marker CNM, [facility] 11/02/2019 1:49 PM

## 2019-11-02 NOTE — Progress Notes (Signed)
[place] 26+1 wks,cephalic,posterior placenta gr 0,normal ovaries,cx 3.9 cm,fhr 152 bpm,svp of fluid 3.6 cm,EFW 1041 g 81%

## 2019-11-02 NOTE — Patient Instructions (Signed)
Charlotte Smith, I greatly value your feedback.  If you receive a survey following your visit with [place] today, we appreciate you taking the time to fill it out.  Thanks, Charlotte Smith, CNM, WHNP-BC   [facility] ([address] [place], [place] [address]) Entrance C, located off of [address] valet parking  Go to [email or web address] to register for FREE online childbirth classes   Call the office [phone]) or go to [facility] if:  You begin to have strong, frequent contractions  Your water breaks.  Sometimes it is a big gush of fluid, sometimes it is just a trickle that keeps getting your panties wet or running down your legs  You have vaginal bleeding.  It is normal to have a small amount of spotting if your cervix was checked.   You don't feel your baby moving like normal.  If you don't, get you something to eat and drink and lay down and focus on feeling your baby move.  You should feel at least 10 movements in 2 hours.  If you don't, you should call the office or go to [facility].    Tdap Vaccine  It is recommended that you get the Tdap vaccine during the third trimester of EACH pregnancy to help protect your baby from getting pertussis (whooping cough)  27-36 weeks is the BEST time to do this so that you can pass the protection on to your baby. During pregnancy is better than after pregnancy, but if you are unable to get it during pregnancy it will be offered at the hospital.   You can get this vaccine with [place], at the health department, your family doctor, or some local pharmacies  Everyone who will be around your baby should also be up-to-date on their vaccines before the baby comes. Adults (who are not pregnant) only need 1 dose of Tdap during adulthood.   [place] Pediatricians/Family Doctors:  [facility] [phone]            [facility] [phone]                 [facility]  [phone] (usually not accepting new patients unless you have family there already, you are always welcome to call and ask)       [facility] Department [phone]       [facility] Pediatricians/Family Doctors:   [facility]: [phone]  [facility]: [phone]  [facility]: [phone]  [facility] Doctors:   [facility]: [phone]   [facility]: [phone]  [facility] Doctors:  [facility]: [phone]   Home Blood Pressure Monitoring for Patients   Your provider has recommended that you check your blood pressure (BP) at least once a week at home. If you do not have a blood pressure cuff at home, one will be provided for you. Contact your provider if you have not received your monitor within 1 week.   Helpful Tips for Accurate Home Blood Pressure Checks   Don't smoke, exercise, or drink caffeine 30 minutes before checking your BP  Use the restroom before checking your BP (a full bladder can raise your pressure)  Relax in a comfortable upright chair  Feet on the ground  Left arm resting comfortably on a flat surface at the level of your heart  Legs uncrossed  Back supported  Sit quietly and don't talk  Place the cuff on your bare  arm  Adjust snuggly, so that only two fingertips can fit between your skin and the top of the cuff  Check 2 readings separated by at least one minute  Keep a log of your BP readings  For a visual, please reference this diagram: http://ccnc.care/bpdiagram  Provider Name: [facility]     Phone: [phone]  Zone 1: ALL CLEAR  Continue to monitor your symptoms:   BP reading is less than 140 (top number) or less than 90 (bottom number)   No right upper stomach pain  No headaches or seeing spots  No feeling nauseated or throwing up  No swelling in face and hands  Zone 2: CAUTION Call your  doctor's office for any of the following:   BP reading is greater than 140 (top number) or greater than 90 (bottom number)   Stomach pain under your ribs in the middle or right side  Headaches or seeing spots  Feeling nauseated or throwing up  Swelling in face and hands  Zone 3: EMERGENCY  Seek immediate medical care if you have any of the following:   BP reading is greater than160 (top number) or greater than 110 (bottom number)  Severe headaches not improving with Tylenol  Serious difficulty catching your breath  Any worsening symptoms from Zone 2   Third Trimester of Pregnancy The third trimester is from week 29 through week 42, months 7 through 9. The third trimester is a time when the fetus is growing rapidly. At the end of the ninth month, the fetus is about 20 inches in length and weighs 6-10 pounds.  BODY CHANGES Your body goes through many changes during pregnancy. The changes vary from woman to woman.   Your weight will continue to increase. You can expect to gain 25-35 pounds (11-16 kg) by the end of the pregnancy.  You may begin to get stretch marks on your hips, abdomen, and breasts.  You may urinate more often because the fetus is moving lower into your pelvis and pressing on your bladder.  You may develop or continue to have heartburn as a result of your pregnancy.  You may develop constipation because certain hormones are causing the muscles that push waste through your intestines to slow down.  You may develop hemorrhoids or swollen, bulging veins (varicose veins).  You may have pelvic pain because of the weight gain and pregnancy hormones relaxing your joints between the bones in your pelvis. Backaches may result from overexertion of the muscles supporting your posture.  You may have changes in your hair. These can include thickening of your hair, rapid growth, and changes in texture. Some women also have hair loss during or after pregnancy, or hair that  feels dry or thin. Your hair will most likely return to normal after your baby is born.  Your breasts will continue to grow and be tender. A yellow discharge may leak from your breasts called colostrum.  Your belly button may stick out.  You may feel short of breath because of your expanding uterus.  You may notice the fetus "dropping," or moving lower in your abdomen.  You may have a bloody mucus discharge. This usually occurs a few days to a week before labor begins.  Your cervix becomes thin and soft (effaced) near your due date. WHAT TO EXPECT AT YOUR PRENATAL EXAMS  You will have prenatal exams every 2 weeks until week 36. Then, you will have weekly prenatal exams. During a routine prenatal visit:  You  will be weighed to make sure you and the fetus are growing normally.  Your blood pressure is taken.  Your abdomen will be measured to track your baby's growth.  The fetal heartbeat will be listened to.  Any test results from the previous visit will be discussed.  You may have a cervical check near your due date to see if you have effaced. At around 36 weeks, your caregiver will check your cervix. At the same time, your caregiver will also perform a test on the secretions of the vaginal tissue. This test is to determine if a type of bacteria, Group B streptococcus, is present. Your caregiver will explain this further. Your caregiver may ask you:  What your birth plan is.  How you are feeling.  If you are feeling the baby move.  If you have had any abnormal symptoms, such as leaking fluid, bleeding, severe headaches, or abdominal cramping.  If you have any questions. Other tests or screenings that may be performed during your third trimester include:  Blood tests that check for low iron levels (anemia).  Fetal testing to check the health, activity level, and growth of the fetus. Testing is done if you have certain medical conditions or if there are problems during the  pregnancy. FALSE LABOR You may feel small, irregular contractions that eventually go away. These are called Braxton Hicks contractions, or false labor. Contractions may last for hours, days, or even weeks before true labor sets in. If contractions come at regular intervals, intensify, or become painful, it is best to be seen by your caregiver.  SIGNS OF LABOR   Menstrual-like cramps.  Contractions that are 5 minutes apart or less.  Contractions that start on the top of the uterus and spread down to the lower abdomen and back.  A sense of increased pelvic pressure or back pain.  A watery or bloody mucus discharge that comes from the vagina. If you have any of these signs before the 37th week of pregnancy, call your caregiver right away. You need to go to the hospital to get checked immediately. HOME CARE INSTRUCTIONS   Avoid all smoking, herbs, alcohol, and unprescribed drugs. These chemicals affect the formation and growth of the baby.  Follow your caregiver's instructions regarding medicine use. There are medicines that are either safe or unsafe to take during pregnancy.  Exercise only as directed by your caregiver. Experiencing uterine cramps is a good sign to stop exercising.  Continue to eat regular, healthy meals.  Wear a good support bra for breast tenderness.  Do not use hot tubs, steam rooms, or saunas.  Wear your seat belt at all times when driving.  Avoid raw meat, uncooked cheese, cat litter boxes, and soil used by cats. These carry germs that can cause birth defects in the baby.  Take your prenatal vitamins.  Try taking a stool softener (if your caregiver approves) if you develop constipation. Eat more high-fiber foods, such as fresh vegetables or fruit and whole grains. Drink plenty of fluids to keep your urine clear or pale yellow.  Take warm sitz baths to soothe any pain or discomfort caused by hemorrhoids. Use hemorrhoid cream if your caregiver approves.  If you  develop varicose veins, wear support hose. Elevate your feet for 15 minutes, 3-4 times a day. Limit salt in your diet.  Avoid heavy lifting, wear low heal shoes, and practice good posture.  Rest a lot with your legs elevated if you have leg cramps or low back  pain.  Visit your dentist if you have not gone during your pregnancy. Use a soft toothbrush to brush your teeth and be gentle when you floss.  A sexual relationship may be continued unless your caregiver directs you otherwise.  Do not travel far distances unless it is absolutely necessary and only with the approval of your caregiver.  Take prenatal classes to understand, practice, and ask questions about the labor and delivery.  Make a trial run to the hospital.  Pack your hospital bag.  Prepare the baby's nursery.  Continue to go to all your prenatal visits as directed by your caregiver. SEEK MEDICAL CARE IF:  You are unsure if you are in labor or if your water has broken.  You have dizziness.  You have mild pelvic cramps, pelvic pressure, or nagging pain in your abdominal area.  You have persistent nausea, vomiting, or diarrhea.  You have a bad smelling vaginal discharge.  You have pain with urination. SEEK IMMEDIATE MEDICAL CARE IF:   You have a fever.  You are leaking fluid from your vagina.  You have spotting or bleeding from your vagina.  You have severe abdominal cramping or pain.  You have rapid weight loss or gain.  You have shortness of breath with chest pain.  You notice sudden or extreme swelling of your face, hands, ankles, feet, or legs.  You have not felt your baby move in over an hour.  You have severe headaches that do not go away with medicine.  You have vision changes. Document Released: 12/31/2000 Document Revised: 01/11/2013 Document Reviewed: 03/09/2012 [facility] Patient Information 2015 [place], [place]. This information is not intended to replace advice given to you by your health  care provider. Make sure you discuss any questions you have with your health care provider.

## 2019-11-03 LAB — COMPREHENSIVE METABOLIC PANEL
ALT: 9 IU/L (ref 0–32)
AST: 11 IU/L (ref 0–40)
Albumin/Globulin Ratio: 1.5 (ref 1.2–2.2)
Albumin: 3.9 g/dL (ref 3.9–5.0)
Alkaline Phosphatase: 68 IU/L (ref 44–121)
BUN/Creatinine Ratio: 8 — ABNORMAL LOW (ref 9–23)
BUN: 6 mg/dL (ref 6–20)
Bilirubin Total: 0.2 mg/dL (ref 0.0–1.2)
CO2: 22 mmol/L (ref 20–29)
Calcium: 8.8 mg/dL (ref 8.7–10.2)
Chloride: 103 mmol/L (ref 96–106)
Creatinine, Ser: 0.8 mg/dL (ref 0.57–1.00)
GFR calc Af Amer: 118 mL/min/{1.73_m2} (ref >59)
GFR calc non Af Amer: 102 mL/min/{1.73_m2} (ref >59)
Globulin, Total: 2.6 g/dL (ref 1.5–4.5)
Glucose: 85 mg/dL (ref 65–99)
Potassium: 4.4 mmol/L (ref 3.5–5.2)
Sodium: 138 mmol/L (ref 134–144)
Total Protein: 6.5 g/dL (ref 6.0–8.5)

## 2019-11-03 LAB — CBC
Hematocrit: 33.2 % — ABNORMAL LOW (ref 34.0–46.6)
Hemoglobin: 11.3 g/dL (ref 11.1–15.9)
MCH: 29.6 pg (ref 26.6–33.0)
MCHC: 34 g/dL (ref 31.5–35.7)
MCV: 87 fL (ref 79–97)
Platelets: 205 10*3/uL (ref 150–450)
RBC: 3.82 x10E6/uL (ref 3.77–5.28)
RDW: 13.2 % (ref 11.7–15.4)
WBC: 8.7 10*3/uL (ref 3.4–10.8)

## 2019-11-03 LAB — HIV ANTIBODY (ROUTINE TESTING W REFLEX): HIV Screen 4th Generation wRfx: NONREACTIVE

## 2019-11-03 LAB — ANTIBODY SCREEN: Antibody Screen: NEGATIVE

## 2019-11-03 LAB — GLUCOSE TOLERANCE, 2 HOURS W/ 1HR
Glucose, 1 hour: 143 mg/dL (ref 65–179)
Glucose, 2 hour: 113 mg/dL (ref 65–152)
Glucose, Fasting: 85 mg/dL (ref 65–91)

## 2019-11-03 LAB — RPR: RPR Ser Ql: NONREACTIVE

## 2019-11-04 LAB — URINE CULTURE

## 2019-11-29 ENCOUNTER — Other Ambulatory Visit: Payer: Self-pay | Admitting: Women's Health

## 2019-11-29 DIAGNOSIS — O26832 Pregnancy related renal disease, second trimester: Secondary | ICD-10-CM

## 2019-11-30 ENCOUNTER — Ambulatory Visit (INDEPENDENT_AMBULATORY_CARE_PROVIDER_SITE_OTHER): Payer: No Typology Code available for payment source | Admitting: Advanced Practice Midwife

## 2019-11-30 ENCOUNTER — Ambulatory Visit (INDEPENDENT_AMBULATORY_CARE_PROVIDER_SITE_OTHER): Payer: No Typology Code available for payment source

## 2019-11-30 VITALS — BP 118/74 | HR 101 | Wt 192.0 lb

## 2019-11-30 DIAGNOSIS — N189 Chronic kidney disease, unspecified: Secondary | ICD-10-CM

## 2019-11-30 DIAGNOSIS — Z1389 Encounter for screening for other disorder: Secondary | ICD-10-CM

## 2019-11-30 DIAGNOSIS — Z331 Pregnant state, incidental: Secondary | ICD-10-CM

## 2019-11-30 DIAGNOSIS — Z3A30 30 weeks gestation of pregnancy: Secondary | ICD-10-CM | POA: Diagnosis not present

## 2019-11-30 DIAGNOSIS — O26832 Pregnancy related renal disease, second trimester: Secondary | ICD-10-CM | POA: Diagnosis not present

## 2019-11-30 DIAGNOSIS — O2343 Unspecified infection of urinary tract in pregnancy, third trimester: Secondary | ICD-10-CM

## 2019-11-30 LAB — POCT URINALYSIS DIPSTICK OB
Glucose, UA: NEGATIVE
Ketones, UA: NEGATIVE
Leukocytes, UA: NEGATIVE
Nitrite, UA: NEGATIVE

## 2019-11-30 NOTE — Progress Notes (Signed)
[place] 30+1 wks,cephalic,posterior placenta gr 1,AFI 12 cm,fhr 144 bpm,EFW 1760 g 81%

## 2019-11-30 NOTE — Progress Notes (Signed)
HIGH-RISK PREGNANCY VISIT Patient name: Charlotte Smith MRN [id number]  Date of birth: 18-Sep-1995 Chief Complaint:   Routine Prenatal Visit  History of Present Illness:   Charlotte Smith is a 24 y.o. G35P1001 female at [redacted]w[redacted]d with an Estimated Date of Delivery: 02/07/20 being seen today for ongoing management of a high-risk pregnancy complicated by chronic renal disease.  Today she reports feeling well; no dysuria; is taking Keflex suppression per Dr Forestine Chute recommendation.  Depression screen [facility] 2/9 11/02/2019 08/01/2019 08/01/2019  Decreased Interest 0 0 0  Down, Depressed, Hopeless 0 0 0  PHQ - 2 Score 0 0 0  Altered sleeping 0 0 0  Tired, decreased energy 0 2 2  Change in appetite 0 2 2  Feeling bad or failure about yourself  0 0 0  Trouble concentrating 0 0 0  Moving slowly or fidgety/restless 0 0 0  Suicidal thoughts 0 0 0  PHQ-9 Score 0 4 4    Contractions: Not present. Vag. Bleeding: None.  Movement: Present. denies leaking of fluid.  Review of Systems:   Pertinent items are noted in HPI Denies abnormal vaginal discharge w/ itching/odor/irritation, headaches, visual changes, shortness of breath, chest pain, abdominal pain, severe nausea/vomiting, or problems with urination or bowel movements unless otherwise stated above. Pertinent History Reviewed:  Reviewed past medical,surgical, social, obstetrical and family history.  Reviewed problem list, medications and allergies. Physical Assessment:   Vitals:   11/30/19 1414  BP: 118/74  Pulse: (!) 101  Weight: 87.1 kg  Body mass index is 30.99 kg/m.           Physical Examination:   General appearance: alert, well appearing, and in no distress  Mental status: alert, oriented to person, place, and time  Skin: warm & dry   Extremities: Edema: None    Cardiovascular: normal heart rate noted  Respiratory: normal respiratory effort, no distress  Abdomen: gravid, soft, non-tender  Pelvic: Cervical exam deferred         Fetal  Status: Fetal Heart Rate (bpm): 144 u/s   Movement: Present    Fetal Surveillance Testing today: [place] 30+1 wks,cephalic,posterior placenta gr 1,AFI 12 cm,fhr 144 bpm,EFW 1760 g 81%      Results for orders placed or performed in visit on 11/30/19 (from the past 24 hour(s))  POC Urinalysis Dipstick OB   Collection Time: 11/30/19  2:13 PM  Result Value Ref Range   Color, UA     Clarity, UA     Glucose, UA Negative Negative   Bilirubin, UA     Ketones, UA neg    Spec Grav, UA     Blood, UA large    pH, UA     POC,PROTEIN,UA Moderate (2+) Negative, Trace, Small (1+), Moderate (2+), Large (3+), 4+   Urobilinogen, UA     Nitrite, UA neg    Leukocytes, UA Negative Negative   Appearance     Odor      Assessment & Plan:  1) High-risk pregnancy G2P1001 at [redacted]w[redacted]d with an Estimated Date of Delivery: 02/07/20   2) Chronic renal disease, R kidney deteriorated d/t VUR; she was supposed to be referred to [facility] in [place]- will have Tish check on this; still taking Keflex suppression; will get urine culture today; EFW today 81st%- next scan @ 34wks   3) Prev c/s, for rLTCS & BTL @ 37wks (will schedule at next visit)  Meds: No orders of the defined types were placed in this encounter.   Labs/procedures  today: growth scan, urine culture  Treatment Plan:  Begin ante testing @ 32wks with growth scan/BPP in 4wks  Reviewed: Preterm labor symptoms and general obstetric precautions including but not limited to vaginal bleeding, contractions, leaking of fluid and fetal movement were reviewed in detail with the patient.  All questions were answered. Has home bp cuff.  Check bp weekly, let us know if >140/90.   Follow-up: Return in about 2 weeks (around 12/14/2019) for HROB ([facility]); schedule out twice weekly NSTs from week 32-36 with growth U/S in 4 weeks.  Orders Placed This Encounter  Procedures  . Culture, OB Urine  . US OB Comp + 14 Wk  . POC Urinalysis Dipstick OB   Arabella Merles  [facility] 11/30/2019 10:04 PM

## 2019-11-30 NOTE — Patient Instructions (Signed)
Charlotte Smith, I greatly value your feedback.  If you receive a survey following your visit with [place] today, we appreciate you taking the time to fill it out.  Thanks, Philipp Deputy, CNM   [facility] ([address] [place], [place] [address]) Entrance C, located off of [address] valet parking  Go to [email or web address] to register for FREE online childbirth classes   Call the office [phone]) or go to [facility] if:  You begin to have strong, frequent contractions  Your water breaks.  Sometimes it is a big gush of fluid, sometimes it is just a trickle that keeps getting your panties wet or running down your legs  You have vaginal bleeding.  It is normal to have a small amount of spotting if your cervix was checked.   You don't feel your baby moving like normal.  If you don't, get you something to eat and drink and lay down and focus on feeling your baby move.  You should feel at least 10 movements in 2 hours.  If you don't, you should call the office or go to [facility].    Tdap Vaccine  It is recommended that you get the Tdap vaccine during the third trimester of EACH pregnancy to help protect your baby from getting pertussis (whooping cough)  27-36 weeks is the BEST time to do this so that you can pass the protection on to your baby. During pregnancy is better than after pregnancy, but if you are unable to get it during pregnancy it will be offered at the hospital.   You can get this vaccine with [place], at the health department, your family doctor, or some local pharmacies  Everyone who will be around your baby should also be up-to-date on their vaccines before the baby comes. Adults (who are not pregnant) only need 1 dose of Tdap during adulthood.   [place] Pediatricians/Family Doctors:  [facility] [phone]            [facility] [phone]                 [facility] [phone]  (usually not accepting new patients unless you have family there already, you are always welcome to call and ask)       [facility] [phone]       [facility] Pediatricians/Family Doctors:   [facility]: [phone]  [facility]: [phone]  [facility]: [phone]  [facility] Doctors:   [facility]: [phone]   [facility]: [phone]  [facility] Doctors:  [facility]: [phone]   Home Blood Pressure Monitoring for Patients   Your provider has recommended that you check your blood pressure (BP) at least once a week at home. If you do not have a blood pressure cuff at home, one will be provided for you. Contact your provider if you have not received your monitor within 1 week.   Helpful Tips for Accurate Home Blood Pressure Checks  . Don't smoke, exercise, or drink caffeine 30 minutes before checking your BP . Use the restroom before checking your BP (a full bladder can raise your pressure) . Relax in a comfortable upright chair . Feet on the ground . Left arm resting comfortably on a flat surface at the level of your heart . Legs uncrossed . Back supported . Sit quietly and don't talk . Place the cuff on your bare arm .  Adjust snuggly, so that only two fingertips can fit between your skin and the top of the cuff . Check 2 readings separated by at least one minute . Keep a log of your BP readings . For a visual, please reference this diagram: http://ccnc.care/bpdiagram  Provider Name: [facility]     Phone: [phone]  Zone 1: ALL CLEAR  Continue to monitor your symptoms:  . BP reading is less than 140 (top number) or less than 90 (bottom number)  . No right upper stomach pain . No headaches or seeing spots . No feeling nauseated or throwing up . No swelling in face and hands  Zone 2: CAUTION Call your doctor's office for  any of the following:  . BP reading is greater than 140 (top number) or greater than 90 (bottom number)  . Stomach pain under your ribs in the middle or right side . Headaches or seeing spots . Feeling nauseated or throwing up . Swelling in face and hands  Zone 3: EMERGENCY  Seek immediate medical care if you have any of the following:  . BP reading is greater than160 (top number) or greater than 110 (bottom number) . Severe headaches not improving with Tylenol . Serious difficulty catching your breath . Any worsening symptoms from Zone 2   Third Trimester of Pregnancy The third trimester is from week 29 through week 42, months 7 through 9. The third trimester is a time when the fetus is growing rapidly. At the end of the ninth month, the fetus is about 20 inches in length and weighs 6-10 pounds.  BODY CHANGES Your body goes through many changes during pregnancy. The changes vary from woman to woman.   Your weight will continue to increase. You can expect to gain 25-35 pounds (11-16 kg) by the end of the pregnancy.  You may begin to get stretch marks on your hips, abdomen, and breasts.  You may urinate more often because the fetus is moving lower into your pelvis and pressing on your bladder.  You may develop or continue to have heartburn as a result of your pregnancy.  You may develop constipation because certain hormones are causing the muscles that push waste through your intestines to slow down.  You may develop hemorrhoids or swollen, bulging veins (varicose veins).  You may have pelvic pain because of the weight gain and pregnancy hormones relaxing your joints between the bones in your pelvis. Backaches may result from overexertion of the muscles supporting your posture.  You may have changes in your hair. These can include thickening of your hair, rapid growth, and changes in texture. Some women also have hair loss during or after pregnancy, or hair that feels dry or thin.  Your hair will most likely return to normal after your baby is born.  Your breasts will continue to grow and be tender. A yellow discharge may leak from your breasts called colostrum.  Your belly button may stick out.  You may feel short of breath because of your expanding uterus.  You may notice the fetus "dropping," or moving lower in your abdomen.  You may have a bloody mucus discharge. This usually occurs a few days to a week before labor begins.  Your cervix becomes thin and soft (effaced) near your due date. WHAT TO EXPECT AT YOUR PRENATAL EXAMS  You will have prenatal exams every 2 weeks until week 36. Then, you will have weekly prenatal exams. During a routine prenatal visit:  You will be  weighed to make sure you and the fetus are growing normally.  Your blood pressure is taken.  Your abdomen will be measured to track your baby's growth.  The fetal heartbeat will be listened to.  Any test results from the previous visit will be discussed.  You may have a cervical check near your due date to see if you have effaced. At around 36 weeks, your caregiver will check your cervix. At the same time, your caregiver will also perform a test on the secretions of the vaginal tissue. This test is to determine if a type of bacteria, Group B streptococcus, is present. Your caregiver will explain this further. Your caregiver may ask you:  What your birth plan is.  How you are feeling.  If you are feeling the baby move.  If you have had any abnormal symptoms, such as leaking fluid, bleeding, severe headaches, or abdominal cramping.  If you have any questions. Other tests or screenings that may be performed during your third trimester include:  Blood tests that check for low iron levels (anemia).  Fetal testing to check the health, activity level, and growth of the fetus. Testing is done if you have certain medical conditions or if there are problems during the pregnancy. FALSE  LABOR You may feel small, irregular contractions that eventually go away. These are called Braxton Hicks contractions, or false labor. Contractions may last for hours, days, or even weeks before true labor sets in. If contractions come at regular intervals, intensify, or become painful, it is best to be seen by your caregiver.  SIGNS OF LABOR   Menstrual-like cramps.  Contractions that are 5 minutes apart or less.  Contractions that start on the top of the uterus and spread down to the lower abdomen and back.  A sense of increased pelvic pressure or back pain.  A watery or bloody mucus discharge that comes from the vagina. If you have any of these signs before the 37th week of pregnancy, call your caregiver right away. You need to go to the hospital to get checked immediately. HOME CARE INSTRUCTIONS   Avoid all smoking, herbs, alcohol, and unprescribed drugs. These chemicals affect the formation and growth of the baby.  Follow your caregiver's instructions regarding medicine use. There are medicines that are either safe or unsafe to take during pregnancy.  Exercise only as directed by your caregiver. Experiencing uterine cramps is a good sign to stop exercising.  Continue to eat regular, healthy meals.  Wear a good support bra for breast tenderness.  Do not use hot tubs, steam rooms, or saunas.  Wear your seat belt at all times when driving.  Avoid raw meat, uncooked cheese, cat litter boxes, and soil used by cats. These carry germs that can cause birth defects in the baby.  Take your prenatal vitamins.  Try taking a stool softener (if your caregiver approves) if you develop constipation. Eat more high-fiber foods, such as fresh vegetables or fruit and whole grains. Drink plenty of fluids to keep your urine clear or pale yellow.  Take warm sitz baths to soothe any pain or discomfort caused by hemorrhoids. Use hemorrhoid cream if your caregiver approves.  If you develop varicose  veins, wear support hose. Elevate your feet for 15 minutes, 3-4 times a day. Limit salt in your diet.  Avoid heavy lifting, wear low heal shoes, and practice good posture.  Rest a lot with your legs elevated if you have leg cramps or low back pain.  Visit your dentist if you have not gone during your pregnancy. Use a soft toothbrush to brush your teeth and be gentle when you floss.  A sexual relationship may be continued unless your caregiver directs you otherwise.  Do not travel far distances unless it is absolutely necessary and only with the approval of your caregiver.  Take prenatal classes to understand, practice, and ask questions about the labor and delivery.  Make a trial run to the hospital.  Pack your hospital bag.  Prepare the baby's nursery.  Continue to go to all your prenatal visits as directed by your caregiver. SEEK MEDICAL CARE IF:  You are unsure if you are in labor or if your water has broken.  You have dizziness.  You have mild pelvic cramps, pelvic pressure, or nagging pain in your abdominal area.  You have persistent nausea, vomiting, or diarrhea.  You have a bad smelling vaginal discharge.  You have pain with urination. SEEK IMMEDIATE MEDICAL CARE IF:   You have a fever.  You are leaking fluid from your vagina.  You have spotting or bleeding from your vagina.  You have severe abdominal cramping or pain.  You have rapid weight loss or gain.  You have shortness of breath with chest pain.  You notice sudden or extreme swelling of your face, hands, ankles, feet, or legs.  You have not felt your baby move in over an hour.  You have severe headaches that do not go away with medicine.  You have vision changes. Document Released: 12/31/2000 Document Revised: 01/11/2013 Document Reviewed: 03/09/2012 [facility] Patient Information 2015 [place], [place]. This information is not intended to replace advice given to you by your health care provider. Make  sure you discuss any questions you have with your health care provider.

## 2019-12-02 LAB — CULTURE, OB URINE

## 2019-12-02 LAB — URINE CULTURE, OB REFLEX

## 2019-12-13 ENCOUNTER — Other Ambulatory Visit: Payer: No Typology Code available for payment source | Admitting: Women's Health

## 2019-12-14 ENCOUNTER — Ambulatory Visit (INDEPENDENT_AMBULATORY_CARE_PROVIDER_SITE_OTHER): Payer: No Typology Code available for payment source

## 2019-12-14 ENCOUNTER — Encounter: Payer: Self-pay | Admitting: Women's Health

## 2019-12-14 ENCOUNTER — Ambulatory Visit (INDEPENDENT_AMBULATORY_CARE_PROVIDER_SITE_OTHER): Payer: No Typology Code available for payment source | Admitting: Women's Health

## 2019-12-14 ENCOUNTER — Other Ambulatory Visit: Payer: Self-pay

## 2019-12-14 VITALS — BP 120/77 | HR 106 | Wt 188.0 lb

## 2019-12-14 DIAGNOSIS — N189 Chronic kidney disease, unspecified: Secondary | ICD-10-CM

## 2019-12-14 DIAGNOSIS — O0990 Supervision of high risk pregnancy, unspecified, unspecified trimester: Secondary | ICD-10-CM

## 2019-12-14 DIAGNOSIS — Z3A32 32 weeks gestation of pregnancy: Secondary | ICD-10-CM | POA: Diagnosis not present

## 2019-12-14 DIAGNOSIS — R0602 Shortness of breath: Secondary | ICD-10-CM

## 2019-12-14 DIAGNOSIS — Z1389 Encounter for screening for other disorder: Secondary | ICD-10-CM

## 2019-12-14 DIAGNOSIS — O0993 Supervision of high risk pregnancy, unspecified, third trimester: Secondary | ICD-10-CM

## 2019-12-14 DIAGNOSIS — Z331 Pregnant state, incidental: Secondary | ICD-10-CM

## 2019-12-14 LAB — POCT URINALYSIS DIPSTICK OB
Blood, UA: 3
Glucose, UA: NEGATIVE
Ketones, UA: NEGATIVE
Leukocytes, UA: NEGATIVE
Nitrite, UA: NEGATIVE

## 2019-12-14 NOTE — Patient Instructions (Signed)
Charlotte Smith, I greatly value your feedback.  If you receive a survey following your visit with us today, we appreciate you taking the time to fill it out.  °Thanks, °Charlotte Smith, CNM, WHNP-BC ° ° [facility] [place] °([address] [place], [place] [address]) °Entrance C, located off of [address] °Free 24/7 valet parking  °Go to [email or web address] to register for FREE online childbirth classes  ° °Call the office ([phone]) or go to [facility] if: °· You begin to have strong, frequent contractions °· Your water breaks.  Sometimes it is a big gush of fluid, sometimes it is just a trickle that keeps getting your panties wet or running down your legs °· You have vaginal bleeding.  It is normal to have a small amount of spotting if your cervix was checked.  °· You don't feel your baby moving like normal.  If you don't, get you something to eat and drink and lay down and focus on feeling your baby move.  You should feel at least 10 movements in 2 hours.  If you don't, you should call the office or go to [facility].   ° °Tdap Vaccine °· It is recommended that you get the Tdap vaccine during the third trimester of EACH pregnancy to help protect your baby from getting pertussis (whooping cough) °· 27-36 weeks is the BEST time to do this so that you can pass the protection on to your baby. During pregnancy is better than after pregnancy, but if you are unable to get it during pregnancy it will be offered at the hospital.  °· You can get this vaccine with us, at the health department, your family doctor, or some local pharmacies °· Everyone who will be around your baby should also be up-to-date on their vaccines before the baby comes. Adults (who are not pregnant) only need 1 dose of Tdap during adulthood.  ° °[place] Pediatricians/Family Doctors: °· [facility] [phone]           °· [facility] [phone]                °· [facility]  [phone] (usually not accepting new patients unless you have family there already, you are always welcome to call and ask)      °· [facility] [phone]      ° °[place] Pediatricians/Family Doctors:  °· [facility]: [phone] °· [facility]: [phone] °· [facility]: [phone] ° °[place] Family Doctors:  °· [facility]: [phone]  °· [facility]: [phone] ° °[place] Family Doctors: °· [facility]: [phone]  ° °Home Blood Pressure Monitoring for Patients  ° °Your provider has recommended that you check your blood pressure (BP) at least once a week at home. If you do not have a blood pressure cuff at home, one will be provided for you. Contact your provider if you have not received your monitor within 1 week.  ° °Helpful Tips for Accurate Home Blood Pressure Checks  °• Don't smoke, exercise, or drink caffeine 30 minutes before checking your BP °• Use the restroom before checking your BP (a full bladder can raise your pressure) °• Relax in a comfortable upright chair °• Feet on the ground °• Left arm resting comfortably on a flat surface at the level of your heart °• Legs uncrossed °• Back supported °• Sit quietly and don't talk °• Place the cuff on your bare   arm . Adjust snuggly, so that only two fingertips can fit between your skin and the top of the cuff . Check 2 readings separated by at least one minute . Keep a log of your BP readings . For a visual, please reference this diagram: http://ccnc.care/bpdiagram  Provider Name: [facility]     Phone: [phone]  Zone 1: ALL CLEAR  Continue to monitor your symptoms:  . BP reading is less than 140 (top number) or less than 90 (bottom number)  . No right upper stomach pain . No headaches or seeing spots . No feeling nauseated or throwing up . No swelling in face and hands  Zone 2: CAUTION Call your  doctor's office for any of the following:  . BP reading is greater than 140 (top number) or greater than 90 (bottom number)  . Stomach pain under your ribs in the middle or right side . Headaches or seeing spots . Feeling nauseated or throwing up . Swelling in face and hands  Zone 3: EMERGENCY  Seek immediate medical care if you have any of the following:  . BP reading is greater than160 (top number) or greater than 110 (bottom number) . Severe headaches not improving with Tylenol . Serious difficulty catching your breath . Any worsening symptoms from Zone 2   Third Trimester of Pregnancy The third trimester is from week 29 through week 42, months 7 through 9. The third trimester is a time when the fetus is growing rapidly. At the end of the ninth month, the fetus is about 20 inches in length and weighs 6-10 pounds.  BODY CHANGES Your body goes through many changes during pregnancy. The changes vary from woman to woman.   Your weight will continue to increase. You can expect to gain 25-35 pounds (11-16 kg) by the end of the pregnancy.  You may begin to get stretch marks on your hips, abdomen, and breasts.  You may urinate more often because the fetus is moving lower into your pelvis and pressing on your bladder.  You may develop or continue to have heartburn as a result of your pregnancy.  You may develop constipation because certain hormones are causing the muscles that push waste through your intestines to slow down.  You may develop hemorrhoids or swollen, bulging veins (varicose veins).  You may have pelvic pain because of the weight gain and pregnancy hormones relaxing your joints between the bones in your pelvis. Backaches may result from overexertion of the muscles supporting your posture.  You may have changes in your hair. These can include thickening of your hair, rapid growth, and changes in texture. Some women also have hair loss during or after pregnancy, or hair that  feels dry or thin. Your hair will most likely return to normal after your baby is born.  Your breasts will continue to grow and be tender. A yellow discharge may leak from your breasts called colostrum.  Your belly button may stick out.  You may feel short of breath because of your expanding uterus.  You may notice the fetus "dropping," or moving lower in your abdomen.  You may have a bloody mucus discharge. This usually occurs a few days to a week before labor begins.  Your cervix becomes thin and soft (effaced) near your due date. WHAT TO EXPECT AT YOUR PRENATAL EXAMS  You will have prenatal exams every 2 weeks until week 36. Then, you will have weekly prenatal exams. During a routine prenatal visit:  You  will be weighed to make sure you and the fetus are growing normally.  Your blood pressure is taken.  Your abdomen will be measured to track your baby's growth.  The fetal heartbeat will be listened to.  Any test results from the previous visit will be discussed.  You may have a cervical check near your due date to see if you have effaced. At around 36 weeks, your caregiver will check your cervix. At the same time, your caregiver will also perform a test on the secretions of the vaginal tissue. This test is to determine if a type of bacteria, Group B streptococcus, is present. Your caregiver will explain this further. Your caregiver may ask you:  What your birth plan is.  How you are feeling.  If you are feeling the baby move.  If you have had any abnormal symptoms, such as leaking fluid, bleeding, severe headaches, or abdominal cramping.  If you have any questions. Other tests or screenings that may be performed during your third trimester include:  Blood tests that check for low iron levels (anemia).  Fetal testing to check the health, activity level, and growth of the fetus. Testing is done if you have certain medical conditions or if there are problems during the  pregnancy. FALSE LABOR You may feel small, irregular contractions that eventually go away. These are called Braxton Hicks contractions, or false labor. Contractions may last for hours, days, or even weeks before true labor sets in. If contractions come at regular intervals, intensify, or become painful, it is best to be seen by your caregiver.  SIGNS OF LABOR   Menstrual-like cramps.  Contractions that are 5 minutes apart or less.  Contractions that start on the top of the uterus and spread down to the lower abdomen and back.  A sense of increased pelvic pressure or back pain.  A watery or bloody mucus discharge that comes from the vagina. If you have any of these signs before the 37th week of pregnancy, call your caregiver right away. You need to go to the hospital to get checked immediately. HOME CARE INSTRUCTIONS   Avoid all smoking, herbs, alcohol, and unprescribed drugs. These chemicals affect the formation and growth of the baby.  Follow your caregiver's instructions regarding medicine use. There are medicines that are either safe or unsafe to take during pregnancy.  Exercise only as directed by your caregiver. Experiencing uterine cramps is a good sign to stop exercising.  Continue to eat regular, healthy meals.  Wear a good support bra for breast tenderness.  Do not use hot tubs, steam rooms, or saunas.  Wear your seat belt at all times when driving.  Avoid raw meat, uncooked cheese, cat litter boxes, and soil used by cats. These carry germs that can cause birth defects in the baby.  Take your prenatal vitamins.  Try taking a stool softener (if your caregiver approves) if you develop constipation. Eat more high-fiber foods, such as fresh vegetables or fruit and whole grains. Drink plenty of fluids to keep your urine clear or pale yellow.  Take warm sitz baths to soothe any pain or discomfort caused by hemorrhoids. Use hemorrhoid cream if your caregiver approves.  If you  develop varicose veins, wear support hose. Elevate your feet for 15 minutes, 3-4 times a day. Limit salt in your diet.  Avoid heavy lifting, wear low heal shoes, and practice good posture.  Rest a lot with your legs elevated if you have leg cramps or low back  pain.  Visit your dentist if you have not gone during your pregnancy. Use a soft toothbrush to brush your teeth and be gentle when you floss.  A sexual relationship may be continued unless your caregiver directs you otherwise.  Do not travel far distances unless it is absolutely necessary and only with the approval of your caregiver.  Take prenatal classes to understand, practice, and ask questions about the labor and delivery.  Make a trial run to the hospital.  Pack your hospital bag.  Prepare the baby's nursery.  Continue to go to all your prenatal visits as directed by your caregiver. SEEK MEDICAL CARE IF:  You are unsure if you are in labor or if your water has broken.  You have dizziness.  You have mild pelvic cramps, pelvic pressure, or nagging pain in your abdominal area.  You have persistent nausea, vomiting, or diarrhea.  You have a bad smelling vaginal discharge.  You have pain with urination. SEEK IMMEDIATE MEDICAL CARE IF:   You have a fever.  You are leaking fluid from your vagina.  You have spotting or bleeding from your vagina.  You have severe abdominal cramping or pain.  You have rapid weight loss or gain.  You have shortness of breath with chest pain.  You notice sudden or extreme swelling of your face, hands, ankles, feet, or legs.  You have not felt your baby move in over an hour.  You have severe headaches that do not go away with medicine.  You have vision changes. Document Released: 12/31/2000 Document Revised: 01/11/2013 Document Reviewed: 03/09/2012 [facility] Patient Information 2015 [place], [place]. This information is not intended to replace advice given to you by your health  care provider. Make sure you discuss any questions you have with your health care provider.

## 2019-12-14 NOTE — Progress Notes (Signed)
HIGH-RISK PREGNANCY VISIT Patient name: Charlotte Smith MRN [id number]  Date of birth: 07-Jul-1995 Chief Complaint:   Routine Prenatal Visit (u/s today)  History of Present Illness:   Charlotte Smith is a 24 y.o. G48P1001 female at [redacted]w[redacted]d with an Estimated Date of Delivery: 02/07/20 being seen today for ongoing management of a high-risk pregnancy complicated by chronic renal disease.  Today she reports shortness of breath x , no chest pain. Some reflux.  Depression screen [facility] 2/9 11/02/2019 08/01/2019 08/01/2019  Decreased Interest 0 0 0  Down, Depressed, Hopeless 0 0 0  PHQ - 2 Score 0 0 0  Altered sleeping 0 0 0  Tired, decreased energy 0 2 2  Change in appetite 0 2 2  Feeling bad or failure about yourself  0 0 0  Trouble concentrating 0 0 0  Moving slowly or fidgety/restless 0 0 0  Suicidal thoughts 0 0 0  PHQ-9 Score 0 4 4    Contractions: Irregular.  .  Movement: Present. denies leaking of fluid.  Review of Systems:   Pertinent items are noted in HPI Denies abnormal vaginal discharge w/ itching/odor/irritation, headaches, visual changes, shortness of breath, chest pain, abdominal pain, severe nausea/vomiting, or problems with urination or bowel movements unless otherwise stated above. Pertinent History Reviewed:  Reviewed past medical,surgical, social, obstetrical and family history.  Reviewed problem list, medications and allergies. Physical Assessment:   Vitals:   12/14/19 1552  BP: 120/77  Pulse: (!) 106  Weight: 188 lb (85.3 kg)  Body mass index is 30.34 kg/m.  Pulse ox 99% RA           Physical Examination:   General appearance: alert, well appearing, and in no distress  Mental status: alert, oriented to person, place, and time  Skin: warm & dry   Extremities: Edema: None    Cardiovascular: normal heart rate noted  Respiratory: normal respiratory effort, no distress, LCTAB  Abdomen: gravid, soft, non-tender  Pelvic: Cervical exam deferred         Fetal  Status: Fetal Heart Rate (bpm): 157 u/s   Movement: Present    Fetal Surveillance Testing today: [place] 32+1 wks,cephalic,posterior placenta gr 1,fhr 157 bpm,AFI 12.3 cm,BPP 8/8  Chaperone: N/A    Results for orders placed or performed in visit on 12/14/19 (from the past 24 hour(s))  POC Urinalysis Dipstick OB   Collection Time: 12/14/19  4:00 PM  Result Value Ref Range   Color, UA     Clarity, UA     Glucose, UA Negative Negative   Bilirubin, UA     Ketones, UA neg    Spec Grav, UA     Blood, UA 3    pH, UA     POC,PROTEIN,UA Small (1+) Negative, Trace, Small (1+), Moderate (2+), Large (3+), 4+   Urobilinogen, UA     Nitrite, UA neg    Leukocytes, UA Negative Negative   Appearance     Odor      Assessment & Plan:  High-risk pregnancy: G2P1001 at [redacted]w[redacted]d with an Estimated Date of Delivery: 02/07/20   2) Chronic renal disease, Rt kidney deteriorated d/t VUR, serum Cr 1.21 (Jan 2020), 1.07 (9/17) then 0.80 (10/13), chronic proteinuria (baseline P:C 0.848) and hematuria  3) Frequent uti's w/ pyelo in Sept> past 2 urine cx neg, asymptomatic today, on keflex suppression  4) SOB> pulse ox 99%RA, LCTAB, low suspicion. Will check CBC. If worsens, develops CP, go to hosp  Meds: No orders of the defined  types were placed in this encounter.  Labs/procedures today: u/s  Treatment Plan:  EFW q4wks    2x/wk testing @ 32wks or weekly BPP    Deliver @ 37wks  Reviewed: Preterm labor symptoms and general obstetric precautions including but not limited to vaginal bleeding, contractions, leaking of fluid and fetal movement were reviewed in detail with the patient.  All questions were answered.   Follow-up: Return for As scheduled.   Future Appointments  Date Time Provider Department Center  12/19/2019 11:30 AM Lazaro Arms, MD [facility]  12/22/2019  3:30 PM [facility] NURSE [facility]  12/27/2019  3:30 PM [facility]UR[facility]  12/30/2019  8:30 AM [facility] [place]  [facility] None  12/30/2019  9:50 AM Lazaro Arms, MD [facility]  01/03/2020  3:30 PM Cheral Marker, CNM [facility]  01/06/2020 11:50 AM [facility] NURSE [facility]  01/10/2020  3:30 PM Cheral Marker, CNM [facility]    Orders Placed This Encounter  Procedures  . CBC  . POC Urinalysis Dipstick OB   Cheral Marker CNM, [facility] 12/14/2019 4:47 PM

## 2019-12-14 NOTE — Progress Notes (Signed)
[place] 32+1 wks,cephalic,posterior placenta gr 1,fhr 157 bpm,AFI 12.3 cm,BPP 8/8

## 2019-12-19 ENCOUNTER — Encounter: Payer: Self-pay | Admitting: Obstetrics & Gynecology

## 2019-12-19 ENCOUNTER — Ambulatory Visit (INDEPENDENT_AMBULATORY_CARE_PROVIDER_SITE_OTHER): Payer: No Typology Code available for payment source | Admitting: Obstetrics & Gynecology

## 2019-12-19 ENCOUNTER — Other Ambulatory Visit: Payer: Self-pay

## 2019-12-19 ENCOUNTER — Encounter: Payer: No Typology Code available for payment source | Admitting: Obstetrics & Gynecology

## 2019-12-19 VITALS — BP 133/81 | HR 120 | Wt 187.0 lb

## 2019-12-19 DIAGNOSIS — N189 Chronic kidney disease, unspecified: Secondary | ICD-10-CM

## 2019-12-19 DIAGNOSIS — Z1389 Encounter for screening for other disorder: Secondary | ICD-10-CM

## 2019-12-19 DIAGNOSIS — Z331 Pregnant state, incidental: Secondary | ICD-10-CM

## 2019-12-19 DIAGNOSIS — Z3A32 32 weeks gestation of pregnancy: Secondary | ICD-10-CM

## 2019-12-19 DIAGNOSIS — O0990 Supervision of high risk pregnancy, unspecified, unspecified trimester: Secondary | ICD-10-CM

## 2019-12-19 LAB — POCT URINALYSIS DIPSTICK OB
Glucose, UA: NEGATIVE
Ketones, UA: NEGATIVE
Leukocytes, UA: NEGATIVE
Nitrite, UA: NEGATIVE

## 2019-12-19 NOTE — Progress Notes (Signed)
HIGH-RISK PREGNANCY VISIT Patient name: Charlotte Smith MRN [id number]  Date of birth: Jan 15, 1996 Chief Complaint:   Routine Prenatal Visit  History of Present Illness:   Charlotte Smith is a 24 y.o. G32P1001 female at [redacted]w[redacted]d with an Estimated Date of Delivery: 02/07/20 being seen today for ongoing management of a high-risk pregnancy complicated by CKD.  Today she reports no complaints.  Depression screen [facility] 2/9 11/02/2019 08/01/2019 08/01/2019  Decreased Interest 0 0 0  Down, Depressed, Hopeless 0 0 0  PHQ - 2 Score 0 0 0  Altered sleeping 0 0 0  Tired, decreased energy 0 2 2  Change in appetite 0 2 2  Feeling bad or failure about yourself  0 0 0  Trouble concentrating 0 0 0  Moving slowly or fidgety/restless 0 0 0  Suicidal thoughts 0 0 0  PHQ-9 Score 0 4 4    Contractions: Irregular. Vag. Bleeding: None.  Movement: Present. denies leaking of fluid.  Review of Systems:   Pertinent items are noted in HPI Denies abnormal vaginal discharge w/ itching/odor/irritation, headaches, visual changes, shortness of breath, chest pain, abdominal pain, severe nausea/vomiting, or problems with urination or bowel movements unless otherwise stated above. Pertinent History Reviewed:  Reviewed past medical,surgical, social, obstetrical and family history.  Reviewed problem list, medications and allergies. Physical Assessment:   Vitals:   12/19/19 1133  BP: 133/81  Pulse: (!) 120  Weight: 187 lb (84.8 kg)  Body mass index is 30.18 kg/m.           Physical Examination:   General appearance: alert, well appearing, and in no distress  Mental status: alert, oriented to person, place, and time  Skin: warm & dry   Extremities: Edema: None    Cardiovascular: normal heart rate noted  Respiratory: normal respiratory effort, no distress  Abdomen: gravid, soft, non-tender  Pelvic: Cervical exam deferred         Fetal Status: Fetal Heart Rate (bpm): 155 Fundal Height: 33 cm Movement: Present     Fetal Surveillance Testing today: BPP last week was good8/8   Chaperone: N/A    Results for orders placed or performed in visit on 12/19/19 (from the past 24 hour(s))  POC Urinalysis Dipstick OB   Collection Time: 12/19/19 11:32 AM  Result Value Ref Range   Color, UA     Clarity, UA     Glucose, UA Negative Negative   Bilirubin, UA     Ketones, UA neg    Spec Grav, UA     Blood, UA large    pH, UA     POC,PROTEIN,UA Large (3+) Negative, Trace, Small (1+), Moderate (2+), Large (3+), 4+   Urobilinogen, UA     Nitrite, UA neg    Leukocytes, UA Negative Negative   Appearance     Odor      Assessment & Plan:  High-risk pregnancy: G2P1001 at [redacted]w[redacted]d with an Estimated Date of Delivery: 02/07/20   1) CKD, stable, on keflex 500 qhs  2) scheduled repeat C section at 37 weeks + bilateral salpingectomy, stable  Meds: No orders of the defined types were placed in this encounter.   Labs/procedures today:   Treatment Plan:  Weekly BPP, repeat section + bilateral salpingectomy   Reviewed: Preterm labor symptoms and general obstetric precautions including but not limited to vaginal bleeding, contractions, leaking of fluid and fetal movement were reviewed in detail with the patient.  All questions were answered.  home bp cuff. Rx faxed to .  Check bp weekly, let us know if >140/90.   Follow-up: Return in about 2 days (around 12/21/2019) for BPP/sono 12/1 or 12/2 is ok(only) no provider visit is needed for this viit.   Future Appointments  Date Time Provider Department Center  12/22/2019  3:30 PM [facility] NURSE [facility]  12/27/2019  3:30 PM [facility]  12/30/2019  8:30 AM [facility] [place] [facility] None  12/30/2019  9:50 AM Lazaro Arms, MD [facility]  01/03/2020  3:30 PM Cheral Marker, CNM [facility]  01/06/2020 11:50 AM [facility] NURSE [facility]  01/10/2020  3:30 PM Cheral Marker, CNM [facility]    Orders Placed This  Encounter  Procedures  . POC Urinalysis Dipstick OB   Lazaro Arms  12/19/2019 12:04 PM

## 2019-12-20 ENCOUNTER — Other Ambulatory Visit: Payer: Self-pay | Admitting: Women's Health

## 2019-12-20 ENCOUNTER — Other Ambulatory Visit: Payer: No Typology Code available for payment source | Admitting: Obstetrics & Gynecology

## 2019-12-20 ENCOUNTER — Other Ambulatory Visit: Payer: No Typology Code available for payment source

## 2019-12-21 ENCOUNTER — Other Ambulatory Visit: Payer: Self-pay | Admitting: Obstetrics & Gynecology

## 2019-12-21 DIAGNOSIS — N189 Chronic kidney disease, unspecified: Secondary | ICD-10-CM

## 2019-12-22 ENCOUNTER — Ambulatory Visit (INDEPENDENT_AMBULATORY_CARE_PROVIDER_SITE_OTHER): Payer: No Typology Code available for payment source

## 2019-12-22 ENCOUNTER — Other Ambulatory Visit: Payer: No Typology Code available for payment source

## 2019-12-22 ENCOUNTER — Other Ambulatory Visit: Payer: Self-pay

## 2019-12-22 DIAGNOSIS — N189 Chronic kidney disease, unspecified: Secondary | ICD-10-CM | POA: Diagnosis not present

## 2019-12-22 DIAGNOSIS — Z3A33 33 weeks gestation of pregnancy: Secondary | ICD-10-CM | POA: Diagnosis not present

## 2019-12-22 NOTE — Progress Notes (Signed)
[place] 33+2 wks,cephalic,fhr 148 bpm,posterior placenta gr 2,afi 12.6 cm,BPP 8/8

## 2019-12-23 ENCOUNTER — Ambulatory Visit (INDEPENDENT_AMBULATORY_CARE_PROVIDER_SITE_OTHER): Payer: No Typology Code available for payment source | Admitting: *Deleted

## 2019-12-23 ENCOUNTER — Encounter: Payer: Self-pay | Admitting: *Deleted

## 2019-12-23 VITALS — BP 119/81 | HR 116 | Ht 66.0 in | Wt 187.5 lb

## 2019-12-23 DIAGNOSIS — Z3A33 33 weeks gestation of pregnancy: Secondary | ICD-10-CM

## 2019-12-23 DIAGNOSIS — Z0130 Encounter for examination of blood pressure without abnormal findings: Secondary | ICD-10-CM

## 2019-12-23 DIAGNOSIS — Z1389 Encounter for screening for other disorder: Secondary | ICD-10-CM

## 2019-12-23 DIAGNOSIS — R319 Hematuria, unspecified: Secondary | ICD-10-CM

## 2019-12-23 DIAGNOSIS — Z331 Pregnant state, incidental: Secondary | ICD-10-CM

## 2019-12-23 LAB — POCT URINALYSIS DIPSTICK OB
Glucose, UA: NEGATIVE
Ketones, UA: NEGATIVE
Leukocytes, UA: NEGATIVE
Nitrite, UA: NEGATIVE

## 2019-12-23 NOTE — Progress Notes (Addendum)
   NURSE VISIT- BLOOD PRESSURE CHECK  SUBJECTIVE:  Charlotte Smith is a 24 y.o. G29P1001 female here for BP check. She is [redacted]w[redacted]d pregnant    HYPERTENSION ROS:  Pregnant/postpartum:  . Severe headaches that don't go away with tylenol/other medicines: No  . Visual changes (seeing spots/double/blurred vision) Yes  . Severe pain under right breast breast or in center of upper chest Yes . Severe nausea/vomiting No  . Taking medicines as instructed not applicable   OBJECTIVE:  BP 119/81   Pulse (!) 116   Ht 5\' 6"  (1.676 m)   Wt 187 lb 8 oz (85 kg)   LMP 05/03/2019 (Approximate)   Breastfeeding No   BMI 30.26 kg/m   Appearance alert, well appearing, and in no distress.  ASSESSMENT: Pregnancy [redacted]w[redacted]d  blood pressure check  PLAN: Discussed with [redacted]w[redacted]d, CNM   Recommendations: no changes needed   Follow-up: as scheduled   Philipp Deputy  12/23/2019 9:58 AM  Chart reviewed for nurse visit. Agree with plan of care. Urine to culture. 14/03/2019, CNM 12/23/2019 12:18 PM

## 2019-12-25 LAB — URINE CULTURE

## 2019-12-26 ENCOUNTER — Other Ambulatory Visit: Payer: No Typology Code available for payment source

## 2019-12-27 ENCOUNTER — Inpatient Hospital Stay (HOSPITAL_COMMUNITY)
Admission: AD | Admit: 2019-12-27 | Discharge: 2019-12-27 | Disposition: A | Discharge status: Home / Self Care | Payer: No Typology Code available for payment source | Attending: Obstetrics and Gynecology | Admitting: Obstetrics and Gynecology

## 2019-12-27 ENCOUNTER — Telehealth: Payer: Self-pay | Admitting: Radiology

## 2019-12-27 ENCOUNTER — Ambulatory Visit (INDEPENDENT_AMBULATORY_CARE_PROVIDER_SITE_OTHER): Payer: No Typology Code available for payment source

## 2019-12-27 ENCOUNTER — Other Ambulatory Visit: Payer: No Typology Code available for payment source

## 2019-12-27 ENCOUNTER — Ambulatory Visit (INDEPENDENT_AMBULATORY_CARE_PROVIDER_SITE_OTHER): Payer: No Typology Code available for payment source | Admitting: *Deleted

## 2019-12-27 ENCOUNTER — Other Ambulatory Visit: Payer: Self-pay | Admitting: Physician Assistant

## 2019-12-27 ENCOUNTER — Encounter: Payer: Self-pay | Admitting: *Deleted

## 2019-12-27 ENCOUNTER — Encounter (HOSPITAL_COMMUNITY): Payer: Self-pay | Admitting: Obstetrics and Gynecology

## 2019-12-27 ENCOUNTER — Other Ambulatory Visit: Payer: Self-pay

## 2019-12-27 ENCOUNTER — Other Ambulatory Visit: Payer: No Typology Code available for payment source | Admitting: Obstetrics & Gynecology

## 2019-12-27 VITALS — BP 130/87 | HR 142 | Ht 66.0 in | Wt 187.5 lb

## 2019-12-27 DIAGNOSIS — Z3A34 34 weeks gestation of pregnancy: Secondary | ICD-10-CM | POA: Insufficient documentation

## 2019-12-27 DIAGNOSIS — Z1389 Encounter for screening for other disorder: Secondary | ICD-10-CM

## 2019-12-27 DIAGNOSIS — R002 Palpitations: Secondary | ICD-10-CM

## 2019-12-27 DIAGNOSIS — R000 Tachycardia, unspecified: Secondary | ICD-10-CM

## 2019-12-27 DIAGNOSIS — Z0130 Encounter for examination of blood pressure without abnormal findings: Secondary | ICD-10-CM

## 2019-12-27 DIAGNOSIS — R55 Syncope and collapse: Secondary | ICD-10-CM

## 2019-12-27 DIAGNOSIS — Z3689 Encounter for other specified antenatal screening: Secondary | ICD-10-CM

## 2019-12-27 DIAGNOSIS — Z801 Family history of malignant neoplasm of trachea, bronchus and lung: Secondary | ICD-10-CM | POA: Insufficient documentation

## 2019-12-27 DIAGNOSIS — R0602 Shortness of breath: Secondary | ICD-10-CM | POA: Diagnosis not present

## 2019-12-27 DIAGNOSIS — O99891 Other specified diseases and conditions complicating pregnancy: Secondary | ICD-10-CM | POA: Diagnosis not present

## 2019-12-27 DIAGNOSIS — Z8249 Family history of ischemic heart disease and other diseases of the circulatory system: Secondary | ICD-10-CM | POA: Diagnosis not present

## 2019-12-27 DIAGNOSIS — Z79899 Other long term (current) drug therapy: Secondary | ICD-10-CM | POA: Diagnosis not present

## 2019-12-27 DIAGNOSIS — O26893 Other specified pregnancy related conditions, third trimester: Secondary | ICD-10-CM | POA: Diagnosis not present

## 2019-12-27 DIAGNOSIS — N189 Chronic kidney disease, unspecified: Secondary | ICD-10-CM | POA: Diagnosis not present

## 2019-12-27 DIAGNOSIS — Z331 Pregnant state, incidental: Secondary | ICD-10-CM

## 2019-12-27 LAB — URINALYSIS, ROUTINE W REFLEX MICROSCOPIC
Bilirubin Urine: NEGATIVE
Glucose, UA: NEGATIVE mg/dL
Ketones, ur: NEGATIVE mg/dL
Leukocytes,Ua: NEGATIVE
Nitrite: NEGATIVE
Protein, ur: 100 mg/dL — AB
Specific Gravity, Urine: 1.013 (ref 1.005–1.030)
pH: 6 (ref 5.0–8.0)

## 2019-12-27 LAB — CBC
HCT: 32 % — ABNORMAL LOW (ref 36.0–46.0)
Hemoglobin: 11 g/dL — ABNORMAL LOW (ref 12.0–15.0)
MCH: 28.5 pg (ref 26.0–34.0)
MCHC: 34.4 g/dL (ref 30.0–36.0)
MCV: 82.9 fL (ref 80.0–100.0)
Platelets: 251 10*3/uL (ref 150–400)
RBC: 3.86 MIL/uL — ABNORMAL LOW (ref 3.87–5.11)
RDW: 13 % (ref 11.5–15.5)
WBC: 11.8 10*3/uL — ABNORMAL HIGH (ref 4.0–10.5)
nRBC: 0 % (ref 0.0–0.2)

## 2019-12-27 LAB — POCT URINALYSIS DIPSTICK OB
Glucose, UA: NEGATIVE
Ketones, UA: NEGATIVE
Nitrite, UA: NEGATIVE

## 2019-12-27 LAB — COMPREHENSIVE METABOLIC PANEL
ALT: 13 U/L (ref 0–44)
AST: 15 U/L (ref 15–41)
Albumin: 2.6 g/dL — ABNORMAL LOW (ref 3.5–5.0)
Alkaline Phosphatase: 96 U/L (ref 38–126)
Anion gap: 11 (ref 5–15)
BUN: 10 mg/dL (ref 6–20)
CO2: 22 mmol/L (ref 22–32)
Calcium: 9.3 mg/dL (ref 8.9–10.3)
Chloride: 101 mmol/L (ref 98–111)
Creatinine, Ser: 1.01 mg/dL — ABNORMAL HIGH (ref 0.44–1.00)
GFR, Estimated: >60 mL/min (ref >60)
Glucose, Bld: 91 mg/dL (ref 70–99)
Potassium: 4.6 mmol/L (ref 3.5–5.1)
Sodium: 134 mmol/L — ABNORMAL LOW (ref 135–145)
Total Bilirubin: 0.5 mg/dL (ref 0.3–1.2)
Total Protein: 6.1 g/dL — ABNORMAL LOW (ref 6.5–8.1)

## 2019-12-27 LAB — TSH: TSH: 0.501 u[IU]/mL (ref 0.350–4.500)

## 2019-12-27 LAB — PROTEIN / CREATININE RATIO, URINE
Creatinine, Urine: 119.32 mg/dL
Protein Creatinine Ratio: 0.99 mg/mg{Cre} — ABNORMAL HIGH (ref 0.00–0.15)
Total Protein, Urine: 118 mg/dL

## 2019-12-27 LAB — BRAIN NATRIURETIC PEPTIDE: B Natriuretic Peptide: 63.8 pg/mL (ref 0.0–100.0)

## 2019-12-27 LAB — TROPONIN I (HIGH SENSITIVITY): Troponin I (High Sensitivity): 4 ng/L (ref <18)

## 2019-12-27 NOTE — MAU Provider Note (Signed)
History     CSN: [id number]  Arrival date and time: 12/27/19 1050   First Provider Initiated Contact with Patient 12/27/19 1136      Chief Complaint  Patient presents with  . Shortness of Breath  . Tachycardia   [name] is a 24 y.o. G2P1001 at 4181w0d who presents to MAU for SOB and tachycardia. Patient reports she was experiencing tachycardia and SOB and feeling like her heart was pounding out of her chest, so she called her OB's office for an appointment and was sent to MAU for evaluation. Patient reports symptoms are absent now that she is in the MAU and she only experienced the symptoms for about 30 minutes. Patient reports she had one additional episode of the same this past Friday for the same duration. Patient denies syncope or near syncopal episode or feeling like her heart is skipping a beat during these episodes. Patient has been pregnant in the past but did not have this issue in a previous pregnancy. Patient has not experienced these episodes outside of pregnancy either. Patient denies cardiac issues, but reports history of chronic renal disease with a deteriorated right kidney since a young age. Patient denies any pattern to the start of symptoms.  Pt denies VB, LOF, ctx, decreased FM, vaginal discharge/odor/itching. Pt denies N/V, abdominal pain, constipation, diarrhea, or urinary problems. Pt denies fever, chills, fatigue, sweating or changes in appetite. Pt denies SOB or chest pain. Pt denies dizziness, HA, light-headedness, weakness.   OB History    Gravida  2   Para  1   Term  1   Preterm      AB  0   Living  1     SAB  0   TAB      Ectopic      Multiple  0   Live Births  1           Past Medical History:  Diagnosis Date  . Renal disorder    non-functioning left kidney    Past Surgical History:  Procedure Laterality Date  . BLADDER SURGERY    . CESAREAN SECTION N/A 10/30/2014   Procedure: CESAREAN SECTION;  Surgeon: Lazaro ArmsLuther H  Eure, MD;  Location: [facility];  Service: Obstetrics;  Laterality: N/A;    Family History  Problem Relation Age of Onset  . Depression Mother   . Hypertension Father   . Cancer Maternal Grandmother        lung  . Cancer Paternal Grandfather        testicular, lung    Social History   Tobacco Use  . Smoking status: Never Smoker  . Smokeless tobacco: Never Used  Vaping Use  . Vaping Use: Never used  Substance Use Topics  . Alcohol use: No  . Drug use: No    Allergies: No Known Allergies  Medications Prior to Admission  Medication Sig Dispense Refill Last Dose  . Cephalexin (KEFLEX PO) Take by mouth daily.   12/26/2019 at Unknown time  . omeprazole (PRILOSEC) 10 MG capsule Take 1 bid 60 capsule 3 12/26/2019 at Unknown time  . Pediatric Multivitamins-Iron (FLINTSTONES COMPLETE PO) Take 2 tablets by mouth.   12/27/2019 at Unknown time    Review of Systems  Constitutional: Negative for chills, diaphoresis, fatigue and fever.  Eyes: Negative for visual disturbance.  Respiratory: Positive for shortness of breath.   Cardiovascular: Positive for palpitations. Negative for chest pain.  Gastrointestinal: Negative for abdominal pain, constipation, diarrhea, nausea and vomiting.  Genitourinary: Negative for dysuria, flank pain, frequency, pelvic pain, urgency, vaginal bleeding and vaginal discharge.  Neurological: Negative for dizziness, weakness, light-headedness and headaches.   Physical Exam   Blood pressure 115/73, pulse 83, temperature 97.7 F (36.5 C), resp. rate 19, weight 85.4 kg, last menstrual period 05/03/2019, SpO2 99 %.  Patient Vitals for the past 24 hrs:  BP Temp Pulse Resp SpO2 Weight  12/27/19 1429 -- -- -- -- 99 % --  12/27/19 1424 -- -- -- -- 99 % --  12/27/19 1419 -- -- -- -- 100 % --  12/27/19 1414 -- -- -- -- 98 % --  12/27/19 1404 -- -- -- -- 98 % --  12/27/19 1359 -- -- -- -- 98 % --  12/27/19 1354 -- -- -- -- 98 % --  12/27/19 1349 -- -- -- -- 99 % --   12/27/19 1344 -- -- -- -- 99 % --  12/27/19 1339 -- -- -- -- 97 % --  12/27/19 1334 -- -- -- -- 99 % --  12/27/19 1329 -- -- -- -- 100 % --  12/27/19 1324 -- -- -- -- 100 % --  12/27/19 1319 -- -- -- -- 99 % --  12/27/19 1315 115/73 -- 83 -- -- --  12/27/19 1314 -- -- -- -- 98 % --  12/27/19 1309 -- -- -- -- 98 % --  12/27/19 1304 -- -- -- -- 98 % --  12/27/19 1259 -- -- -- -- 99 % --  12/27/19 1254 -- -- -- -- 98 % --  12/27/19 1249 -- -- -- -- 97 % --  12/27/19 1244 -- -- -- -- 98 % --  12/27/19 1239 -- -- -- -- 97 % --  12/27/19 1234 -- -- -- -- 98 % --  12/27/19 1229 -- -- -- -- 98 % --  12/27/19 1224 -- -- -- -- 98 % --  12/27/19 1219 -- -- -- -- 97 % --  12/27/19 1214 -- -- -- -- 98 % --  12/27/19 1209 -- -- -- -- 98 % --  12/27/19 1204 -- -- -- -- 98 % --  12/27/19 1159 -- -- -- -- 98 % --  12/27/19 1154 -- -- -- -- 96 % --  12/27/19 1149 -- -- -- -- 95 % --  12/27/19 1144 -- -- -- -- 98 % --  12/27/19 1139 -- -- -- -- 95 % --  12/27/19 1134 -- -- -- -- 95 % --  12/27/19 1129 114/79 -- 100 -- 96 % --  12/27/19 1124 -- -- -- -- 97 % --  12/27/19 1102 114/73 97.7 F (36.5 C) (!) 118 19 98 % 85.4 kg   Physical Exam Constitutional:      General: She is not in acute distress.    Appearance: Normal appearance. She is normal weight. She is not ill-appearing, toxic-appearing or diaphoretic.  HENT:     Head: Normocephalic and atraumatic.  Cardiovascular:     Rate and Rhythm: Normal rate and regular rhythm.     Heart sounds: Normal heart sounds.  Pulmonary:     Effort: Pulmonary effort is normal.     Breath sounds: Normal breath sounds.  Neurological:     Mental Status: She is alert and oriented to person, place, and time.  Psychiatric:        Mood and Affect: Mood normal.        Behavior: Behavior normal.        Thought  Content: Thought content normal.        Judgment: Judgment normal.    Results for orders placed or performed during the hospital encounter of  12/27/19 (from the past 24 hour(s))  Urinalysis, Routine w reflex microscopic Urine, Clean Catch     Status: Abnormal   Collection Time: 12/27/19 11:27 AM  Result Value Ref Range   Color, Urine YELLOW YELLOW   APPearance HAZY (A) CLEAR   Specific Gravity, Urine 1.013 1.005 - 1.030   pH 6.0 5.0 - 8.0   Glucose, UA NEGATIVE NEGATIVE mg/dL   Hgb urine dipstick MODERATE (A) NEGATIVE   Bilirubin Urine NEGATIVE NEGATIVE   Ketones, ur NEGATIVE NEGATIVE mg/dL   Protein, ur 161 (A) NEGATIVE mg/dL   Nitrite NEGATIVE NEGATIVE   Leukocytes,Ua NEGATIVE NEGATIVE   RBC / HPF 0-5 0 - 5 RBC/hpf   WBC, UA 0-5 0 - 5 WBC/hpf   Bacteria, UA RARE (A) NONE SEEN   Squamous Epithelial / LPF 6-10 0 - 5   Mucus PRESENT   Protein / creatinine ratio, urine     Status: Abnormal   Collection Time: 12/27/19 12:03 PM  Result Value Ref Range   Creatinine, Urine 119.32 mg/dL   Total Protein, Urine 118 mg/dL   Protein Creatinine Ratio 0.99 (H) 0.00 - 0.15 mg/mg[Cre]  CBC     Status: Abnormal   Collection Time: 12/27/19 12:10 PM  Result Value Ref Range   WBC 11.8 (H) 4.0 - 10.5 K/uL   RBC 3.86 (L) 3.87 - 5.11 MIL/uL   Hemoglobin 11.0 (L) 12.0 - 15.0 g/dL   HCT 09.6 (L) 36 - 46 %   MCV 82.9 80.0 - 100.0 fL   MCH 28.5 26.0 - 34.0 pg   MCHC 34.4 30.0 - 36.0 g/dL   RDW 04.5 40.9 - 81.1 %   Platelets 251 150 - 400 K/uL   nRBC 0.0 0.0 - 0.2 %  Comprehensive metabolic panel     Status: Abnormal   Collection Time: 12/27/19 12:10 PM  Result Value Ref Range   Sodium 134 (L) 135 - 145 mmol/L   Potassium 4.6 3.5 - 5.1 mmol/L   Chloride 101 98 - 111 mmol/L   CO2 22 22 - 32 mmol/L   Glucose, Bld 91 70 - 99 mg/dL   BUN 10 6 - 20 mg/dL   Creatinine, Ser 9.14 (H) 0.44 - 1.00 mg/dL   Calcium 9.3 8.9 - 78.2 mg/dL   Total Protein 6.1 (L) 6.5 - 8.1 g/dL   Albumin 2.6 (L) 3.5 - 5.0 g/dL   AST 15 15 - 41 U/L   ALT 13 0 - 44 U/L   Alkaline Phosphatase 96 38 - 126 U/L   Total Bilirubin 0.5 0.3 - 1.2 mg/dL   GFR, Estimated  >95 >62 mL/min   Anion gap 11 5 - 15  Brain natriuretic peptide     Status: None   Collection Time: 12/27/19 12:10 PM  Result Value Ref Range   B Natriuretic Peptide 63.8 0.0 - 100.0 pg/mL  Troponin I (High Sensitivity)     Status: None   Collection Time: 12/27/19 12:10 PM  Result Value Ref Range   Troponin I (High Sensitivity) 4 <18 ng/L  TSH     Status: None   Collection Time: 12/27/19  3:04 PM  Result Value Ref Range   TSH 0.501 0.350 - 4.500 uIU/mL   US OB Follow Up  Result Date: 12/12/2019 FOLLOW UP SONOGRAM Charlotte Smith is in the  office for a follow up sonogram for EFW. She is a 24 y.o. year old G2P1001 with Estimated Date of Delivery: 02/07/20 by LMP now at  [redacted]w[redacted]d weeks gestation. Thus far the pregnancy has been complicated by chronic renal disease. GESTATION: SINGLETON PRESENTATION: cephalic FETAL ACTIVITY:          Heart rate         144          The fetus is active. AMNIOTIC FLUID: The amniotic fluid volume is  normal, 12 cm. PLACENTA LOCALIZATION:  posterior GRADE 1 CERVIX: Limited view ADNEXA: The ovaries are normal. GESTATIONAL AGE AND  BIOMETRICS: Gestational criteria: Estimated Date of Delivery: 02/07/20 by LMP now at [redacted]w[redacted]d Previous Scans:4          BIPARIETAL DIAMETER           7.73 cm         31 weeks HEAD CIRCUMFERENCE           29.32 cm         32+2 weeks ABDOMINAL CIRCUMFERENCE           27.99 cm         32 weeks FEMUR LENGTH           5.79 cm         30+2 weeks                                                       AVERAGE EGA(BY THIS SCAN):  31+1 weeks                                                 ESTIMATED FETAL WEIGHT:       1769  grams, 81 % ANATOMICAL SURVEY                                                                            COMMENTS CEREBRAL VENTRICLES yes normal  CHOROID PLEXUS yes normal  CEREBELLUM yes normal  CISTERNA MAGNA yes normal          NASAL BONE yes normal  NOSE/LIP yes normal  FACIAL PROFILE yes normal  4 CHAMBERED HEART yes normal  OUTFLOW TRACTS  yes normal  DIAPHRAGM yes normal  STOMACH yes normal  RENAL REGION yes normal  BLADDER yes normal      3 VESSEL CORD yes normal              GENITALIA   female     SUSPECTED ABNORMALITIES:  no QUALITY OF SCAN: satisfactory TECHNICIAN COMMENTS: [place] 30+1 wks,cephalic,posterior placenta gr 1,AFI 12 cm,fhr 144 bpm,EFW 1760 g 81% A copy of this report including all images has been saved and backed up to a second source for retrieval if needed. All measures and details of the anatomical scan, placentation, fluid volume and pelvic anatomy are contained in that report. Amber Flora Lipps 11/30/2019 2:06 PM  Clinical Impression and recommendations: I have reviewed the sonogram results above, combined with the patient's current clinical course, below are my impressions and any appropriate recommendations for management based on the sonographic findings. 1.  G2P1001 Estimated Date of Delivery: 02/07/20 by serial sonographic evaluations 2.  Fetal sonographic surveillance findings: a). Normal fluid volume b). Normal growth percentile with appropriate interval growth 81% 3.  Normal general sonographic findings Recommend continued prenatal evaluations and care based on this sonogram and as clinically indicated from the patient's clinical course. Lazaro Arms 12/12/2019 7:45 PM   US FETAL BPP WO NON STRESS  Result Date: 12/18/2019 FOLLOW UP SONOGRAM Charlotte Smith is in the office for a follow up sonogram for BPP. She is a 24 y.o. year old G2P1001 with Estimated Date of Delivery: 02/07/20 by LMP now at  [redacted]w[redacted]d weeks gestation. Thus far the pregnancy has been complicated by chronic renal DZ. GESTATION: SINGLETON PRESENTATION: cephalic FETAL ACTIVITY:          Heart rate         157          The fetus is active. AMNIOTIC FLUID: The amniotic fluid volume is  normal, 12.3 cm. PLACENTA LOCALIZATION:  posterior GRADE 1 CERVIX: Limited view ADNEXA: The ovaries are normal. GESTATIONAL AGE AND  BIOMETRICS: Gestational criteria: Estimated Date of  Delivery: 02/07/20 by LMP now at [redacted]w[redacted]d Previous Scans:5 BIOPHYSICAL PROFILE:                                                                                                      COMMENTS GROSS BODY MOVEMENT                 2  TONE                2  RESPIRATIONS                2  AMNIOTIC FLUID                2                                                          SCORE:  8/8 (Note: NST was not performed as part of this antepartum testing) ANATOMICAL SURVEY                                                                            COMMENTS CEREBRAL VENTRICLES yes normal  CHOROID PLEXUS yes normal  CEREBELLUM yes normal  CISTERNA MAGNA yes normal  FACIAL PROFILE yes normal  4 CHAMBERED HEART yes normal  OUTFLOW TRACTS yes normal  DIAPHRAGM yes normal  STOMACH yes normal  RENAL REGION yes normal  BLADDER yes normal      3 VESSEL CORD yes normal              GENITALIA   female     SUSPECTED ABNORMALITIES:  no QUALITY OF SCAN: satisfactory TECHNICIAN COMMENTS: [place] 32+1 wks,cephalic,posterior placenta gr 1,fhr 157 bpm,AFI 12.3 cm,BPP 8/8 A copy of this report including all images has been saved and backed up to a second source for retrieval if needed. All measures and details of the anatomical scan, placentation, fluid volume and pelvic anatomy are contained in that report. Amber Flora Lipps 12/14/2019 3:50 PM Clinical Impression and recommendations: I have reviewed the sonogram results above, combined with the patient's current clinical course, below are my impressions and any appropriate recommendations for management based on the sonographic findings. 1.  G2P1001 Estimated Date of Delivery: 02/07/20 by serial sonographic evaluations 2.  Fetal sonographic surveillance findings: a). Normal fluid volume b). Normal antepartum fetal assessment with BPP 8/8 3.  Normal general sonographic findings Recommend continued prenatal evaluations and care based on this sonogram and as clinically indicated from the patient's  clinical course. Lazaro Arms 12/18/2019 5:59 PM    MAU Course  Procedures  MDM -SOB, tachycardia episodes x2 without symptoms on arrival to MAU -lungs clear to auscultation, regular rate/rhythm -EKG: NSR -normal BP -UA: hazy/mod hgb/100PRO/rare bacteria -CBC: H/H 11.0/32.0, platelets 251 -CMP: serum 1.01, AST/ALT 15/13 -BNP: 63.8 (WNL) -Troponin I: 4 (WNL) -PCr: 0.99 -EFM: reactive       -baseline: 120/130       -variability: moderate       -accels: present, 15x15       -decels: absent       -TOCO: few, irregular ctx -consulted with cardiology, Dr. Duke Salvia who reviewed EKG and noted normal -consulted with Dr. Jolayne Panther in light of elevated serum creatinine, per Dr. Jolayne Panther, this is normal for having one single functioning kidney and patient can be discharged to home -cardiology to bedside with Holter monitor for patient, will follow-up with them in one week -pt discharged to home in stable  Orders Placed This Encounter  Procedures  . Urinalysis, Routine w reflex microscopic Urine, Clean Catch    Standing Status:   Standing    Number of Occurrences:   1  . CBC    Standing Status:   Standing    Number of Occurrences:   1  . Comprehensive metabolic panel    Standing Status:   Standing    Number of Occurrences:   1  . Protein / creatinine ratio, urine    Standing Status:   Standing    Number of Occurrences:   1  . Brain natriuretic peptide    Standing Status:   Standing    Number of Occurrences:   1  . TSH    Standing Status:   Standing    Number of Occurrences:   1  . ED EKG    Standing Status:   Standing    Number of Occurrences:   1    Order Specific Question:   Reason for Exam    Answer:   Chest Pain  . EKG 12-Lead    Standing Status:   Standing    Number of Occurrences:   1    Order Specific Question:   Reason for Exam  Answer:   SOB  . Discharge patient    Order Specific Question:   Discharge disposition    Answer:   01-Home or Self Care [1]     Order Specific Question:   Discharge patient date    Answer:   12/27/2019    No orders of the defined types were placed in this encounter.  Assessment and Plan   1. Shortness of breath   2. Tachycardia   3. Palpitations   4. [redacted] weeks gestation of pregnancy   5. NST (non-stress test) reactive     Allergies as of 12/27/2019   No Known Allergies     Medication List    TAKE these medications   FLINTSTONES COMPLETE PO Take 2 tablets by mouth.   KEFLEX PO Take by mouth daily.   omeprazole 10 MG capsule Commonly known as: PRILOSEC Take 1 bid       -pt with Holter monitor to home -return MAU precautions given -pt discharged to home in stable condition  Joni Reining E Jeramia Saleeby 12/27/2019, 4:16 PM

## 2019-12-27 NOTE — Discharge Instructions (Signed)
Fetal Movement Counts Patient Name: ________________________________________________ Patient Due Date: ____________________ What is a fetal movement count?  A fetal movement count is the number of times that you feel your baby move during a certain amount of time. This may also be called a fetal kick count. A fetal movement count is recommended for every pregnant woman. You may be asked to start counting fetal movements as early as week 28 of your pregnancy. Pay attention to when your baby is most active. You may notice your baby's sleep and wake cycles. You may also notice things that make your baby move more. You should do a fetal movement count:  When your baby is normally most active.  At the same time each day. A good time to count movements is while you are resting, after having something to eat and drink. How do I count fetal movements? 1. Find a quiet, comfortable area. Sit, or lie down on your side. 2. Write down the date, the start time and stop time, and the number of movements that you felt between those two times. Take this information with you to your health care visits. 3. Write down your start time when you feel the first movement. 4. Count kicks, flutters, swishes, rolls, and jabs. You should feel at least 10 movements. 5. You may stop counting after you have felt 10 movements, or if you have been counting for 2 hours. Write down the stop time. 6. If you do not feel 10 movements in 2 hours, contact your health care provider for further instructions. Your health care provider may want to do additional tests to assess your baby's well-being. Contact a health care provider if:  You feel fewer than 10 movements in 2 hours.  Your baby is not moving like he or she usually does. Date: ____________ Start time: ____________ Stop time: ____________ Movements: ____________ Date: ____________ Start time: ____________ Stop time: ____________ Movements: ____________ Date: ____________  Start time: ____________ Stop time: ____________ Movements: ____________ Date: ____________ Start time: ____________ Stop time: ____________ Movements: ____________ Date: ____________ Start time: ____________ Stop time: ____________ Movements: ____________ Date: ____________ Start time: ____________ Stop time: ____________ Movements: ____________ Date: ____________ Start time: ____________ Stop time: ____________ Movements: ____________ Date: ____________ Start time: ____________ Stop time: ____________ Movements: ____________ Date: ____________ Start time: ____________ Stop time: ____________ Movements: ____________ This information is not intended to replace advice given to you by your health care provider. Make sure you discuss any questions you have with your health care provider. Document Revised: 08/26/2018 Document Reviewed: 08/26/2018 Elsevier Patient Education  2020 [employer] Preterm Labor and Birth Information  The normal length of a pregnancy is 39-41 weeks. Preterm labor is when labor starts before 37 completed weeks of pregnancy. What are the risk factors for preterm labor? Preterm labor is more likely to occur in women who:  Have certain infections during pregnancy such as a bladder infection, sexually transmitted infection, or infection inside the uterus (chorioamnionitis).  Have a shorter-than-normal cervix.  Have gone into preterm labor before.  Have had surgery on their cervix.  Are younger than age 17 or older than age 35.  Are African American.  Are pregnant with twins or multiple babies (multiple gestation).  Take street drugs or smoke while pregnant.  Do not gain enough weight while pregnant.  Became pregnant shortly after having been pregnant. What are the symptoms of preterm labor? Symptoms of preterm labor include:  Cramps similar to those that can happen during a menstrual period. The   cramps may happen with diarrhea.  Pain in the abdomen or lower  back.  Regular uterine contractions that may feel like tightening of the abdomen.  A feeling of increased pressure in the pelvis.  Increased watery or bloody mucus discharge from the vagina.  Water breaking (ruptured amniotic sac). Why is it important to recognize signs of preterm labor? It is important to recognize signs of preterm labor because babies who are born prematurely may not be fully developed. This can put them at an increased risk for:  Long-term (chronic) heart and lung problems.  Difficulty immediately after birth with regulating body systems, including blood sugar, body temperature, heart rate, and breathing rate.  Bleeding in the brain.  Cerebral palsy.  Learning difficulties.  Death. These risks are highest for babies who are born before 34 weeks of pregnancy. How is preterm labor treated? Treatment depends on the length of your pregnancy, your condition, and the health of your baby. It may involve:  Having a stitch (suture) placed in your cervix to prevent your cervix from opening too early (cerclage).  Taking or being given medicines, such as: ? Hormone medicines. These may be given early in pregnancy to help support the pregnancy. ? Medicine to stop contractions. ? Medicines to help mature the baby's lungs. These may be prescribed if the risk of delivery is high. ? Medicines to prevent your baby from developing cerebral palsy. If the labor happens before 34 weeks of pregnancy, you may need to stay in the hospital. What should I do if I think I am in preterm labor? If you think that you are going into preterm labor, call your health care provider right away. How can I prevent preterm labor in future pregnancies? To increase your chance of having a full-term pregnancy:  Do not use any tobacco products, such as cigarettes, chewing tobacco, and e-cigarettes. If you need help quitting, ask your health care provider.  Do not use street drugs or medicines that  have not been prescribed to you during your pregnancy.  Talk with your health care provider before taking any herbal supplements, even if you have been taking them regularly.  Make sure you gain a healthy amount of weight during your pregnancy.  Watch for infection. If you think that you might have an infection, get it checked right away.  Make sure to tell your health care provider if you have gone into preterm labor before. This information is not intended to replace advice given to you by your health care provider. Make sure you discuss any questions you have with your health care provider. Document Revised: 04/30/2018 Document Reviewed: 05/30/2015 Elsevier Patient Education  2020 [employer] Inc[employer]  

## 2019-12-27 NOTE — MAU Note (Signed)
PT reports she has been having dizzy spells since last Friday along with tachycardia and SOB.  States she was at her OB this morning and they sent her here to be seen d/t her tachycardia.  Denies VB/LOF.  Endorses + FM.  Denies CTX.  States she hasn't ever felt spells like this before.

## 2019-12-27 NOTE — Progress Notes (Addendum)
   NURSE VISIT- BLOOD PRESSURE CHECK  SUBJECTIVE:  Charlotte Smith is a 24 y.o. G55P1001 female here for BP check. She is [redacted]w[redacted]d pregnant    HYPERTENSION ROS:  Pregnant/postpartum:  . Severe headaches that don't go away with tylenol/other medicines: No  . Visual changes (seeing spots/double/blurred vision) Yes  . Severe pain under right breast breast or in center of upper chest No  . Severe nausea/vomiting No  . Taking medicines as instructed not applicable  .   OBJECTIVE:  BP 130/87 (BP Location: Left Arm, Patient Position: Sitting, Cuff Size: Normal)   Pulse (!) 142   Ht 5\' 6"  (1.676 m)   Wt 187 lb 8 oz (85 kg)   LMP 05/03/2019 (Approximate)   BMI 30.26 kg/m   Appearance alert, well appearing, and in no distress.  ASSESSMENT: Pregnancy [redacted]w[redacted]d  blood pressure check  PLAN: Discussed with [redacted]w[redacted]d, CNM, [facility]   Recommendations: pt was advised to go to [facility].    Follow-up: as scheduled   [facility]  12/27/2019 9:01 AM   Pt called office 1st thing this am for appointment. Having sob, seeing stars, heart palpitations and heart racing x 2 days. Does not feel well. HR 130s-150s, pulse ox 98% RA. To [facility] for PE eval, notified [facility], CNM.  Gerrit Heck, CNM, [facility] 12/27/2019 9:53 AM

## 2019-12-27 NOTE — Telephone Encounter (Addendum)
error 

## 2019-12-28 ENCOUNTER — Encounter: Payer: Self-pay | Admitting: *Deleted

## 2019-12-28 ENCOUNTER — Other Ambulatory Visit: Payer: No Typology Code available for payment source

## 2019-12-29 ENCOUNTER — Other Ambulatory Visit: Payer: No Typology Code available for payment source

## 2019-12-29 ENCOUNTER — Other Ambulatory Visit: Payer: Self-pay | Admitting: Advanced Practice Midwife

## 2019-12-29 DIAGNOSIS — N189 Chronic kidney disease, unspecified: Secondary | ICD-10-CM

## 2019-12-30 ENCOUNTER — Other Ambulatory Visit: Payer: Self-pay

## 2019-12-30 ENCOUNTER — Ambulatory Visit (INDEPENDENT_AMBULATORY_CARE_PROVIDER_SITE_OTHER): Payer: No Typology Code available for payment source | Admitting: Obstetrics & Gynecology

## 2019-12-30 ENCOUNTER — Ambulatory Visit (INDEPENDENT_AMBULATORY_CARE_PROVIDER_SITE_OTHER): Payer: No Typology Code available for payment source

## 2019-12-30 ENCOUNTER — Encounter: Payer: Self-pay | Admitting: Obstetrics & Gynecology

## 2019-12-30 VITALS — BP 125/80 | HR 100 | Wt 187.5 lb

## 2019-12-30 DIAGNOSIS — Z3A34 34 weeks gestation of pregnancy: Secondary | ICD-10-CM

## 2019-12-30 DIAGNOSIS — Z1389 Encounter for screening for other disorder: Secondary | ICD-10-CM

## 2019-12-30 DIAGNOSIS — N189 Chronic kidney disease, unspecified: Secondary | ICD-10-CM

## 2019-12-30 DIAGNOSIS — Z331 Pregnant state, incidental: Secondary | ICD-10-CM

## 2019-12-30 DIAGNOSIS — O0990 Supervision of high risk pregnancy, unspecified, unspecified trimester: Secondary | ICD-10-CM

## 2019-12-30 DIAGNOSIS — Z029 Encounter for administrative examinations, unspecified: Secondary | ICD-10-CM

## 2019-12-30 LAB — POCT URINALYSIS DIPSTICK OB
Glucose, UA: NEGATIVE
Ketones, UA: NEGATIVE
Nitrite, UA: NEGATIVE

## 2019-12-30 NOTE — Progress Notes (Signed)
HIGH-RISK PREGNANCY VISIT Patient name: Charlotte Smith MRN [id number]  Date of birth: 1995-10-24 Chief Complaint:   High Risk Gestation ([place] today)  History of Present Illness:   Charlotte Smith is a 24 y.o. G37P1001 female at [redacted]w[redacted]d with an Estimated Date of Delivery: 02/07/20 being seen today for ongoing management of a high-risk pregnancy complicated by chronic renal disease.  Today she reports no complaints.  Depression screen [facility] 2/9 11/02/2019 08/01/2019 08/01/2019  Decreased Interest 0 0 0  Down, Depressed, Hopeless 0 0 0  PHQ - 2 Score 0 0 0  Altered sleeping 0 0 0  Tired, decreased energy 0 2 2  Change in appetite 0 2 2  Feeling bad or failure about yourself  0 0 0  Trouble concentrating 0 0 0  Moving slowly or fidgety/restless 0 0 0  Suicidal thoughts 0 0 0  PHQ-9 Score 0 4 4    Contractions: Not present. Vag. Bleeding: None.  Movement: Present. denies leaking of fluid.  Review of Systems:   Pertinent items are noted in HPI Denies abnormal vaginal discharge w/ itching/odor/irritation, headaches, visual changes, shortness of breath, chest pain, abdominal pain, severe nausea/vomiting, or problems with urination or bowel movements unless otherwise stated above. Pertinent History Reviewed:  Reviewed past medical,surgical, social, obstetrical and family history.  Reviewed problem list, medications and allergies. Physical Assessment:   Vitals:   12/30/19 0921  BP: 125/80  Pulse: 100  Weight: 187 lb 8 oz (85 kg)  Body mass index is 30.26 kg/m.           Physical Examination:   General appearance: alert, well appearing, and in no distress  Mental status: alert, oriented to person, place, and time  Skin: warm & dry   Extremities: Edema: None    Cardiovascular: normal heart rate noted  Respiratory: normal respiratory effort, no distress  Abdomen: gravid, soft, non-tender  Pelvic: Cervical exam deferred         Fetal Status: Fetal Heart Rate (bpm): 123 Fundal Height: 34  cm Movement: Present    Fetal Surveillance Testing today: BPP 8/8 normal growth   Chaperone: N/A    Results for orders placed or performed in visit on 12/30/19 (from the past 24 hour(s))  POC Urinalysis Dipstick OB   Collection Time: 12/30/19  9:24 AM  Result Value Ref Range   Color, UA     Clarity, UA     Glucose, UA Negative Negative   Bilirubin, UA     Ketones, UA neg    Spec Grav, UA     Blood, UA 3+    pH, UA     POC,PROTEIN,UA Large (3+) Negative, Trace, Small (1+), Moderate (2+), Large (3+), 4+   Urobilinogen, UA     Nitrite, UA neg    Leukocytes, UA Trace (A) Negative   Appearance     Odor      Assessment & Plan:  High-risk pregnancy: G2P1001 at [redacted]w[redacted]d with an Estimated Date of Delivery: 02/07/20   1) CKD, chronic proteinuria, deliver at 37 weeeks, BP is stable Cr is stable, sonogram is good today  2) SVT, resolved for now, has Holter on,   Meds: No orders of the defined types were placed in this encounter.   Labs/procedures today:   Treatment Plan:  Twice weekly surevillance, deliver at 37 weeks, C section 01/19/20  Reviewed: Preterm labor symptoms and general obstetric precautions including but not limited to vaginal bleeding, contractions, leaking of fluid and fetal movement were reviewed  in detail with the patient.  All questions were answered. Has home bp cuff. Rx faxed to . Check bp weekly, let us know if >140/90.   Follow-up: Return in about 4 days (around 01/03/2020) for NST, Nurse only.   Future Appointments  Date Time Provider Department Center  12/30/2019  9:50 AM Lazaro Arms, MD [facility]  01/03/2020  3:30 PM Cheral Marker, CNM [facility]  01/06/2020 11:50 AM [facility] NURSE [facility]  01/06/2020  3:40 PM Chilton Si, MD [facility]  01/10/2020  3:30 PM Cheral Marker, CNM [facility]    Orders Placed This Encounter  Procedures  . POC Urinalysis Dipstick OB   Lazaro Arms  12/30/2019 9:43 AM

## 2019-12-30 NOTE — Progress Notes (Signed)
[place] 34+3 wks,cephalic,BPP 8/8,FHR 123 bpm,posterior placenta gr 2,AFI 9.4 cm,EFW 2578 g 63%

## 2020-01-03 ENCOUNTER — Encounter: Payer: Self-pay | Admitting: Women's Health

## 2020-01-03 ENCOUNTER — Other Ambulatory Visit: Payer: Self-pay

## 2020-01-03 ENCOUNTER — Ambulatory Visit (INDEPENDENT_AMBULATORY_CARE_PROVIDER_SITE_OTHER): Payer: No Typology Code available for payment source | Admitting: Women's Health

## 2020-01-03 VITALS — BP 129/77 | HR 107 | Wt 189.0 lb

## 2020-01-03 DIAGNOSIS — O0993 Supervision of high risk pregnancy, unspecified, third trimester: Secondary | ICD-10-CM

## 2020-01-03 DIAGNOSIS — Z3A35 35 weeks gestation of pregnancy: Secondary | ICD-10-CM

## 2020-01-03 DIAGNOSIS — N189 Chronic kidney disease, unspecified: Secondary | ICD-10-CM | POA: Diagnosis not present

## 2020-01-03 DIAGNOSIS — Z331 Pregnant state, incidental: Secondary | ICD-10-CM

## 2020-01-03 DIAGNOSIS — Z1389 Encounter for screening for other disorder: Secondary | ICD-10-CM

## 2020-01-03 LAB — POCT URINALYSIS DIPSTICK OB
Glucose, UA: NEGATIVE
Ketones, UA: NEGATIVE
Leukocytes, UA: NEGATIVE
Nitrite, UA: NEGATIVE

## 2020-01-03 NOTE — Progress Notes (Signed)
HIGH-RISK PREGNANCY VISIT Patient name: Charlotte Smith MRN [id number]  Date of birth: 07-23-1995 Chief Complaint:   Routine Prenatal Visit  History of Present Illness:   Charlotte Smith is a 24 y.o. G2P1001 female at [redacted]w[redacted]d with an Estimated Date of Delivery: 02/07/20 being seen today for ongoing management of a high-risk pregnancy complicated by chronic renal disease.  Today she reports cramping and pressure.  Depression screen [facility] 2/9 11/02/2019 08/01/2019 08/01/2019  Decreased Interest 0 0 0  Down, Depressed, Hopeless 0 0 0  PHQ - 2 Score 0 0 0  Altered sleeping 0 0 0  Tired, decreased energy 0 2 2  Change in appetite 0 2 2  Feeling bad or failure about yourself  0 0 0  Trouble concentrating 0 0 0  Moving slowly or fidgety/restless 0 0 0  Suicidal thoughts 0 0 0  PHQ-9 Score 0 4 4    Contractions: Not present. Vag. Bleeding: None.  Movement: Present. denies leaking of fluid.  Review of Systems:   Pertinent items are noted in HPI Denies abnormal vaginal discharge w/ itching/odor/irritation, headaches, visual changes, shortness of breath, chest pain, abdominal pain, severe nausea/vomiting, or problems with urination or bowel movements unless otherwise stated above. Pertinent History Reviewed:  Reviewed past medical,surgical, social, obstetrical and family history.  Reviewed problem list, medications and allergies. Physical Assessment:   Vitals:   01/03/20 1533  BP: 129/77  Pulse: (!) 107  Weight: 189 lb (85.7 kg)  Body mass index is 30.51 kg/m.           Physical Examination:   General appearance: alert, well appearing, and in no distress  Mental status: alert, oriented to person, place, and time  Skin: warm & dry   Extremities: Edema: None    Cardiovascular: normal heart rate noted  Respiratory: normal respiratory effort, no distress  Abdomen: gravid, soft, non-tender  Pelvic: Cervical exam deferred         Fetal Status: Fetal Heart Rate (bpm): 135   Movement:  Present    Fetal Surveillance Testing today: NST: FHR baseline 135 bpm, Variability: moderate, Accelerations:present, Decelerations:  Absent= Cat 1/Reactive Toco: none     Chaperone: N/A    Results for orders placed or performed in visit on 01/03/20 (from the past 24 hour(s))  POC Urinalysis Dipstick OB   Collection Time: 01/03/20  3:30 PM  Result Value Ref Range   Color, UA     Clarity, UA     Glucose, UA Negative Negative   Bilirubin, UA     Ketones, UA neg    Spec Grav, UA     Blood, UA mod    pH, UA     POC,PROTEIN,UA Moderate (2+) Negative, Trace, Small (1+), Moderate (2+), Large (3+), 4+   Urobilinogen, UA     Nitrite, UA neg    Leukocytes, UA Negative Negative   Appearance     Odor      Assessment & Plan:  High-risk pregnancy: G2P1001 at [redacted]w[redacted]d with an Estimated Date of Delivery: 02/07/20   1) Chronic renal disease, rt deteriorated kidney, chronic proteinuria & hematuria, pyelo, & recurrent UTI>stable  2) Prev c/s, for RCS w/ bilateral salpingectomy 12/30 as scheduled   3) Recent SVT> no episodes since, sees cardiology Friday  Meds: No orders of the defined types were placed in this encounter.   Labs/procedures today: nst  Treatment Plan:  2x/wk NST, RCS  @ 37wks  Reviewed: Preterm labor symptoms and general obstetric precautions including but  not limited to vaginal bleeding, contractions, leaking of fluid and fetal movement were reviewed in detail with the patient.  All questions were answered. Has home bp cuff. Check bp weekly, let us know if >140/90.   Follow-up: Return for As scheduled Fri nst/nurse, then Tues nst/hrob.   Future Appointments  Date Time Provider Department Center  01/06/2020 11:50 AM [facility] NURSE [facility]  01/06/2020  3:40 PM Chilton Si, MD [facility]  01/10/2020  3:30 PM Cheral Marker, CNM [facility]    Orders Placed This Encounter  Procedures  . POC Urinalysis Dipstick OB   Cheral Marker CNM,  [facility] 01/03/2020 4:06 PM

## 2020-01-03 NOTE — Patient Instructions (Signed)
Charlotte Smith, I greatly value your feedback.  If you receive a survey following your visit with [place] today, we appreciate you taking the time to fill it out.  Thanks, [name], CNM, WHNP-BC  [facility] ([address] [place], [place] [address]) Entrance C, located off of [address] valet parking   Go to [email or web address] to register for FREE online childbirth classes    Call the office [phone]) or go to [facility] if:  You begin to have strong, frequent contractions  Your water breaks.  Sometimes it is a big gush of fluid, sometimes it is just a trickle that keeps getting your panties wet or running down your legs  You have vaginal bleeding.  It is normal to have a small amount of spotting if your cervix was checked.   You don't feel your baby moving like normal.  If you don't, get you something to eat and drink and lay down and focus on feeling your baby move.  You should feel at least 10 movements in 2 hours.  If you don't, you should call the office or go to [facility].   Call the office [phone]) or go to [facility] hospital for these signs of pre-eclampsia:  Severe headache that does not go away with Tylenol  Visual changes- seeing spots, double, blurred vision  Pain under your right breast or upper abdomen that does not go away with Tums or heartburn medicine  Nausea and/or vomiting  Severe swelling in your hands, feet, and face    Home Blood Pressure Monitoring for Patients   Your provider has recommended that you check your blood pressure (BP) at least once a week at home. If you do not have a blood pressure cuff at home, one will be provided for you. Contact your provider if you have not received your monitor within 1 week.   Helpful Tips for Accurate Home Blood Pressure Checks  . Don't smoke, exercise, or drink caffeine 30 minutes before checking your BP . Use the restroom before checking your BP (a full  bladder can raise your pressure) . Relax in a comfortable upright chair . Feet on the ground . Left arm resting comfortably on a flat surface at the level of your heart . Legs uncrossed . Back supported . Sit quietly and don't talk . Place the cuff on your bare arm . Adjust snuggly, so that only two fingertips can fit between your skin and the top of the cuff . Check 2 readings separated by at least one minute . Keep a log of your BP readings . For a visual, please reference this diagram: http://ccnc.care/bpdiagram  Provider Name: [facility]     Phone: [phone]  Zone 1: ALL CLEAR  Continue to monitor your symptoms:  . BP reading is less than 140 (top number) or less than 90 (bottom number)  . No right upper stomach pain . No headaches or seeing spots . No feeling nauseated or throwing up . No swelling in face and hands  Zone 2: CAUTION Call your doctor's office for any of the following:  . BP reading is greater than 140 (top number) or greater than 90 (bottom number)  . Stomach pain under your ribs in the middle or right side . Headaches or seeing spots . Feeling nauseated or throwing up . Swelling in face and hands  Zone 3: EMERGENCY  Seek immediate medical care if you have any of the  following:  . BP reading is greater than160 (top number) or greater than 110 (bottom number) . Severe headaches not improving with Tylenol . Serious difficulty catching your breath . Any worsening symptoms from Zone 2  Preterm Labor and Birth Information  The normal length of a pregnancy is 39-41 weeks. Preterm labor is when labor starts before 37 completed weeks of pregnancy. What are the risk factors for preterm labor? Preterm labor is more likely to occur in women who:  Have certain infections during pregnancy such as a bladder infection, sexually transmitted infection, or infection inside the uterus (chorioamnionitis).  Have a shorter-than-normal cervix.  Have gone into  preterm labor before.  Have had surgery on their cervix.  Are younger than age 41 or older than age 6.  Are African American.  Are pregnant with twins or multiple babies (multiple gestation).  Take street drugs or smoke while pregnant.  Do not gain enough weight while pregnant.  Became pregnant shortly after having been pregnant. What are the symptoms of preterm labor? Symptoms of preterm labor include:  Cramps similar to those that can happen during a menstrual period. The cramps may happen with diarrhea.  Pain in the abdomen or lower back.  Regular uterine contractions that may feel like tightening of the abdomen.  A feeling of increased pressure in the pelvis.  Increased watery or bloody mucus discharge from the vagina.  Water breaking (ruptured amniotic sac). Why is it important to recognize signs of preterm labor? It is important to recognize signs of preterm labor because babies who are born prematurely may not be fully developed. This can put them at an increased risk for:  Long-term (chronic) heart and lung problems.  Difficulty immediately after birth with regulating body systems, including blood sugar, body temperature, heart rate, and breathing rate.  Bleeding in the brain.  Cerebral palsy.  Learning difficulties.  Death. These risks are highest for babies who are born before 21 weeks of pregnancy. How is preterm labor treated? Treatment depends on the length of your pregnancy, your condition, and the health of your baby. It may involve: 1. Having a stitch (suture) placed in your cervix to prevent your cervix from opening too early (cerclage). 2. Taking or being given medicines, such as: ? Hormone medicines. These may be given early in pregnancy to help support the pregnancy. ? Medicine to stop contractions. ? Medicines to help mature the baby's lungs. These may be prescribed if the risk of delivery is high. ? Medicines to prevent your baby from  developing cerebral palsy. If the labor happens before 34 weeks of pregnancy, you may need to stay in the hospital. What should I do if I think I am in preterm labor? If you think that you are going into preterm labor, call your health care provider right away. How can I prevent preterm labor in future pregnancies? To increase your chance of having a full-term pregnancy:  Do not use any tobacco products, such as cigarettes, chewing tobacco, and e-cigarettes. If you need help quitting, ask your health care provider.  Do not use street drugs or medicines that have not been prescribed to you during your pregnancy.  Talk with your health care provider before taking any herbal supplements, even if you have been taking them regularly.  Make sure you gain a healthy amount of weight during your pregnancy.  Watch for infection. If you think that you might have an infection, get it checked right away.  Make sure to tell  your health care provider if you have gone into preterm labor before. This information is not intended to replace advice given to you by your health care provider. Make sure you discuss any questions you have with your health care provider. Document Revised: 04/30/2018 Document Reviewed: 05/30/2015 Elsevier Patient Education  [place].

## 2020-01-05 ENCOUNTER — Other Ambulatory Visit: Payer: No Typology Code available for payment source

## 2020-01-06 ENCOUNTER — Encounter: Payer: Self-pay | Admitting: Cardiovascular Disease

## 2020-01-06 ENCOUNTER — Ambulatory Visit (INDEPENDENT_AMBULATORY_CARE_PROVIDER_SITE_OTHER): Payer: No Typology Code available for payment source | Admitting: *Deleted

## 2020-01-06 ENCOUNTER — Ambulatory Visit (INDEPENDENT_AMBULATORY_CARE_PROVIDER_SITE_OTHER): Payer: No Typology Code available for payment source | Admitting: Cardiovascular Disease

## 2020-01-06 ENCOUNTER — Other Ambulatory Visit: Payer: Self-pay

## 2020-01-06 VITALS — BP 116/76 | HR 108 | Ht 66.0 in | Wt 187.0 lb

## 2020-01-06 VITALS — BP 121/78 | HR 116 | Wt 188.0 lb

## 2020-01-06 DIAGNOSIS — O0990 Supervision of high risk pregnancy, unspecified, unspecified trimester: Secondary | ICD-10-CM

## 2020-01-06 DIAGNOSIS — Z331 Pregnant state, incidental: Secondary | ICD-10-CM | POA: Diagnosis not present

## 2020-01-06 DIAGNOSIS — Z3A35 35 weeks gestation of pregnancy: Secondary | ICD-10-CM

## 2020-01-06 DIAGNOSIS — N189 Chronic kidney disease, unspecified: Secondary | ICD-10-CM

## 2020-01-06 DIAGNOSIS — R000 Tachycardia, unspecified: Secondary | ICD-10-CM | POA: Diagnosis not present

## 2020-01-06 DIAGNOSIS — Z1389 Encounter for screening for other disorder: Secondary | ICD-10-CM

## 2020-01-06 HISTORY — DX: Tachycardia, unspecified: R00.0

## 2020-01-06 LAB — POCT URINALYSIS DIPSTICK OB
Glucose, UA: NEGATIVE
Ketones, UA: NEGATIVE
Nitrite, UA: NEGATIVE

## 2020-01-06 NOTE — Patient Instructions (Signed)
Medication Instructions:  Your physician recommends that you continue on your current medications as directed. Please refer to the Current Medication list given to you today.   *If you need a refill on your cardiac medications before your next appointment, please call your pharmacy*  Lab Work: none  Testing/Procedures: none  Follow-Up: At [facility], you and your health needs are our priority.  As part of our continuing mission to provide you with exceptional heart care, we have created designated Provider Care Teams.  These Care Teams include your primary Cardiologist (physician) and Advanced Practice Providers (APPs -  Physician Assistants and Nurse Practitioners) who all work together to provide you with the care you need, when you need it.  We recommend signing up for the patient portal called "MyChart".  Sign up information is provided on this After Visit Summary.  MyChart is used to connect with patients for Virtual Visits (Telemedicine).  Patients are able to view lab/test results, encounter notes, upcoming appointments, etc.  Non-urgent messages can be sent to your provider as well.   To learn more about what you can do with MyChart, go to [email or web address].    Your next appointment:   6 week(s)  The format for your next appointment:   In Person  Provider:   You will see one of the following Advanced Practice Providers on your designated Care Team:    Corine Shelter, PA-C  [place], [place]  Edd Fabian, [place]

## 2020-01-06 NOTE — Progress Notes (Addendum)
   NURSE VISIT- NST  SUBJECTIVE:  Charlotte Smith is a 24 y.o. G2P1001 female at [redacted]w[redacted]d, here for a NST for pregnancy complicated by Chronic renal disease.  She reports active fetal movement, contractions: none, vaginal bleeding: none, membranes: intact.   OBJECTIVE:  BP 121/78   Pulse (!) 116   Wt 188 lb (85.3 kg)   LMP 05/03/2019 (Approximate)   BMI 30.34 kg/m   Appears well, no apparent distress  Results for orders placed or performed in visit on 01/06/20 (from the past 24 hour(s))  POC Urinalysis Dipstick OB   Collection Time: 01/06/20 11:54 AM  Result Value Ref Range   Color, UA     Clarity, UA     Glucose, UA Negative Negative   Bilirubin, UA     Ketones, UA neg    Spec Grav, UA     Blood, UA 3+    pH, UA     POC,PROTEIN,UA Large (3+) Negative, Trace, Small (1+), Moderate (2+), Large (3+), 4+   Urobilinogen, UA     Nitrite, UA neg    Leukocytes, UA Trace (A) Negative   Appearance     Odor      NST: FHR baseline 135 bpm, Variability: moderate, Accelerations:present, Decelerations:  Absent= Cat 1/Reactive Toco: none   ASSESSMENT: G2P1001 at [redacted]w[redacted]d with Chronic renal disease NST reactive  PLAN: EFM strip reviewed by Dr. Despina Hidden   Recommendations: keep next appointment as scheduled    Jobe Marker  01/06/2020 12:22 PM  Attestation of Attending Supervision of Nursing Visit Encounter: Evaluation and management procedures were performed by the nursing staff under my supervision and collaboration.  I have reviewed the nurse's note and chart, and I agree with the management and plan.  Rockne Coons MD Attending Physician for the [facility] 01/22/2020 7:53 AM

## 2020-01-06 NOTE — Progress Notes (Signed)
Cardiology Office Note  Date:  01/06/2020   ID:  Charlotte Smith, DOB 12-Sep-1995, MRN [id number]  PCP:  Benita Stabile, MD  Cardiologist:   Chilton Si, MD   No chief complaint on file.    History of Present Illness: Charlotte Smith is a 24 y.o. female G2P1001 at 76 weeks who is being seen today for the evaluation of dizziness and tachycaria at the request of Benita Stabile, MD.  She started having episodes of tachycardia to the 150s about 3 weeks ago.  The episodes lasted 30 minutes to an hour.  When it occurred she felt lightheaded and presyncopal but did not actually pass out.  There is no associated chest pain but she did get short of breath.  She went to the ED for evaluation but by the time she arrived her symptoms had resolved.  She notes that she was drinking 2, 20-ounce Mountain Dew's daily prior to this episode.  Since then she has not been drinking caffeine.  Her pregnancy has otherwise been unremarkable.  She generally feels well.  She does not get much formal exercise but does walk a lot at work.  She has no exertional symptoms.  She denies lower extremity edema, orthopnea, or PND.  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   Past Medical History:  Diagnosis Date  . Renal disorder    non-functioning left kidney  . Tachycardia 01/06/2020    Past Surgical History:  Procedure Laterality Date  . BLADDER SURGERY    . CESAREAN SECTION N/A 10/30/2014   Procedure: CESAREAN SECTION;  Surgeon: Lazaro Arms, MD;  Location: [facility];  Service: Obstetrics;  Laterality: N/A;     Current Outpatient Medications  Medication Sig Dispense Refill  . Cephalexin (KEFLEX PO) Take by mouth daily.    Marland Kitchen omeprazole (PRILOSEC) 10 MG capsule Take 1 bid 60 capsule 3  . Pediatric Multivitamins-Iron (FLINTSTONES COMPLETE PO) Take 2 tablets by mouth.     No current facility-administered medications for this visit.     Allergies:   Patient has no known allergies.    Social History:  The patient  reports that she has never smoked. She has never used smokeless tobacco. She reports that she does not drink alcohol and does not use drugs.   Family History:  The patient's family history includes Cancer in her maternal grandmother and paternal grandfather; Depression in her mother; Hypertension in her father.    ROS:  Please see the history of present illness.   Otherwise, review of systems are positive for none.   All other systems are reviewed and negative.    PHYSICAL EXAM: VS:  BP 116/76 (BP Location: Left Arm, Patient Position: Sitting)   Pulse (!) 108   Ht 5\' 6"  (1.676 m)   Wt 187 lb (84.8 kg)   LMP 05/03/2019 (Approximate)   SpO2 99%   BMI 30.18 kg/m  , BMI Body mass index is 30.18 kg/m. GENERAL:  Well appearing HEENT:  Pupils equal round and reactive, fundi not visualized, oral mucosa unremarkable NECK:  No jugular venous distention, waveform within normal limits, carotid upstroke brisk and symmetric, no bruits, no thyromegaly LYMPHATICS:  No cervical adenopathy LUNGS:  Clear to auscultation bilaterally HEART:  Tachycardic.  Regular rhythm.  PMI not displaced or sustained,S1 and S2 within normal limits, no S3, no S4, no clicks, no rubs, no murmurs ABD:  Flat,  positive bowel sounds normal in frequency in pitch, no bruits, no rebound, no guarding, no midline pulsatile mass, no hepatomegaly, no splenomegaly EXT:  2 plus pulses throughout, no edema, no cyanosis no clubbing SKIN:  No rashes no nodules NEURO:  Cranial nerves II through XII grossly intact, motor grossly intact throughout PSYCH:  Cognitively intact, oriented to person place and time   EKG:  EKG is ordered today. The ekg ordered today demonstrates sinus tachycardia.  Rate 101 bpm.    Recent Labs: 12/27/2019: ALT 13; B Natriuretic Peptide 63.8; BUN 10; Creatinine, Ser 1.01; Hemoglobin 11.0; Platelets 251; Potassium 4.6; Sodium  134; TSH 0.501    Lipid Panel No results found for: CHOL, TRIG, HDL, CHOLHDL, VLDL, LDLCALC, LDLDIRECT    Wt Readings from Last 3 Encounters:  01/06/20 187 lb (84.8 kg)  01/06/20 188 lb (85.3 kg)  01/03/20 189 lb (85.7 kg)      ASSESSMENT AND PLAN:  # Tachycardia:  Ms. Coachman is currently wearing a ZIO monitor.  She has not had any recurrent episodes.  Her thyroid and blood counts were checked and were both unremarkable.  She did not have any similar episodes of prior pregnancies.  I suspect this is related to her heavy caffeine intake.  Her symptoms have resolved by stopping the [facility].  She is scheduled to take the monitor off today.  We did discuss wearing it longer but it has been very itchy and she wants to take it off.  She could also consider getting the alive core app should she have further episodes.  Also suggested that she go to the fire department if she has another episode that S more than a few minutes.  They would be able to do an EKG for her.  For now, we will continue to monitor and not start any medications.  We will plan to see her again after she delivers.  Should she have any issues in the peripartum period will be available at any time in the hospital.  She does not have any evidence of structural heart disease and therefore an echocardiogram is unnecessary.  I do suspect she may have had SVT given the duration of her symptoms.   Current medicines are reviewed at length with the patient today.  The patient does not have concerns regarding medicines.  The following changes have been made:  no change  Labs/ tests ordered today include:   Orders Placed This Encounter  Procedures  . EKG 12-Lead     Disposition:   FU with Jull Harral C. Duke Salvia, MD, [facility] in 6 weeks     Signed, Bobbie Virden C. Duke Salvia, MD, [facility]  01/06/2020 5:52 PM    [facility]

## 2020-01-09 ENCOUNTER — Telehealth (HOSPITAL_COMMUNITY): Payer: Self-pay | Admitting: *Deleted

## 2020-01-09 NOTE — Telephone Encounter (Signed)
Preadmission screen  

## 2020-01-10 ENCOUNTER — Encounter: Payer: Self-pay | Admitting: Women's Health

## 2020-01-10 ENCOUNTER — Ambulatory Visit (INDEPENDENT_AMBULATORY_CARE_PROVIDER_SITE_OTHER): Payer: No Typology Code available for payment source | Admitting: Women's Health

## 2020-01-10 ENCOUNTER — Telehealth (HOSPITAL_COMMUNITY): Payer: Self-pay | Admitting: *Deleted

## 2020-01-10 ENCOUNTER — Other Ambulatory Visit (HOSPITAL_COMMUNITY)
Admission: RE | Admit: 2020-01-10 | Discharge: 2020-01-10 | Disposition: A | Discharge status: Home / Self Care | Payer: No Typology Code available for payment source | Source: Ambulatory Visit | Attending: Obstetrics & Gynecology | Admitting: Obstetrics & Gynecology

## 2020-01-10 ENCOUNTER — Other Ambulatory Visit: Payer: Self-pay

## 2020-01-10 VITALS — BP 120/79 | HR 87 | Wt 187.5 lb

## 2020-01-10 DIAGNOSIS — Z1389 Encounter for screening for other disorder: Secondary | ICD-10-CM

## 2020-01-10 DIAGNOSIS — N189 Chronic kidney disease, unspecified: Secondary | ICD-10-CM

## 2020-01-10 DIAGNOSIS — Z3A36 36 weeks gestation of pregnancy: Secondary | ICD-10-CM | POA: Diagnosis present

## 2020-01-10 DIAGNOSIS — O0990 Supervision of high risk pregnancy, unspecified, unspecified trimester: Secondary | ICD-10-CM | POA: Diagnosis present

## 2020-01-10 DIAGNOSIS — O0993 Supervision of high risk pregnancy, unspecified, third trimester: Secondary | ICD-10-CM | POA: Diagnosis not present

## 2020-01-10 DIAGNOSIS — Z331 Pregnant state, incidental: Secondary | ICD-10-CM

## 2020-01-10 LAB — POCT URINALYSIS DIPSTICK OB
Glucose, UA: NEGATIVE
Ketones, UA: NEGATIVE
Leukocytes, UA: NEGATIVE
Nitrite, UA: NEGATIVE

## 2020-01-10 NOTE — Progress Notes (Signed)
HIGH-RISK PREGNANCY VISIT Patient name: Charlotte Smith MRN [id number]  Date of birth: 09-Feb-1995 Chief Complaint:   High Risk Gestation (NST; GBS, GC/CHL)  History of Present Illness:   Charlotte Smith is a 24 y.o. G50P1001 female at [redacted]w[redacted]d with an Estimated Date of Delivery: 02/07/20 being seen today for ongoing management of a high-risk pregnancy complicated by chronic renal disease.  Today she reports no complaints.  Depression screen [facility] 2/9 11/02/2019 08/01/2019 08/01/2019  Decreased Interest 0 0 0  Down, Depressed, Hopeless 0 0 0  PHQ - 2 Score 0 0 0  Altered sleeping 0 0 0  Tired, decreased energy 0 2 2  Change in appetite 0 2 2  Feeling bad or failure about yourself  0 0 0  Trouble concentrating 0 0 0  Moving slowly or fidgety/restless 0 0 0  Suicidal thoughts 0 0 0  PHQ-9 Score 0 4 4    Contractions: Not present. Vag. Bleeding: None.  Movement: Present. denies leaking of fluid.  Review of Systems:   Pertinent items are noted in HPI Denies abnormal vaginal discharge w/ itching/odor/irritation, headaches, visual changes, shortness of breath, chest pain, abdominal pain, severe nausea/vomiting, or problems with urination or bowel movements unless otherwise stated above. Pertinent History Reviewed:  Reviewed past medical,surgical, social, obstetrical and family history.  Reviewed problem list, medications and allergies. Physical Assessment:   Vitals:   01/10/20 1549  BP: 120/79  Pulse: 87  Weight: 187 lb 8 oz (85 kg)  Body mass index is 30.26 kg/m.           Physical Examination:   General appearance: alert, well appearing, and in no distress  Mental status: alert, oriented to person, place, and time  Skin: warm & dry   Extremities: Edema: None    Cardiovascular: normal heart rate noted  Respiratory: normal respiratory effort, no distress  Abdomen: gravid, soft, non-tender  Pelvic: Cervical exam performed  Dilation: Closed Effacement (%): Thick Station:  Ballotable  Fetal Status:     Movement: Present Presentation: Vertex  Fetal Surveillance Testing today: NST: FHR baseline 120 bpm, Variability: moderate, Accelerations:present, Decelerations:  Absent= Cat 1/Reactive Toco: none    Chaperone: Malachy Mood    Results for orders placed or performed in visit on 01/10/20 (from the past 24 hour(s))  POC Urinalysis Dipstick OB   Collection Time: 01/10/20  3:50 PM  Result Value Ref Range   Color, UA     Clarity, UA     Glucose, UA Negative Negative   Bilirubin, UA     Ketones, UA neg    Spec Grav, UA     Blood, UA 3+    pH, UA     POC,PROTEIN,UA Large (3+) Negative, Trace, Small (1+), Moderate (2+), Large (3+), 4+   Urobilinogen, UA     Nitrite, UA neg    Leukocytes, UA Negative Negative   Appearance     Odor      Assessment & Plan:  High-risk pregnancy: G2P1001 at [redacted]w[redacted]d with an Estimated Date of Delivery: 02/07/20   1) Chronic renal disease, Rt deteriorated kidney, chronic proteinuria and hematuria, pyelo & recurrent uti> on keflex suppression  2) Prev c/s, for RCS 12/30  Meds: No orders of the defined types were placed in this encounter.   Labs/procedures today: gbs, gc/ct, sve, nst  Treatment Plan:  2x/wk nst, RCS @ 37wks  Reviewed: Preterm labor symptoms and general obstetric precautions including but not limited to vaginal bleeding, contractions, leaking of fluid and  fetal movement were reviewed in detail with the patient.  All questions were answered.   Follow-up: Return for Mon hrob & nst w/ md .   Future Appointments  Date Time Provider Department Center  01/17/2020  9:00 AM [facility]-LD PAT 1 [facility] None  01/17/2020 10:00 AM MC-SCREENING [facility] None  02/20/2020  1:45 PM [name], PA-C [facility]    Orders Placed This Encounter  Procedures  . Strep Gp B NAA  . POC Urinalysis Dipstick OB   Cheral Marker CNM, [facility] 01/10/2020 4:20 PM

## 2020-01-10 NOTE — Telephone Encounter (Signed)
Preadmission screen  

## 2020-01-10 NOTE — Patient Instructions (Signed)
Charlotte Smith, I greatly value your feedback.  If you receive a survey following your visit with us today, we appreciate you taking the time to fill it out.  Thanks, Kim Anita Mcadory, CNM, WHNP-BC  [facility] ([address] [place], [place] [address]) Entrance C, located off of [address] Free 24/7 valet parking   Go to [email or web address] to register for FREE online childbirth classes    Call the office ([phone]) or go to [facility] if:  You begin to have strong, frequent contractions  Your water breaks.  Sometimes it is a big gush of fluid, sometimes it is just a trickle that keeps getting your panties wet or running down your legs  You have vaginal bleeding.  It is normal to have a small amount of spotting if your cervix was checked.   You don't feel your baby moving like normal.  If you don't, get you something to eat and drink and lay down and focus on feeling your baby move.  You should feel at least 10 movements in 2 hours.  If you don't, you should call the office or go to [facility].   Call the office ([phone]) or go to [facility] for these signs of pre-eclampsia:  Severe headache that does not go away with Tylenol  Visual changes- seeing spots, double, blurred vision  Pain under your right breast or upper abdomen that does not go away with Tums or heartburn medicine  Nausea and/or vomiting  Severe swelling in your hands, feet, and face    Home Blood Pressure Monitoring for Patients   Your provider has recommended that you check your blood pressure (BP) at least once a week at home. If you do not have a blood pressure cuff at home, one will be provided for you. Contact your provider if you have not received your monitor within 1 week.   Helpful Tips for Accurate Home Blood Pressure Checks  . Don't smoke, exercise, or drink caffeine 30 minutes before checking your BP . Use the restroom before checking your BP (a full  bladder can raise your pressure) . Relax in a comfortable upright chair . Feet on the ground . Left arm resting comfortably on a flat surface at the level of your heart . Legs uncrossed . Back supported . Sit quietly and don't talk . Place the cuff on your bare arm . Adjust snuggly, so that only two fingertips can fit between your skin and the top of the cuff . Check 2 readings separated by at least one minute . Keep a log of your BP readings . For a visual, please reference this diagram: http://ccnc.care/bpdiagram  Provider Name: [facility]     Phone: [phone]  Zone 1: ALL CLEAR  Continue to monitor your symptoms:  . BP reading is less than 140 (top number) or less than 90 (bottom number)  . No right upper stomach pain . No headaches or seeing spots . No feeling nauseated or throwing up . No swelling in face and hands  Zone 2: CAUTION Call your doctor's office for any of the following:  . BP reading is greater than 140 (top number) or greater than 90 (bottom number)  . Stomach pain under your ribs in the middle or right side . Headaches or seeing spots . Feeling nauseated or throwing up . Swelling in face and hands  Zone 3: EMERGENCY  Seek immediate medical care if you have any of the   following:  . BP reading is greater than160 (top number) or greater than 110 (bottom number) . Severe headaches not improving with Tylenol . Serious difficulty catching your breath . Any worsening symptoms from Zone 2  Preterm Labor and Birth Information  The normal length of a pregnancy is 39-41 weeks. Preterm labor is when labor starts before 37 completed weeks of pregnancy. What are the risk factors for preterm labor? Preterm labor is more likely to occur in women who:  Have certain infections during pregnancy such as a bladder infection, sexually transmitted infection, or infection inside the uterus (chorioamnionitis).  Have a shorter-than-normal cervix.  Have gone into  preterm labor before.  Have had surgery on their cervix.  Are younger than age 41 or older than age 6.  Are African American.  Are pregnant with twins or multiple babies (multiple gestation).  Take street drugs or smoke while pregnant.  Do not gain enough weight while pregnant.  Became pregnant shortly after having been pregnant. What are the symptoms of preterm labor? Symptoms of preterm labor include:  Cramps similar to those that can happen during a menstrual period. The cramps may happen with diarrhea.  Pain in the abdomen or lower back.  Regular uterine contractions that may feel like tightening of the abdomen.  A feeling of increased pressure in the pelvis.  Increased watery or bloody mucus discharge from the vagina.  Water breaking (ruptured amniotic sac). Why is it important to recognize signs of preterm labor? It is important to recognize signs of preterm labor because babies who are born prematurely may not be fully developed. This can put them at an increased risk for:  Long-term (chronic) heart and lung problems.  Difficulty immediately after birth with regulating body systems, including blood sugar, body temperature, heart rate, and breathing rate.  Bleeding in the brain.  Cerebral palsy.  Learning difficulties.  Death. These risks are highest for babies who are born before 21 weeks of pregnancy. How is preterm labor treated? Treatment depends on the length of your pregnancy, your condition, and the health of your baby. It may involve: 1. Having a stitch (suture) placed in your cervix to prevent your cervix from opening too early (cerclage). 2. Taking or being given medicines, such as: ? Hormone medicines. These may be given early in pregnancy to help support the pregnancy. ? Medicine to stop contractions. ? Medicines to help mature the baby's lungs. These may be prescribed if the risk of delivery is high. ? Medicines to prevent your baby from  developing cerebral palsy. If the labor happens before 34 weeks of pregnancy, you may need to stay in the hospital. What should I do if I think I am in preterm labor? If you think that you are going into preterm labor, call your health care provider right away. How can I prevent preterm labor in future pregnancies? To increase your chance of having a full-term pregnancy:  Do not use any tobacco products, such as cigarettes, chewing tobacco, and e-cigarettes. If you need help quitting, ask your health care provider.  Do not use street drugs or medicines that have not been prescribed to you during your pregnancy.  Talk with your health care provider before taking any herbal supplements, even if you have been taking them regularly.  Make sure you gain a healthy amount of weight during your pregnancy.  Watch for infection. If you think that you might have an infection, get it checked right away.  Make sure to tell  your health care provider if you have gone into preterm labor before. This information is not intended to replace advice given to you by your health care provider. Make sure you discuss any questions you have with your health care provider. Document Revised: 04/30/2018 Document Reviewed: 05/30/2015 Elsevier Patient Education  [place].

## 2020-01-11 ENCOUNTER — Encounter (HOSPITAL_COMMUNITY): Payer: Self-pay

## 2020-01-11 ENCOUNTER — Telehealth (HOSPITAL_COMMUNITY): Payer: Self-pay | Admitting: *Deleted

## 2020-01-11 NOTE — Patient Instructions (Addendum)
Charlotte Smith  01/11/2020   Your procedure is scheduled on:  01/19/2020  Arrive at 0530 at Entrance C on [address] at [facility]  and [employer]. You are invited to use the FREE valet parking or use the Visitor's parking deck.  Pick up the phone at the desk and dial [phone].  Call this number if you have problems the morning of surgery: [phone]  Remember:   Do not eat food:(After Midnight) Desps de medianoche.  Do not drink clear liquids: (After Midnight) Desps de medianoche.  Take these medicines the morning of surgery with A SIP OF WATER:  none   Do not wear jewelry, make-up or nail polish.  Do not wear lotions, powders, or perfumes. Do not wear deodorant.  Do not shave 48 hours prior to surgery.  Do not bring valuables to the hospital.  [facility] is not   responsible for any belongings or valuables brought to the hospital.  Contacts, dentures or bridgework may not be worn into surgery.  Leave suitcase in the car. After surgery it may be brought to your room.  For patients admitted to the hospital, checkout time is 11:00 AM the day of              discharge.      Please read over the following fact sheets that you were given:     Preparing for Surgery

## 2020-01-11 NOTE — Telephone Encounter (Signed)
Preadmission screen  

## 2020-01-12 ENCOUNTER — Encounter (HOSPITAL_COMMUNITY): Payer: Self-pay

## 2020-01-12 LAB — CERVICOVAGINAL ANCILLARY ONLY
Chlamydia: NEGATIVE
Comment: NEGATIVE
Comment: NORMAL
Neisseria Gonorrhea: NEGATIVE

## 2020-01-12 LAB — STREP GP B NAA: Strep Gp B NAA: NEGATIVE

## 2020-01-16 ENCOUNTER — Other Ambulatory Visit: Payer: Self-pay

## 2020-01-16 ENCOUNTER — Encounter: Payer: Self-pay | Admitting: Obstetrics & Gynecology

## 2020-01-16 ENCOUNTER — Ambulatory Visit (INDEPENDENT_AMBULATORY_CARE_PROVIDER_SITE_OTHER): Payer: No Typology Code available for payment source | Admitting: Obstetrics & Gynecology

## 2020-01-16 VITALS — BP 128/82 | HR 92 | Wt 187.0 lb

## 2020-01-16 DIAGNOSIS — Z3A36 36 weeks gestation of pregnancy: Secondary | ICD-10-CM

## 2020-01-16 DIAGNOSIS — Z331 Pregnant state, incidental: Secondary | ICD-10-CM

## 2020-01-16 DIAGNOSIS — O0990 Supervision of high risk pregnancy, unspecified, unspecified trimester: Secondary | ICD-10-CM

## 2020-01-16 DIAGNOSIS — Z1389 Encounter for screening for other disorder: Secondary | ICD-10-CM

## 2020-01-16 LAB — POCT URINALYSIS DIPSTICK OB
Glucose, UA: NEGATIVE
Ketones, UA: NEGATIVE
Leukocytes, UA: NEGATIVE
Nitrite, UA: NEGATIVE

## 2020-01-16 NOTE — Progress Notes (Signed)
HIGH-RISK PREGNANCY VISIT Patient name: Charlotte Smith MRN [id number]  Date of birth: 1995/12/27 Chief Complaint:   High Risk Gestation (NST)  History of Present Illness:   Charlotte Smith is a 24 y.o. G61P1001 female at [redacted]w[redacted]d with an Estimated Date of Delivery: 02/07/20 being seen today for ongoing management of a high-risk pregnancy complicated by CKD.  Today she reports no complaints.  Depression screen [facility] 2/9 11/02/2019 08/01/2019 08/01/2019  Decreased Interest 0 0 0  Down, Depressed, Hopeless 0 0 0  PHQ - 2 Score 0 0 0  Altered sleeping 0 0 0  Tired, decreased energy 0 2 2  Change in appetite 0 2 2  Feeling bad or failure about yourself  0 0 0  Trouble concentrating 0 0 0  Moving slowly or fidgety/restless 0 0 0  Suicidal thoughts 0 0 0  PHQ-9 Score 0 4 4    Contractions: Not present. Vag. Bleeding: None.  Movement: Present. denies leaking of fluid.  Review of Systems:   Pertinent items are noted in HPI Denies abnormal vaginal discharge w/ itching/odor/irritation, headaches, visual changes, shortness of breath, chest pain, abdominal pain, severe nausea/vomiting, or problems with urination or bowel movements unless otherwise stated above. Pertinent History Reviewed:  Reviewed past medical,surgical, social, obstetrical and family history.  Reviewed problem list, medications and allergies. Physical Assessment:   Vitals:   01/16/20 1108  BP: 128/82  Pulse: 92  Weight: 187 lb (84.8 kg)  Body mass index is 30.18 kg/m.           Physical Examination:   General appearance: alert, well appearing, and in no distress  Mental status: alert, oriented to person, place, and time  Skin: warm & dry   Extremities: Edema: None    Cardiovascular: normal heart rate noted  Respiratory: normal respiratory effort, no distress  Abdomen: gravid, soft, non-tender  Pelvic: Cervical exam deferred         Fetal Status: Fetal Heart Rate (bpm): 130 Fundal Height: 36 cm Movement: Present     Fetal Surveillance Testing today: Reactive NST  Charlotte Smith is at [redacted]w[redacted]d Estimated Date of Delivery: 02/07/20  NST being performed due to CKD  Today the NST is Reactive  Fetal Monitoring:  Baseline: 130s bpm, Variability: Good {> 6 bpm), Accelerations: Reactive and Decelerations: Absent   reactive  The accelerations are >15 bpm and more than 2 in 20 minutes  Final diagnosis:  Reactive NST  Lazaro Arms, MD      Chaperone: N/A    Results for orders placed or performed in visit on 01/16/20 (from the past 24 hour(s))  POC Urinalysis Dipstick OB   Collection Time: 01/16/20 11:10 AM  Result Value Ref Range   Color, UA     Clarity, UA     Glucose, UA Negative Negative   Bilirubin, UA     Ketones, UA neg    Spec Grav, UA     Blood, UA 2+    pH, UA     POC,PROTEIN,UA Moderate (2+) Negative, Trace, Small (1+), Moderate (2+), Large (3+), 4+   Urobilinogen, UA     Nitrite, UA neg    Leukocytes, UA Negative Negative   Appearance     Odor      Assessment & Plan:  High-risk pregnancy: G2P1001 at [redacted]w[redacted]d with an Estimated Date of Delivery: 02/07/20   1) CKD, stable, BP is good, protein stable range, MFM recommends delivery 37th week    Meds: No orders of the defined  types were placed in this encounter.   Labs/procedures today: Reactive NST  Treatment Plan:  Repeat caesarean section 01/19/20 + BTL  Reviewed: Term labor symptoms and general obstetric precautions including but not limited to vaginal bleeding, contractions, leaking of fluid and fetal movement were reviewed in detail with the patient.  All questions were answered.  home bp cuff. Rx faxed to . Check bp weekly, let us know if >140/90.   Follow-up: Return in about 11 days (around 01/27/2020) for Post Op, with Dr Despina Hidden.   Future Appointments  Date Time Provider Department Center  01/17/2020  9:00 AM [facility]-LD PAT 1 [facility] None  01/17/2020 10:00 AM [facility]-SCREENING [facility] None  02/20/2020  1:45 PM [name],  PA-C [facility]    Orders Placed This Encounter  Procedures  . POC Urinalysis Dipstick OB   Lazaro Arms  01/16/2020 12:06 PM

## 2020-01-17 ENCOUNTER — Other Ambulatory Visit: Payer: Self-pay

## 2020-01-17 ENCOUNTER — Encounter (HOSPITAL_COMMUNITY)
Admission: RE | Admit: 2020-01-17 | Discharge: 2020-01-17 | Disposition: A | Discharge status: Home / Self Care | Payer: No Typology Code available for payment source | Source: Ambulatory Visit | Attending: Obstetrics & Gynecology | Admitting: Obstetrics & Gynecology

## 2020-01-17 ENCOUNTER — Encounter (HOSPITAL_COMMUNITY): Payer: Self-pay

## 2020-01-17 ENCOUNTER — Other Ambulatory Visit (HOSPITAL_COMMUNITY)
Admission: RE | Admit: 2020-01-17 | Discharge: 2020-01-17 | Disposition: A | Discharge status: Home / Self Care | Payer: No Typology Code available for payment source | Source: Ambulatory Visit | Attending: Obstetrics & Gynecology | Admitting: Obstetrics & Gynecology

## 2020-01-17 DIAGNOSIS — Z01812 Encounter for preprocedural laboratory examination: Secondary | ICD-10-CM | POA: Insufficient documentation

## 2020-01-17 DIAGNOSIS — Z20822 Contact with and (suspected) exposure to covid-19: Secondary | ICD-10-CM | POA: Insufficient documentation

## 2020-01-17 LAB — CBC
HCT: 34.6 % — ABNORMAL LOW (ref 36.0–46.0)
Hemoglobin: 11.3 g/dL — ABNORMAL LOW (ref 12.0–15.0)
MCH: 26.8 pg (ref 26.0–34.0)
MCHC: 32.7 g/dL (ref 30.0–36.0)
MCV: 82 fL (ref 80.0–100.0)
Platelets: 237 10*3/uL (ref 150–400)
RBC: 4.22 MIL/uL (ref 3.87–5.11)
RDW: 13.1 % (ref 11.5–15.5)
WBC: 9.7 10*3/uL (ref 4.0–10.5)
nRBC: 0 % (ref 0.0–0.2)

## 2020-01-17 LAB — COMPREHENSIVE METABOLIC PANEL
ALT: 18 U/L (ref 0–44)
AST: 21 U/L (ref 15–41)
Albumin: 2.7 g/dL — ABNORMAL LOW (ref 3.5–5.0)
Alkaline Phosphatase: 118 U/L (ref 38–126)
Anion gap: 10 (ref 5–15)
BUN: 8 mg/dL (ref 6–20)
CO2: 22 mmol/L (ref 22–32)
Calcium: 9.2 mg/dL (ref 8.9–10.3)
Chloride: 101 mmol/L (ref 98–111)
Creatinine, Ser: 1.11 mg/dL — ABNORMAL HIGH (ref 0.44–1.00)
GFR, Estimated: >60 mL/min (ref >60)
Glucose, Bld: 80 mg/dL (ref 70–99)
Potassium: 3.9 mmol/L (ref 3.5–5.1)
Sodium: 133 mmol/L — ABNORMAL LOW (ref 135–145)
Total Bilirubin: 0.3 mg/dL (ref 0.3–1.2)
Total Protein: 6.2 g/dL — ABNORMAL LOW (ref 6.5–8.1)

## 2020-01-17 LAB — TYPE AND SCREEN
ABO/RH(D): A POS
Antibody Screen: NEGATIVE

## 2020-01-17 LAB — RAPID HIV SCREEN (HIV 1/2 AB+AG)
HIV 1/2 Antibodies: NONREACTIVE
HIV-1 P24 Antigen - HIV24: NONREACTIVE

## 2020-01-17 LAB — SARS CORONAVIRUS 2 (TAT 6-24 HRS): SARS Coronavirus 2: NEGATIVE

## 2020-01-18 LAB — RPR: RPR Ser Ql: NONREACTIVE

## 2020-01-19 ENCOUNTER — Encounter (HOSPITAL_COMMUNITY): Payer: Self-pay | Admitting: Obstetrics & Gynecology

## 2020-01-19 ENCOUNTER — Other Ambulatory Visit: Payer: Self-pay

## 2020-01-19 ENCOUNTER — Inpatient Hospital Stay (HOSPITAL_COMMUNITY)
Admission: RE | Admit: 2020-01-19 | Discharge: 2020-01-21 | DRG: 785 | Disposition: A | Discharge status: Home / Self Care | Payer: No Typology Code available for payment source | Attending: Obstetrics & Gynecology | Admitting: Obstetrics & Gynecology

## 2020-01-19 ENCOUNTER — Inpatient Hospital Stay (HOSPITAL_COMMUNITY): Payer: No Typology Code available for payment source | Admitting: Anesthesiology

## 2020-01-19 ENCOUNTER — Encounter (HOSPITAL_COMMUNITY)
Admission: RE | Disposition: A | Discharge status: Home / Self Care | Payer: Self-pay | Source: Home / Self Care | Attending: Obstetrics & Gynecology

## 2020-01-19 DIAGNOSIS — Z87440 Personal history of urinary (tract) infections: Secondary | ICD-10-CM | POA: Diagnosis not present

## 2020-01-19 DIAGNOSIS — O99892 Other specified diseases and conditions complicating childbirth: Secondary | ICD-10-CM | POA: Diagnosis present

## 2020-01-19 DIAGNOSIS — Z3A37 37 weeks gestation of pregnancy: Secondary | ICD-10-CM

## 2020-01-19 DIAGNOSIS — Z20822 Contact with and (suspected) exposure to covid-19: Secondary | ICD-10-CM | POA: Diagnosis present

## 2020-01-19 DIAGNOSIS — O34211 Maternal care for low transverse scar from previous cesarean delivery: Principal | ICD-10-CM | POA: Diagnosis present

## 2020-01-19 DIAGNOSIS — Z302 Encounter for sterilization: Secondary | ICD-10-CM | POA: Diagnosis not present

## 2020-01-19 DIAGNOSIS — Z98891 History of uterine scar from previous surgery: Secondary | ICD-10-CM

## 2020-01-19 DIAGNOSIS — N189 Chronic kidney disease, unspecified: Secondary | ICD-10-CM | POA: Diagnosis present

## 2020-01-19 DIAGNOSIS — O2340 Unspecified infection of urinary tract in pregnancy, unspecified trimester: Secondary | ICD-10-CM | POA: Diagnosis present

## 2020-01-19 DIAGNOSIS — O26833 Pregnancy related renal disease, third trimester: Secondary | ICD-10-CM

## 2020-01-19 DIAGNOSIS — O2300 Infections of kidney in pregnancy, unspecified trimester: Secondary | ICD-10-CM | POA: Diagnosis present

## 2020-01-19 LAB — URINALYSIS, ROUTINE W REFLEX MICROSCOPIC
Bilirubin Urine: NEGATIVE
Glucose, UA: NEGATIVE mg/dL
Ketones, ur: NEGATIVE mg/dL
Leukocytes,Ua: NEGATIVE
Nitrite: NEGATIVE
Protein, ur: 100 mg/dL — AB
Specific Gravity, Urine: 1.01 (ref 1.005–1.030)
pH: 6 (ref 5.0–8.0)

## 2020-01-19 LAB — CBC
HCT: 33 % — ABNORMAL LOW (ref 36.0–46.0)
Hemoglobin: 11.4 g/dL — ABNORMAL LOW (ref 12.0–15.0)
MCH: 28.4 pg (ref 26.0–34.0)
MCHC: 34.5 g/dL (ref 30.0–36.0)
MCV: 82.3 fL (ref 80.0–100.0)
Platelets: 232 10*3/uL (ref 150–400)
RBC: 4.01 MIL/uL (ref 3.87–5.11)
RDW: 13.2 % (ref 11.5–15.5)
WBC: 18.3 10*3/uL — ABNORMAL HIGH (ref 4.0–10.5)
nRBC: 0 % (ref 0.0–0.2)

## 2020-01-19 LAB — CREATININE, SERUM
Creatinine, Ser: 1.09 mg/dL — ABNORMAL HIGH (ref 0.44–1.00)
GFR, Estimated: >60 mL/min (ref >60)

## 2020-01-19 SURGERY — Surgical Case
Anesthesia: Spinal | Wound class: Clean Contaminated

## 2020-01-19 MED ORDER — DEXAMETHASONE SODIUM PHOSPHATE 10 MG/ML IJ SOLN
INTRAMUSCULAR | Status: AC
  Filled 2020-01-19: qty 1

## 2020-01-19 MED ORDER — ONDANSETRON HCL 4 MG/2ML IJ SOLN: 4.0000 mg | Freq: Three times a day (TID) | INTRAMUSCULAR | Status: DC | PRN

## 2020-01-19 MED ORDER — ACETAMINOPHEN 10 MG/ML IV SOLN
1000.0000 mg | Freq: Once | INTRAVENOUS | Status: DC | PRN
  Administered 2020-01-19: 1000 mg via INTRAVENOUS

## 2020-01-19 MED ORDER — DIPHENHYDRAMINE HCL 25 MG PO CAPS: 25.0000 mg | ORAL_CAPSULE | ORAL | Status: DC | PRN

## 2020-01-19 MED ORDER — NALBUPHINE HCL 10 MG/ML IJ SOLN
INTRAMUSCULAR | Status: AC
  Filled 2020-01-19: qty 1

## 2020-01-19 MED ORDER — PHENYLEPHRINE HCL-NACL 20-0.9 MG/250ML-% IV SOLN
INTRAVENOUS | Status: AC
  Filled 2020-01-19: qty 250

## 2020-01-19 MED ORDER — SIMETHICONE 80 MG PO CHEW: 80.0000 mg | CHEWABLE_TABLET | ORAL | Status: DC | PRN

## 2020-01-19 MED ORDER — MORPHINE SULFATE (PF) 0.5 MG/ML IJ SOLN
INTRAMUSCULAR | Status: DC | PRN
  Administered 2020-01-19: .15 mg via INTRATHECAL

## 2020-01-19 MED ORDER — ACETAMINOPHEN 500 MG PO TABS
1000.0000 mg | ORAL_TABLET | Freq: Four times a day (QID) | ORAL | Status: DC
  Administered 2020-01-20 – 2020-01-21 (×5): 1000 mg via ORAL
  Filled 2020-01-19 (×5): qty 2

## 2020-01-19 MED ORDER — CEFAZOLIN SODIUM-DEXTROSE 2-4 GM/100ML-% IV SOLN
INTRAVENOUS | Status: AC
  Filled 2020-01-19: qty 100

## 2020-01-19 MED ORDER — ONDANSETRON HCL 4 MG/2ML IJ SOLN
INTRAMUSCULAR | Status: AC
  Filled 2020-01-19: qty 2

## 2020-01-19 MED ORDER — SENNOSIDES-DOCUSATE SODIUM 8.6-50 MG PO TABS
2.0000 | ORAL_TABLET | Freq: Every day | ORAL | Status: DC
  Administered 2020-01-20 – 2020-01-21 (×2): 2 via ORAL
  Filled 2020-01-19 (×2): qty 2

## 2020-01-19 MED ORDER — OXYCODONE HCL 5 MG PO TABS
5.0000 mg | ORAL_TABLET | Freq: Once | ORAL | Status: AC
  Administered 2020-01-19: 17:00:00 5 mg via ORAL
  Filled 2020-01-19: qty 1

## 2020-01-19 MED ORDER — CEFAZOLIN SODIUM-DEXTROSE 2-4 GM/100ML-% IV SOLN
2.0000 g | INTRAVENOUS | Status: AC
  Administered 2020-01-19: 07:00:00 2 g via INTRAVENOUS

## 2020-01-19 MED ORDER — PRENATAL MULTIVITAMIN CH
1.0000 | ORAL_TABLET | Freq: Every day | ORAL | Status: DC
  Administered 2020-01-20 – 2020-01-21 (×2): 1 via ORAL
  Filled 2020-01-19 (×2): qty 1

## 2020-01-19 MED ORDER — FENTANYL CITRATE (PF) 100 MCG/2ML IJ SOLN
25.0000 ug | INTRAMUSCULAR | Status: DC | PRN
Start: 2020-01-19 — End: 2020-01-19

## 2020-01-19 MED ORDER — ACETAMINOPHEN 500 MG PO TABS
1000.0000 mg | ORAL_TABLET | Freq: Four times a day (QID) | ORAL | Status: AC
  Administered 2020-01-19 – 2020-01-20 (×3): 1000 mg via ORAL
  Filled 2020-01-19 (×3): qty 2

## 2020-01-19 MED ORDER — DIBUCAINE (PERIANAL) 1 % EX OINT: 1.0000 "application " | TOPICAL_OINTMENT | CUTANEOUS | Status: DC | PRN

## 2020-01-19 MED ORDER — SODIUM CHLORIDE 0.9 % IV SOLN: INTRAVENOUS | Status: DC

## 2020-01-19 MED ORDER — POVIDONE-IODINE 10 % EX SWAB: 2.0000 "application " | Freq: Once | CUTANEOUS | Status: DC

## 2020-01-19 MED ORDER — COCONUT OIL OIL: 1.0000 "application " | TOPICAL_OIL | Status: DC | PRN

## 2020-01-19 MED ORDER — NALOXONE HCL 4 MG/10ML IJ SOLN
1.0000 ug/kg/h | INTRAVENOUS | Status: DC | PRN
  Filled 2020-01-19: qty 5

## 2020-01-19 MED ORDER — TETANUS-DIPHTH-ACELL PERTUSSIS 5-2.5-18.5 LF-MCG/0.5 IM SUSY: 0.5000 mL | PREFILLED_SYRINGE | Freq: Once | INTRAMUSCULAR | Status: DC

## 2020-01-19 MED ORDER — NALBUPHINE HCL 10 MG/ML IJ SOLN
5.0000 mg | Freq: Once | INTRAMUSCULAR | Status: AC | PRN
Start: 2020-01-19 — End: 2020-01-19

## 2020-01-19 MED ORDER — PHENYLEPHRINE HCL (PRESSORS) 10 MG/ML IV SOLN
INTRAVENOUS | Status: DC | PRN
  Administered 2020-01-19: 40 ug via INTRAVENOUS

## 2020-01-19 MED ORDER — FENTANYL CITRATE (PF) 100 MCG/2ML IJ SOLN
INTRAMUSCULAR | Status: DC | PRN
  Administered 2020-01-19: 15 ug via INTRATHECAL

## 2020-01-19 MED ORDER — SCOPOLAMINE 1 MG/3DAYS TD PT72: 1.0000 | MEDICATED_PATCH | Freq: Once | TRANSDERMAL | Status: DC

## 2020-01-19 MED ORDER — ONDANSETRON HCL 4 MG/2ML IJ SOLN
INTRAMUSCULAR | Status: DC | PRN
  Administered 2020-01-19: 4 mg via INTRAVENOUS

## 2020-01-19 MED ORDER — SCOPOLAMINE 1 MG/3DAYS TD PT72
1.0000 | MEDICATED_PATCH | TRANSDERMAL | Status: DC
  Administered 2020-01-19: 07:00:00 1.5 mg via TRANSDERMAL

## 2020-01-19 MED ORDER — SIMETHICONE 80 MG PO CHEW
80.0000 mg | CHEWABLE_TABLET | Freq: Three times a day (TID) | ORAL | Status: DC
  Administered 2020-01-19 – 2020-01-21 (×6): 80 mg via ORAL
  Filled 2020-01-19 (×6): qty 1

## 2020-01-19 MED ORDER — ENOXAPARIN SODIUM 40 MG/0.4ML ~~LOC~~ SOLN
40.0000 mg | SUBCUTANEOUS | Status: DC
  Administered 2020-01-20 – 2020-01-21 (×2): 40 mg via SUBCUTANEOUS
  Filled 2020-01-19 (×2): qty 0.4

## 2020-01-19 MED ORDER — FENTANYL CITRATE (PF) 100 MCG/2ML IJ SOLN
INTRAMUSCULAR | Status: AC
  Filled 2020-01-19: qty 2

## 2020-01-19 MED ORDER — NALBUPHINE HCL 10 MG/ML IJ SOLN
5.0000 mg | INTRAMUSCULAR | Status: DC | PRN
Start: 2020-01-19 — End: 2020-01-21
  Administered 2020-01-19 (×2): 5 mg via INTRAVENOUS
  Filled 2020-01-19 (×2): qty 1

## 2020-01-19 MED ORDER — DIPHENHYDRAMINE HCL 25 MG PO CAPS: 25.0000 mg | ORAL_CAPSULE | Freq: Four times a day (QID) | ORAL | Status: DC | PRN

## 2020-01-19 MED ORDER — OXYCODONE HCL 5 MG PO TABS
5.0000 mg | ORAL_TABLET | ORAL | Status: DC | PRN
Start: 2020-01-19 — End: 2020-01-21
  Administered 2020-01-19: 5 mg via ORAL
  Administered 2020-01-20 – 2020-01-21 (×6): 10 mg via ORAL
  Filled 2020-01-19 (×6): qty 2
  Filled 2020-01-19: qty 1

## 2020-01-19 MED ORDER — SCOPOLAMINE 1 MG/3DAYS TD PT72
MEDICATED_PATCH | TRANSDERMAL | Status: AC
  Filled 2020-01-19: qty 1

## 2020-01-19 MED ORDER — OXYTOCIN-SODIUM CHLORIDE 30-0.9 UT/500ML-% IV SOLN: 2.5000 [IU]/h | INTRAVENOUS | Status: AC

## 2020-01-19 MED ORDER — OXYTOCIN-SODIUM CHLORIDE 30-0.9 UT/500ML-% IV SOLN
INTRAVENOUS | Status: DC | PRN
  Administered 2020-01-19: 700 mL/h via INTRAVENOUS

## 2020-01-19 MED ORDER — LACTATED RINGERS IV SOLN: INTRAVENOUS | Status: DC

## 2020-01-19 MED ORDER — MENTHOL 3 MG MT LOZG: 1.0000 | LOZENGE | OROMUCOSAL | Status: DC | PRN

## 2020-01-19 MED ORDER — BUPIVACAINE IN DEXTROSE 0.75-8.25 % IT SOLN
INTRATHECAL | Status: DC | PRN
  Administered 2020-01-19: 1.8 mL via INTRATHECAL

## 2020-01-19 MED ORDER — NALBUPHINE HCL 10 MG/ML IJ SOLN
5.0000 mg | Freq: Once | INTRAMUSCULAR | Status: AC | PRN
  Administered 2020-01-19: 5 mg via INTRAVENOUS

## 2020-01-19 MED ORDER — NALBUPHINE HCL 10 MG/ML IJ SOLN: 5.0000 mg | INTRAMUSCULAR | Status: DC | PRN

## 2020-01-19 MED ORDER — PHENYLEPHRINE HCL-NACL 20-0.9 MG/250ML-% IV SOLN
INTRAVENOUS | Status: DC | PRN
  Administered 2020-01-19: 60 ug/min via INTRAVENOUS

## 2020-01-19 MED ORDER — GABAPENTIN 100 MG PO CAPS
100.0000 mg | ORAL_CAPSULE | Freq: Three times a day (TID) | ORAL | Status: DC
  Administered 2020-01-19 – 2020-01-21 (×7): 100 mg via ORAL
  Filled 2020-01-19 (×7): qty 1

## 2020-01-19 MED ORDER — NALOXONE HCL 0.4 MG/ML IJ SOLN: 0.4000 mg | INTRAMUSCULAR | Status: DC | PRN

## 2020-01-19 MED ORDER — DIPHENHYDRAMINE HCL 50 MG/ML IJ SOLN: 12.5000 mg | INTRAMUSCULAR | Status: DC | PRN

## 2020-01-19 MED ORDER — SODIUM CHLORIDE 0.9% FLUSH: 3.0000 mL | INTRAVENOUS | Status: DC | PRN

## 2020-01-19 MED ORDER — PHENYLEPHRINE 40 MCG/ML (10ML) SYRINGE FOR IV PUSH (FOR BLOOD PRESSURE SUPPORT)
PREFILLED_SYRINGE | INTRAVENOUS | Status: AC
  Filled 2020-01-19: qty 10

## 2020-01-19 MED ORDER — OXYTOCIN-SODIUM CHLORIDE 30-0.9 UT/500ML-% IV SOLN
INTRAVENOUS | Status: AC
  Filled 2020-01-19: qty 500

## 2020-01-19 MED ORDER — WITCH HAZEL-GLYCERIN EX PADS: 1.0000 "application " | MEDICATED_PAD | CUTANEOUS | Status: DC | PRN

## 2020-01-19 MED ORDER — MORPHINE SULFATE (PF) 0.5 MG/ML IJ SOLN
INTRAMUSCULAR | Status: AC
  Filled 2020-01-19: qty 10

## 2020-01-19 MED ORDER — ACETAMINOPHEN 10 MG/ML IV SOLN
INTRAVENOUS | Status: AC
  Filled 2020-01-19: qty 100

## 2020-01-19 MED ORDER — DEXAMETHASONE SODIUM PHOSPHATE 10 MG/ML IJ SOLN
INTRAMUSCULAR | Status: DC | PRN
  Administered 2020-01-19: 8 mg via INTRAVENOUS

## 2020-01-19 SURGICAL SUPPLY — 39 items
CHLORAPREP W/TINT 26ML (MISCELLANEOUS) ×2 IMPLANT
CLAMP CORD UMBIL (MISCELLANEOUS) ×2 IMPLANT
CLOTH BEACON ORANGE TIMEOUT ST (SAFETY) ×2 IMPLANT
DERMABOND ADHESIVE PROPEN (GAUZE/BANDAGES/DRESSINGS) ×1
DERMABOND ADVANCED (GAUZE/BANDAGES/DRESSINGS) ×2
DERMABOND ADVANCED .7 DNX12 (GAUZE/BANDAGES/DRESSINGS) ×2 IMPLANT
DERMABOND ADVANCED .7 DNX6 (GAUZE/BANDAGES/DRESSINGS) ×1 IMPLANT
DRSG OPSITE POSTOP 4X10 (GAUZE/BANDAGES/DRESSINGS) ×2 IMPLANT
ELECT REM PT RETURN 9FT ADLT (ELECTROSURGICAL) ×2
ELECTRODE REM PT RTRN 9FT ADLT (ELECTROSURGICAL) ×1 IMPLANT
EXTRACTOR VACUUM BELL STYLE (SUCTIONS) IMPLANT
GLOVE BIOGEL PI IND STRL 7.0 (GLOVE) ×1 IMPLANT
GLOVE BIOGEL PI IND STRL 8 (GLOVE) ×1 IMPLANT
GLOVE BIOGEL PI INDICATOR 7.0 (GLOVE) ×1
GLOVE BIOGEL PI INDICATOR 8 (GLOVE) ×1
GLOVE ECLIPSE 8.0 STRL XLNG CF (GLOVE) ×2 IMPLANT
GOWN STRL REUS W/TWL LRG LVL3 (GOWN DISPOSABLE) ×4 IMPLANT
KIT ABG SYR 3ML LUER SLIP (SYRINGE) ×2 IMPLANT
NEEDLE HYPO 18GX1.5 BLUNT FILL (NEEDLE) ×2 IMPLANT
NEEDLE HYPO 22GX1.5 SAFETY (NEEDLE) ×2 IMPLANT
NEEDLE HYPO 25X5/8 SAFETYGLIDE (NEEDLE) ×2 IMPLANT
NS IRRIG 1000ML POUR BTL (IV SOLUTION) ×2 IMPLANT
PACK C SECTION WH (CUSTOM PROCEDURE TRAY) ×2 IMPLANT
PAD OB MATERNITY 4.3X12.25 (PERSONAL CARE ITEMS) ×2 IMPLANT
PENCIL SMOKE EVAC W/HOLSTER (ELECTROSURGICAL) ×2 IMPLANT
RTRCTR C-SECT PINK 25CM LRG (MISCELLANEOUS) IMPLANT
SUT CHROMIC 0 CT 1 (SUTURE) ×2 IMPLANT
SUT MNCRL 0 VIOLET CTX 36 (SUTURE) ×2 IMPLANT
SUT MONOCRYL 0 CTX 36 (SUTURE) ×4
SUT PLAIN 2 0 (SUTURE) ×4
SUT PLAIN 2 0 XLH (SUTURE) IMPLANT
SUT PLAIN ABS 2-0 CT1 27XMFL (SUTURE) ×2 IMPLANT
SUT VIC AB 0 CTX 36 (SUTURE) ×2
SUT VIC AB 0 CTX36XBRD ANBCTRL (SUTURE) ×1 IMPLANT
SUT VIC AB 4-0 KS 27 (SUTURE) ×2 IMPLANT
SYR 20CC LL (SYRINGE) ×4 IMPLANT
TOWEL OR 17X24 6PK STRL BLUE (TOWEL DISPOSABLE) ×2 IMPLANT
TRAY FOLEY W/BAG SLVR 14FR LF (SET/KITS/TRAYS/PACK) IMPLANT
WATER STERILE IRR 1000ML POUR (IV SOLUTION) ×2 IMPLANT

## 2020-01-19 NOTE — H&P (Signed)
OBSTETRIC ADMISSION HISTORY AND PHYSICAL  Charlotte Smith is a 24 y.o. female G2P1001 with IUP at [redacted]w[redacted]d by L/9 presenting for scheduled repeat Cesarean. She reports +FMs, No LOF, no VB, no blurry vision, headaches or peripheral edema, and RUQ pain.  She plans on breast feeding. She requests postpartum BTL for birth control.  She received her prenatal care at [facility]   Dating: By L/9 --->  Estimated Date of Delivery: 02/07/20  Sono:  @[redacted]w[redacted]d , CWD, normal anatomy, cephalic presentation,  2578g, 62.9% EFW   Prenatal History/Complications:  - h/o Cesarean x1 -chronic renal disease (R kidney deteriorated, proteinuria, hematuria) -Pyelonephritis in current pregnancy -Persistent UTIs in pregnancy  Past Medical History: Past Medical History:  Diagnosis Date   Renal disorder    non-functioning left kidney   Tachycardia 01/06/2020    Past Surgical History: Past Surgical History:  Procedure Laterality Date   BLADDER SURGERY     CESAREAN SECTION N/A 10/30/2014   Procedure: CESAREAN SECTION;  Surgeon: 12/30/2014, MD;  Location: [facility];  Service: Obstetrics;  Laterality: N/A;    Obstetrical History: OB History    Gravida  2   Para  1   Term  1   Preterm      AB  0   Living  1     SAB  0   IAB      Ectopic      Multiple  0   Live Births  1           Social History Social History   Socioeconomic History   Marital status: Married    Spouse name: Not on file   Number of children: 1   Years of education: Not on file   Highest education level: Not on file  Occupational History   Not on file  Tobacco Use   Smoking status: Never Smoker   Smokeless tobacco: Never Used  Vaping Use   Vaping Use: Never used  Substance and Sexual Activity   Alcohol use: No   Drug use: No   Sexual activity: Yes    Birth control/protection: None  Other Topics Concern   Not on file  Social History Narrative   Not on file   Social Determinants of Health    Financial Resource Strain: Low Risk    Difficulty of Paying Living Expenses: Not hard at all  Food Insecurity: No Food Insecurity   Worried About [place] in the Last Year: Never true   Ran Out of Food in the Last Year: Never true  Transportation Needs: No Transportation Needs   Lack of Transportation (Medical): No   Lack of Transportation (Non-Medical): No  Physical Activity: Insufficiently Active   Days of Exercise per Week: 3 days   Minutes of Exercise per Session: 40 min  Stress: No Stress Concern Present   Feeling of Stress : Not at all  Social Connections: Moderately Integrated   Frequency of Communication with Friends and Family: Three times a week   Frequency of Social Gatherings with Friends and Family: Once a week   Attends Religious Services: 1 to 4 times per year   Active Member of Programme researcher, broadcasting/film/video or Organizations: No   Attends [employer] Meetings: Never   Marital Status: Married    Family History: Family History  Problem Relation Age of Onset   Depression Mother    Hypertension Father    Cancer Maternal Grandmother        lung  Cancer Paternal Grandfather        testicular, lung    Allergies: No Known Allergies  Medications Prior to Admission  Medication Sig Dispense Refill Last Dose   Cephalexin (KEFLEX PO) Take by mouth daily.      omeprazole (PRILOSEC) 10 MG capsule Take 1 bid 60 capsule 3    Pediatric Multivitamins-Iron (FLINTSTONES COMPLETE PO) Take 2 tablets by mouth.        Review of Systems   All systems reviewed and negative except as stated in HPI  Blood pressure 125/78, pulse 99, temperature 98.1 F (36.7 C), temperature source Oral, resp. rate 18, height 5\' 6"  (1.676 m), weight 85.8 kg, last menstrual period 05/03/2019, SpO2 100 %. General appearance: alert, cooperative and appears stated age Lungs: normal WOB Heart: regular rate Abdomen: soft, non-tender Extremities: no sign of DVT Presentation:  cephalic Fetal monitoring: FHT 132 on Doppler     Prenatal labs: ABO, Rh: --/--/A POS (12/28 03-05-1980) Antibody: NEG (12/28 0903) Rubella: 4.77 (07/12 1559) RPR: NON REACTIVE (12/28 0930)  HBsAg: Negative (07/12 1559)  HIV: NON REACTIVE (12/28 0930)  GBS: Negative/-- (12/21 1639)  2 hr Glucola wnl Genetic screening  wnl Anatomy 05-18-1997 wnl  Prenatal Transfer Tool  Maternal Diabetes: No Genetic Screening: Normal Maternal Ultrasounds/Referrals: Normal Fetal Ultrasounds or other Referrals:  None Maternal Substance Abuse:  No Significant Maternal Medications:  Meds include: Other:  keflex Significant Maternal Lab Results: Group B Strep negative  Results for orders placed or performed during the hospital encounter of 01/19/20 (from the past 24 hour(s))  Urinalysis, Routine w reflex microscopic Urine, Clean Catch   Collection Time: 01/19/20  5:25 AM  Result Value Ref Range   Color, Urine YELLOW YELLOW   APPearance CLOUDY (A) CLEAR   Specific Gravity, Urine 1.010 1.005 - 1.030   pH 6.0 5.0 - 8.0   Glucose, UA NEGATIVE NEGATIVE mg/dL   Hgb urine dipstick MODERATE (A) NEGATIVE   Bilirubin Urine NEGATIVE NEGATIVE   Ketones, ur NEGATIVE NEGATIVE mg/dL   Protein, ur 01/21/20 (A) NEGATIVE mg/dL   Nitrite NEGATIVE NEGATIVE   Leukocytes,Ua NEGATIVE NEGATIVE   RBC / HPF 0-5 0 - 5 RBC/hpf   WBC, UA 6-10 0 - 5 WBC/hpf   Bacteria, UA FEW (A) NONE SEEN   Squamous Epithelial / LPF 11-20 0 - 5   Mucus PRESENT     Patient Active Problem List   Diagnosis Date Noted   Previous cesarean section 01/19/2020   Tachycardia 01/06/2020   Pyelonephritis affecting pregnancy 10/07/2019   Persistent UTI during pregnancy 08/01/2019   Supervision of high risk pregnancy, antepartum 08/01/2019   History of cesarean delivery 07/21/2019   Chronic renal disease 03/13/2014    Assessment/Plan:  Charlotte Smith is a 24 y.o. G2P1001 at [redacted]w[redacted]d here for for scheduled repeat Cesarean.  #Scheduled Repeat  Cesarean: The risks of cesarean section were discussed with the patient including but were not limited to: bleeding which may require transfusion or reoperation; infection which may require antibiotics; injury to bowel, bladder, ureters or other surrounding organs; injury to the fetus; need for additional procedures including hysterectomy in the event of a life-threatening hemorrhage; placental abnormalities wth subsequent pregnancies, incisional problems, thromboembolic phenomenon and other postoperative/anesthesia complications.  Patient also desires permanent sterilization.  Other reversible forms of contraception were discussed with patient; she declines all other modalities. Risks of procedure discussed with patient including but not limited to: risk of regret, permanence of method, bleeding, infection, injury to surrounding organs and  need for additional procedures.  Failure risk of about 1% with increased risk of ectopic gestation if pregnancy occurs was also discussed with patient.  Also discussed possibility of post-tubal pain syndrome. The patient concurred with the proposed plan, giving informed written consent for the procedures.  Patient has been NPO since last night; she will remain NPO for procedure. Anesthesia and OR aware.  Preoperative prophylactic antibiotics and SCDs ordered on call to the OR.  To OR when ready.  #Pain: Per anesthesia. #FWB: FHT 132 on Doppler, +FM #ID: GBS neg; plan for cefazolin 2g prior to OR #MOF: breast #MOC:plan for postpartum BTL as noted above #Circ: desired  #H/o Pyelonephritis   Recurrent UTIs: suppressive keflex in pregnancy. Urinalysis with reflex culture obtained on admission.  Sheila Oats, MD  01/19/2020, 6:38 AM

## 2020-01-19 NOTE — Transfer of Care (Signed)
Immediate Anesthesia Transfer of Care Note  Patient: Charlotte Smith  Procedure(s) Performed: CESAREAN SECTION WITH BILATERAL TUBAL LIGATION (N/A )  Patient Location: PACU  Anesthesia Type:Spinal  Level of Consciousness: awake, alert  and patient cooperative  Airway & Oxygen Therapy: Patient Spontanous Breathing  Post-op Assessment: Report given to RN and Post -op Vital signs reviewed and stable  Post vital signs: Reviewed and stable  Last Vitals:  Vitals Value Taken Time  BP 128/80 01/19/20 0838  Temp    Pulse 90 01/19/20 0841  Resp 16 01/19/20 0841  SpO2 100 % 01/19/20 0841  Vitals shown include unvalidated device data.  Last Pain:  Vitals:   01/19/20 0545  TempSrc: Oral  PainSc: 0-No pain       Patient complaining of itching on upper body. See MAR.  Complications: No complications documented.

## 2020-01-19 NOTE — Anesthesia Postprocedure Evaluation (Signed)
Anesthesia Post Note  Patient: Charlotte Smith  Procedure(s) Performed: CESAREAN SECTION WITH BILATERAL TUBAL LIGATION (N/A )     Patient location during evaluation: PACU Anesthesia Type: Spinal Level of consciousness: oriented and awake and alert Pain management: pain level controlled Vital Signs Assessment: post-procedure vital signs reviewed and stable Respiratory status: spontaneous breathing, respiratory function stable and patient connected to nasal cannula oxygen Cardiovascular status: blood pressure returned to baseline and stable Postop Assessment: no headache, no backache and no apparent nausea or vomiting Anesthetic complications: no   No complications documented.  Last Vitals:  Vitals:   01/19/20 0947 01/19/20 1050  BP: 120/82 121/74  Pulse: 65 61  Resp: 14 16  Temp: 36.4 C 36.6 C  SpO2: 100%     Last Pain:  Vitals:   01/19/20 1050  TempSrc: Oral  PainSc: 0-No pain   Pain Goal: Patients Stated Pain Goal: 3 (01/19/20 0141)              Epidural/Spinal Function Cutaneous sensation: Able to Discern Pressure (01/19/20 1050), Patient able to flex knees: Yes (01/19/20 1050), Patient able to lift hips off bed: No (01/19/20 1050), Back pain beyond tenderness at insertion site: No (01/19/20 1050), Progressively worsening motor and/or sensory loss: No (01/19/20 1050), Bowel and/or bladder incontinence post epidural: No (01/19/20 1050)  Karder Goodin L Roseana Rhine

## 2020-01-19 NOTE — Anesthesia Procedure Notes (Signed)
Spinal  Patient location during procedure: OR Start time: 01/19/2020 7:20 AM End time: 01/19/2020 7:26 AM Staffing Performed: anesthesiologist  Anesthesiologist: Elmer Picker, MD Preanesthetic Checklist Completed: patient identified, IV checked, risks and benefits discussed, surgical consent, monitors and equipment checked, pre-op evaluation and timeout performed Spinal Block Patient position: sitting Prep: DuraPrep and site prepped and draped Patient monitoring: cardiac monitor, continuous pulse ox and blood pressure Approach: midline Location: L3-4 Injection technique: single-shot Needle Needle type: Pencan  Needle gauge: 24 G Needle length: 9 cm Assessment Sensory level: T6 Additional Notes Functioning IV was confirmed and monitors were applied. Sterile prep and drape, including hand hygiene and sterile gloves were used. The patient was positioned and the spine was prepped. The skin was anesthetized with lidocaine.  Free flow of clear CSF was obtained prior to injecting local anesthetic into the CSF.  The spinal needle aspirated freely following injection.  The needle was carefully withdrawn.  The patient tolerated the procedure well.

## 2020-01-19 NOTE — Op Note (Signed)
Cesarean Section Procedure Note   Charlotte Smith  01/19/2020  Indications: Elective Repeat Cesarean, Desire for Permanent Sterilization, Chronic Kidney Disease  Pre-operative Diagnosis: 37 plus weeks chronic renal disease-repeat cesarean section with bilateral tubal ligation.  Post-operative Diagnosis: 37 plus weeks chronic renal disease-repeat cesarean section with bilateral tubal ligation (bilateral fimbriectomy)  Surgeon: Surgeon(s) and Role:    * Eure, Amaryllis Dyke, MD - Primary    * Sheila Oats, MD - Fellow   Anesthesia: spinal    Estimated Blood Loss: 244 ml  Total IV Fluids:  1300 ml   Urine Output: 200 CC OF clear urine  Specimens: none  Findings: normal tubes and ovaries  Baby condition / location:  Couplet care / Skin to Skin 3390 gm APGAR: 9 & 9   Complications: no complications  Indications: Charlotte Smith is a 24 y.o. G2P2002 with an IUP [redacted]w[redacted]d presenting for scheduled repeat Cesarean in setting of chronic kidney disease.  The risks, benefits, complications, treatment options, and expected outcomes were discussed with the patient . The patient concurred with the proposed plan, giving informed consent. identified as Charlotte Smith and the procedure verified as C-Section Delivery.  Procedure Details: A Time Out was held and the above information confirmed.  The patient was taken back to the operative suite where spinal anesthesia was placed.  After induction of anesthesia, the patient was draped and prepped in the usual sterile manner and placed in a dorsal supine position with a leftward tilt. A low transverse incision was made and carried down through the subcutaneous tissue to the fascia. Fascial incision was made and extended transversely. The fascia was bluntly separated from the underlying rectus tissue superiorly and inferiorly. The peritoneum was identified and entered. Peritoneal incision was extended longitudinally. A bladder blade was inserted, and a  low transverse uterine incision was made. Delivered from cephalic presentation was a 3390 gram female infant with Apgar scores of 9 at one minute and 9 at five minutes. Cord ph was not sent the umbilical cord was clamped and cut cord blood was obtained for evaluation. The placenta was removed Intact and appeared normal. The uterine outline, tubes and ovaries appeared normal. The uterine incision was closed with running locked sutures of 0 monocryl. Hemostasis was observed.   The right Fallopian tube was identified by tracing out to the fimbraie, grasped with the Babcock clamps. Fimbriectomy was performed using the clamp, cut, ligate method. Good hemostasis was noted after. Attention was then turned to the left Fallopian tube, and a similar procedure was carried out after confirmation by tracing the tube out to the fimbriae.  Lavage was carried out until clear. The fascia was then reapproximated with running sutures of 0 Vicryl.  The skin was closed with 4-0 Vicryl.   Instrument, sponge, and needle counts were correct prior the abdominal closure and were correct at the conclusion of the case.   Disposition: PACU - hemodynamically stable.   Maternal Condition: stable   Signed: Sheila Oats, MD OB Fellow, [facility] 01/19/2020 9:23 AM

## 2020-01-19 NOTE — Discharge Instructions (Signed)

## 2020-01-19 NOTE — Lactation Note (Signed)
This note was copied from a baby's chart. Lactation Consultation Note  Patient Name: Charlotte Smith Date: 01/19/2020 Reason for consult: Initial assessment;Mother's request;Difficult latch;1st time breastfeeding;Early term 37-38.6wks;Other (Comment) (Chronic Kidney Disease prophylactic Keflex L1) Age:24 years  Infant sleepy at the breast. Mom taught breast massage and hand expression. Nipples are flat with some areola edema. Left nipple smaller than the right. Mom taught reveres pressure softening and a nipple role, nipples erect following.   LC attempted infant to latch on the right side in football but he did not have enough leg room. LC moved infant to the left side in cross cradle and infant latched. Infant has recessed chin, Mom instructed to make sure lips are flanged and to pull the lower jaw down.   Mom denied any pain with the latch. Mom has a Kozy hand free pump that she wants to use. LC instructed Mom to pre pump for five minutes to bring out her nipples before latching.   Plan 1. To feed based on cues 8-12 x in 24 hour period no more than 4 hours without an attempt          2. To place infant STS and look for signs of milk transfer.            3 Pump using personal pump q 3 hours for 15 minutes.           4 I and O sheet reviewed.             5 LC brochure of inpatient and outpatient services reviewed.

## 2020-01-19 NOTE — Anesthesia Preprocedure Evaluation (Signed)
Anesthesia Evaluation  Patient identified by MRN, date of birth, ID band Patient awake    Reviewed: Allergy & Precautions, NPO status , Patient's Chart, lab work & pertinent test results  Airway Mallampati: II  TM Distance: >3 FB Neck ROM: Full    Dental no notable dental hx.    Pulmonary neg pulmonary ROS,    Pulmonary exam normal breath sounds clear to auscultation       Cardiovascular negative cardio ROS Normal cardiovascular exam Rhythm:Regular Rate:Normal     Neuro/Psych negative neurological ROS  negative psych ROS   GI/Hepatic negative GI ROS, Neg liver ROS,   Endo/Other  negative endocrine ROS  Renal/GU Renal InsufficiencyRenal disease  negative genitourinary   Musculoskeletal negative musculoskeletal ROS (+)   Abdominal   Peds  Hematology negative hematology ROS (+)   Anesthesia Other Findings Repeat C/S  Reproductive/Obstetrics (+) Pregnancy                             Anesthesia Physical Anesthesia Plan  ASA: II  Anesthesia Plan: Spinal   Post-op Pain Management:    Induction:   PONV Risk Score and Plan: Treatment may vary due to age or medical condition  Airway Management Planned: Natural Airway  Additional Equipment:   Intra-op Plan:   Post-operative Plan:   Informed Consent: I have reviewed the patients History and Physical, chart, labs and discussed the procedure including the risks, benefits and alternatives for the proposed anesthesia with the patient or authorized representative who has indicated his/her understanding and acceptance.     Dental advisory given  Plan Discussed with: CRNA  Anesthesia Plan Comments:         Anesthesia Quick Evaluation

## 2020-01-19 NOTE — Discharge Summary (Signed)
Postpartum Discharge Summary  Date of Service updated 01/21/20    Patient Name: Charlotte Smith DOB: 08-29-1995 MRN: [id number]  Date of admission: 01/19/2020 Delivery date:01/19/2020  Delivering provider: Florian Buff  Date of discharge: 01/21/2020  Admitting diagnosis: Previous cesarean section [Z98.891] Cesarean delivery delivered [O82] Intrauterine pregnancy: [redacted]w[redacted]d     Secondary diagnosis:  Principal Problem:   Cesarean delivery delivered Active Problems:   Chronic renal disease   History of cesarean delivery   Persistent UTI during pregnancy   Pyelonephritis affecting pregnancy   Previous cesarean section  Additional problems: as noted above  Discharge diagnosis: Repeat Cesarean delivery delivered                                        Post partum procedures:postpartum tubal ligation Augmentation: N/A Complications: None  Hospital course: Sceduled C/S   24 y.o. yo G2P2002 at [redacted]w[redacted]d was admitted to the hospital 01/19/2020 for scheduled cesarean section with the following indication:Elective Repeat Cesarean with desire for permanent sterility in setting of chronic renal disease.Delivery details are as follows:  Membrane Rupture Time/Date: 7:54 AM ,01/19/2020   Delivery Method:C-Section, Low Transverse  Details of operation can be found in separate operative note.  Patient had an uncomplicated postpartum course.  She is ambulating, tolerating a regular diet, passing flatus, and urinating well. Patient is discharged home in stable condition on  01/21/20        Newborn Data: Birth date:01/19/2020  Birth time:7:54 AM  Gender:Female  Living status:Living  Apgars:9 ,9  Weight:3390 g     Magnesium Sulfate received: No BMZ received: No Rhophylac:N/A MMR:N/A T-DaP:Given prenatally Flu: Yes Transfusion:No  Physical exam  Vitals:   01/20/20 0325 01/20/20 1439 01/20/20 1959 01/21/20 0548  BP: 111/61 (!) 100/52 122/81 (!) 99/54  Pulse: 93 60 75 86  Resp: $Remo'18 16 17 16   'AdCcL$ Temp: 98.2 F (36.8 C) 98.2 F (36.8 C) 98.3 F (36.8 C) 98 F (36.7 C)  TempSrc: Oral Oral Oral Oral  SpO2: 98% 99% 97% 97%  Weight:      Height:       General: alert Lochia: appropriate Uterine Fundus: firm Incision: Healing well with no significant drainage, No significant erythema, Dressing is clean, dry, and intact DVT Evaluation: No evidence of DVT seen on physical exam. Negative Homan's sign. No cords or calf tenderness. No significant calf/ankle edema. Labs: Lab Results  Component Value Date   WBC 19.3 (H) 01/20/2020   HGB 9.5 (L) 01/20/2020   HCT 27.6 (L) 01/20/2020   MCV 81.7 01/20/2020   PLT 203 01/20/2020   CMP Latest Ref Rng & Units 01/19/2020  Glucose 70 - 99 mg/dL -  BUN 6 - 20 mg/dL -  Creatinine 0.44 - 1.00 mg/dL 1.09(H)  Sodium 135 - 145 mmol/L -  Potassium 3.5 - 5.1 mmol/L -  Chloride 98 - 111 mmol/L -  CO2 22 - 32 mmol/L -  Calcium 8.9 - 10.3 mg/dL -  Total Protein 6.5 - 8.1 g/dL -  Total Bilirubin 0.3 - 1.2 mg/dL -  Alkaline Phos 38 - 126 U/L -  AST 15 - 41 U/L -  ALT 0 - 44 U/L -   Edinburgh Score: Edinburgh Postnatal Depression Scale Screening Tool 01/20/2020  I have been able to laugh and see the funny side of things. 0  I have looked forward with enjoyment to things. 0  I have blamed myself unnecessarily when things went wrong. 0  I have been anxious or worried for no good reason. 1  I have felt scared or panicky for no good reason. 0  Things have been getting on top of me. 0  I have been so unhappy that I have had difficulty sleeping. 0  I have felt sad or miserable. 0  I have been so unhappy that I have been crying. 0  The thought of harming myself has occurred to me. 0  Edinburgh Postnatal Depression Scale Total 1     After visit meds:  Allergies as of 01/21/2020      Reactions   Ibuprofen Other (See Comments)   Reported hematemesis with schedule ibuprofen s/p first Cesarean.       Medication List    STOP taking these  medications   KEFLEX PO     TAKE these medications   FLINTSTONES COMPLETE PO Take 2 tablets by mouth.   omeprazole 10 MG capsule Commonly known as: PRILOSEC Take 1 bid   oxyCODONE 5 MG immediate release tablet Commonly known as: Oxy IR/ROXICODONE Take 1-2 tablets (5-10 mg total) by mouth every 4 (four) hours as needed for moderate pain.        Discharge home in stable condition Infant Feeding: Breast Infant Disposition:home with mother Discharge instruction: per After Visit Summary and Postpartum booklet. Activity: Advance as tolerated. Pelvic rest for 6 weeks.  Diet: routine diet Future Appointments: Future Appointments  Date Time Provider [place]  01/26/2020 11:20 AM Christin Fudge, CNM [facility]  02/20/2020  1:45 PM Darreld Mclean, PA-C [facility]  03/01/2020 11:30 AM Cresenzo-Dishmon, Joaquim Lai, CNM [facility]   Follow up Visit:  Follow-up Information    [facility] Follow up.   Specialty: Obstetrics and Gynecology Why: on 01/26/19 as scheduled for incision check Contact information: [place] [phone]             Message sent to [facility] on 01/19/20 to schedule PP appt.  Please schedule this patient for a In person postpartum visit in 6 weeks with the following provider: Any provider. Additional Postpartum F/U:Incision check 1 week  High risk pregnancy complicated by: chronic kidney disease, recurrent UTIs and pyelonepritis in pregnancy (supressive keflex in pregnancy) Delivery mode:  C-Section, Low Transverse  Anticipated Birth Control:  BTL done PP   01/21/2020 Roma Schanz, CNM

## 2020-01-20 LAB — CBC
HCT: 27.6 % — ABNORMAL LOW (ref 36.0–46.0)
Hemoglobin: 9.5 g/dL — ABNORMAL LOW (ref 12.0–15.0)
MCH: 28.1 pg (ref 26.0–34.0)
MCHC: 34.4 g/dL (ref 30.0–36.0)
MCV: 81.7 fL (ref 80.0–100.0)
Platelets: 203 10*3/uL (ref 150–400)
RBC: 3.38 MIL/uL — ABNORMAL LOW (ref 3.87–5.11)
RDW: 13.3 % (ref 11.5–15.5)
WBC: 19.3 10*3/uL — ABNORMAL HIGH (ref 4.0–10.5)
nRBC: 0 % (ref 0.0–0.2)

## 2020-01-20 LAB — BIRTH TISSUE RECOVERY COLLECTION (PLACENTA DONATION)

## 2020-01-20 NOTE — Progress Notes (Signed)
Post-Op Day 1, repeat CS for elective w/CKD  Subjective: No complaints, up ad lib, voiding and tolerating PO, passing flatus,small lochia,.plans to breastfeed, bilateral tubal ligation  Objective: Blood pressure 111/61, pulse 93, temperature 98.2 F (36.8 C), temperature source Oral, resp. rate 18, height 5\' 6"  (1.676 m), weight 85.8 kg, last menstrual period 05/03/2019, SpO2 98 %, unknown if currently breastfeeding.  Physical Exam:  General: alert, cooperative and no distress Lochia:normal flow Chest: CTAB Heart: RRR no m/r/g Abdomen: +BS, soft, nontender, dsg c/d/intact, no erythema Uterine Fundus: firm DVT Evaluation: No evidence of DVT seen on physical exam. Extremities: no edema  Recent Labs    01/19/20 1015 01/20/20 0457  HGB 11.4* 9.5*  HCT 33.0* 27.6*    Assessment/Plan: Breastfeeding, Lactation consult and Circumcision prior to discharge  CKD:  Labs stable Anemia:  FeSO4 QOD   LOS: 1 day   01/22/20 01/20/2020, 7:13 AM

## 2020-01-20 NOTE — Lactation Note (Signed)
This note was copied from a baby's chart. Lactation Consultation Note  Patient Name: Charlotte Smith ASNKN'L Date: 01/20/2020 Reason for consult: Follow-up assessment Age:26 hours   Staff nurse request that I see Mother as assist with her personal pump Momcozy pump. Mother reports that she used her pump all day yesterday and wasn't having any problems. She reports that today she was feeling some pain with the flange.   When I arrived Mother reports that the flange is no longer hurting.  LC informed mother that I was not familiar with the pump , but that if it happens again, I would turn the pressure down and always check flange placement and bra tightness. Mother reports that she filled up the chamber with colostrum yesterday and was having good success with the pump.   Mother was sitting on the side of the bed breastfeeding infant.  I suggested that mother would be more comfortable with pillow support under baby and to back, maybe sitting in the chair or bed.  Offered assistance and husband said he would help her. Mother denied having any other concerns or breast feeding questions.   Maternal Data    Feeding Feeding Type: Breast Milk  LATCH Score                   Interventions    Lactation Tools Discussed/Used     Consult Status Consult Status: Follow-up Date: 01/21/20 Follow-up type: In-patient    Stevan Born [facility] 01/20/2020, 1:25 PM

## 2020-01-21 MED ORDER — OXYCODONE HCL 5 MG PO TABS: 5.0000 mg | ORAL_TABLET | ORAL | 0 refills | Status: DC | PRN

## 2020-01-23 ENCOUNTER — Ambulatory Visit (INDEPENDENT_AMBULATORY_CARE_PROVIDER_SITE_OTHER): Payer: No Typology Code available for payment source | Admitting: *Deleted

## 2020-01-23 ENCOUNTER — Encounter: Payer: Self-pay | Admitting: *Deleted

## 2020-01-23 ENCOUNTER — Other Ambulatory Visit: Payer: Self-pay

## 2020-01-23 VITALS — BP 123/83 | HR 93 | Ht 66.0 in

## 2020-01-23 DIAGNOSIS — Z0130 Encounter for examination of blood pressure without abnormal findings: Secondary | ICD-10-CM

## 2020-01-23 LAB — SURGICAL PATHOLOGY

## 2020-01-23 NOTE — Progress Notes (Addendum)
   NURSE VISIT- BLOOD PRESSURE CHECK  SUBJECTIVE:  Charlotte Smith is a 25 y.o. [phone] female here for BP check. She is postpartum, delivery date 01/19/20    HYPERTENSION ROS:  Pregnant/postpartum:  . Severe headaches that don't go away with tylenol/other medicines: Yes  . Visual changes (seeing spots/double/blurred vision) No  . Severe pain under right breast breast or in center of upper chest No  . Severe nausea/vomiting No  . Taking medicines as instructed not applicable  .   OBJECTIVE:  BP 123/83 (BP Location: Left Arm, Patient Position: Sitting, Cuff Size: Normal)   Pulse 93   Ht 5\' 6"  (1.676 m)   LMP 05/03/2019 (Approximate)   Breastfeeding Yes   BMI 30.52 kg/m   Appearance alert, well appearing, and in no distress.  ASSESSMENT: Postpartum  blood pressure check  PLAN: Discussed with 05/05/2019, CNM, [facility]   Recommendations: no changes needed   Follow-up: as scheduled   [facility]  01/23/2020 10:42 AM   Chart reviewed for nurse visit. Agree with plan of care. Currently w/o headache. BP good. 03/22/2020, Cheral Marker 01/23/2020 12:16 PM

## 2020-01-25 ENCOUNTER — Other Ambulatory Visit: Payer: Self-pay | Admitting: Advanced Practice Midwife

## 2020-01-25 MED ORDER — GABAPENTIN 100 MG PO CAPS: 100.0000 mg | ORAL_CAPSULE | Freq: Three times a day (TID) | ORAL | 0 refills | Status: DC | PRN

## 2020-01-26 ENCOUNTER — Ambulatory Visit (INDEPENDENT_AMBULATORY_CARE_PROVIDER_SITE_OTHER): Payer: No Typology Code available for payment source | Admitting: Advanced Practice Midwife

## 2020-01-26 ENCOUNTER — Encounter: Payer: Self-pay | Admitting: Advanced Practice Midwife

## 2020-01-26 ENCOUNTER — Other Ambulatory Visit: Payer: Self-pay

## 2020-01-26 NOTE — Progress Notes (Signed)
[facility] Visit  Patient name: Charlotte Smith MRN [id number]  Date of birth: [birth year]  CC & HPI:  Charlotte Smith is a 25 y.o.  female presenting today for incision check. RCS 12/30.  Still a little constipated, small BM yesterday.  Taking senna and gas pills  Pertinent History Reviewed:  Medical & Surgical Hx:   Past Medical History:  Diagnosis Date  . Renal disorder    non-functioning left kidney  . Tachycardia 01/06/2020   Past Surgical History:  Procedure Laterality Date  . BLADDER SURGERY    . CESAREAN SECTION N/A 10/30/2014   Procedure: CESAREAN SECTION;  Surgeon: Lazaro Arms, MD;  Location: [facility];  Service: Obstetrics;  Laterality: N/A;  . CESAREAN SECTION WITH BILATERAL TUBAL LIGATION N/A 01/19/2020   Procedure: CESAREAN SECTION WITH BILATERAL TUBAL LIGATION;  Surgeon: Lazaro Arms, MD;  Location: [facility];  Service: Obstetrics;  Laterality: N/A;   Family History  Problem Relation Age of Onset  . Depression Mother   . Hypertension Father   . Cancer Maternal Grandmother        lung  . Cancer Paternal Grandfather        testicular, lung    Current Outpatient Medications:  .  Acetaminophen (TYLENOL PO), Take by mouth., Disp: , Rfl:  .  gabapentin (NEURONTIN) 100 MG capsule, Take 1 capsule (100 mg total) by mouth 3 (three) times daily as needed., Disp: 15 capsule, Rfl: 0 .  Pediatric Multivitamins-Iron (FLINTSTONES COMPLETE PO), Take 2 tablets by mouth., Disp: , Rfl:  .  omeprazole (PRILOSEC) 10 MG capsule, Take 1 bid (Patient not taking: Reported on 01/26/2020), Disp: 60 capsule, Rfl: 3 .  oxyCODONE (OXY IR/ROXICODONE) 5 MG immediate release tablet, Take 1-2 tablets (5-10 mg total) by mouth every 4 (four) hours as needed for moderate pain. (Patient not taking: Reported on 01/26/2020), Disp: 30 tablet, Rfl: 0 Social History: Reviewed -  reports that she has never smoked. She has never used smokeless tobacco.  Review of Systems:   Constitutional:  Negative for fever and chills Eyes: Negative for visual disturbances Respiratory: Negative for shortness of breath, dyspnea Cardiovascular: Negative for chest pain or palpitations  Gastrointestinal: Negative for vomiting, diarrhea and constipation; no abdominal pain Genitourinary: Negative for dysuria and urgency, vaginal irritation or itching Musculoskeletal: Negative for back pain, joint pain, myalgias  Neurological: Negative for dizziness and headaches    Objective Findings:    Physical Examination: Vitals:   01/26/20 1121  BP: 136/86  Pulse: 94   General appearance - well appearing, and in no distress Mental status - alert, oriented to person, place, and time Chest:  Normal respiratory effort Heart - normal rate and regular rhythm Abdomen:  Soft, nontender;  Dry dsg removed. Incision looks great  Pelvic: deferred Musculoskeletal:  Normal range of motion without pain Extremities:  No edema    No results found for this or any previous visit (from the past 24 hour(s)).    Assessment & Plan:  A:   1 week Postop, RCS, doing well P:     Return for as scheduled (can be a virtual visit).  Jacklyn Shell CNM 01/26/2020 11:54 AM

## 2020-01-31 ENCOUNTER — Encounter: Payer: No Typology Code available for payment source | Admitting: Obstetrics & Gynecology

## 2020-02-18 NOTE — Progress Notes (Deleted)
Cardiology Office Note:    Date:  02/18/2020   ID:  Charlotte Smith, DOB [birth year], MRN [id number]  PCP:  Benita Stabile, MD  Cardiologist:  Chilton Si, MD  Electrophysiologist:  None   Referring MD: Benita Stabile, MD   Chief Complaint: follow-up of tachycardia  History of Present Illness:    Charlotte Smith is a 25 y.o. female with a history of tachycardia/palpitations with episodes of Wenckebach noted on monitor in 12/2019 who is followed by Dr. Duke Salvia and presents today for follow-up of tachycardia.   Patient was referred to Dr. Duke Salvia on 01/06/2020 for further evaluation of tachycardia and dizziness. At that time, she was [redacted] weeks pregnant with her 2nd child. She reported episodes of tachycardia with rates in the 150's which would last for 30-60 minutes at a time. During these episodes, she would feel lightheaded and presyncopal as well as some shortness of breath but no chest pain or syncope. She went to the ED after one of these episodes but by the time she got there her symptoms had resolved. She was drinking a lot of [facility] at the time. Once she stopped this, symptoms resolved. Zio Monitor was ordered for further evaluation and showed sinus rhythm with average heart rate of 104 bpm (min heart rate 57 bpm, max heart rate 169 bpm) with a few episodes of Mobitz 1 second degree heart block (Wenckebach) but no high grade AV block, pauses, or significant arrhythmias. Patient was advised to limit caffeine.   Patient presents today for follow-up. ***  Tachycardia Mobitz 1 Second Degree Heart Block (Wenckebach) Episodes of tachycardia resolved after patient cut back on [facility]. Zio Monitor last month showed underlying sinus rhythm with average heart rate of 104 bpm (min heart rate 57 bpm, max heart rate 169 bpm) and episodes of Mobitz 1 second degree heart block (Wenckebach) but no high grade AV block, pauses, or significant arrhythmias. Continue to limit caffeine intake.  ***  Past Medical History:  Diagnosis Date  . Renal disorder    non-functioning left kidney  . Tachycardia 01/06/2020    Past Surgical History:  Procedure Laterality Date  . BLADDER SURGERY    . CESAREAN SECTION N/A 10/30/2014   Procedure: CESAREAN SECTION;  Surgeon: Lazaro Arms, MD;  Location: [facility];  Service: Obstetrics;  Laterality: N/A;  . CESAREAN SECTION WITH BILATERAL TUBAL LIGATION N/A 01/19/2020   Procedure: CESAREAN SECTION WITH BILATERAL TUBAL LIGATION;  Surgeon: Lazaro Arms, MD;  Location: [facility];  Service: Obstetrics;  Laterality: N/A;    Current Medications: No outpatient medications have been marked as taking for the 02/20/20 encounter (Appointment) with [name], PA-C.     Allergies:   Ibuprofen   Social History   Socioeconomic History  . Marital status: Married    Spouse name: Not on file  . Number of children: 1  . Years of education: Not on file  . Highest education level: Not on file  Occupational History  . Not on file  Tobacco Use  . Smoking status: Never Smoker  . Smokeless tobacco: Never Used  Vaping Use  . Vaping Use: Never used  Substance and Sexual Activity  . Alcohol use: No  . Drug use: No  . Sexual activity: Not Currently    Birth control/protection: Surgical    Comment: tubal  Other Topics Concern  . Not on file  Social History Narrative  . Not on file   Social Determinants of Health  Financial Resource Strain: Low Risk   . Difficulty of Paying Living Expenses: Not hard at all  Food Insecurity: No Food Insecurity  . Worried About Programme researcher, broadcasting/film/video in the Last Year: Never true  . Ran Out of Food in the Last Year: Never true  Transportation Needs: No Transportation Needs  . Lack of Transportation (Medical): No  . Lack of Transportation (Non-Medical): No  Physical Activity: Insufficiently Active  . Days of Exercise per Week: 3 days  . Minutes of Exercise per Session: 40 min  Stress: No Stress Concern  Present  . Feeling of Stress : Not at all  Social Connections: Moderately Integrated  . Frequency of Communication with Friends and Family: Three times a week  . Frequency of Social Gatherings with Friends and Family: Once a week  . Attends Religious Services: 1 to 4 times per year  . Active Member of Clubs or Organizations: No  . Attends Banker Meetings: Never  . Marital Status: Married     Family History: The patient's ***family history includes Cancer in her maternal grandmother and paternal grandfather; Depression in her mother; Hypertension in her father.  ROS:   Please see the history of present illness.    *** All other systems reviewed and are negative.  EKGs/Labs/Other Studies Reviewed:    The following studies were reviewed today:  Zio Monitor 12/2019: Quality: Fair.  Baseline artifact. Predominant rhythm: sinus rhythm Average heart rate: 104 bpm Max heart rate: 169 bpm Min heart rate: 57 bpm  Episodes of second degree heart block (Mobitz I)   EKG:  EKG *** ordered today. EKG personally reviewed and demonstrates ***.  Recent Labs: 12/27/2019: B Natriuretic Peptide 63.8; TSH 0.501 01/17/2020: ALT 18; BUN 8; Potassium 3.9; Sodium 133 01/19/2020: Creatinine, Ser 1.09 01/20/2020: Hemoglobin 9.5; Platelets 203  Recent Lipid Panel No results found for: CHOL, TRIG, HDL, CHOLHDL, VLDL, LDLCALC, LDLDIRECT  Physical Exam:    Vital Signs: There were no vitals taken for this visit.    Wt Readings from Last 3 Encounters:  01/26/20 177 lb (80.3 kg)  01/19/20 189 lb 1.6 oz (85.8 kg)  01/11/20 187 lb (84.8 kg)     General: 25 y.o. female in no acute distress. HEENT: Normocephalic and atraumatic. Sclera clear. EOMs intact. Neck: Supple. No carotid bruits. No JVD. Heart: *** RRR. Distinct S1 and S2. No murmurs, gallops, or rubs. Radial and distal pedal pulses 2+ and equal bilaterally. Lungs: No increased work of breathing. Clear to ausculation  bilaterally. No wheezes, rhonchi, or rales.  Abdomen: Soft, non-distended, and non-tender to palpation. Bowel sounds present in all 4 quadrants.  MSK: Normal strength and tone for age. *** Extremities: No lower extremity edema.    Skin: Warm and dry. Neuro: Alert and oriented x3. No focal deficits. Psych: Normal affect. Responds appropriately.   Assessment:    No diagnosis found.  Plan:     Disposition: Follow up in ***   Medication Adjustments/Labs and Tests Ordered: Current medicines are reviewed at length with the patient today.  Concerns regarding medicines are outlined above.  No orders of the defined types were placed in this encounter.  No orders of the defined types were placed in this encounter.   There are no Patient Instructions on file for this visit.   Signed, [name], PA-C  02/18/2020 11:56 AM    [facility]

## 2020-02-20 ENCOUNTER — Ambulatory Visit: Payer: No Typology Code available for payment source | Admitting: Student

## 2020-03-01 ENCOUNTER — Telehealth: Payer: No Typology Code available for payment source | Admitting: Advanced Practice Midwife

## 2020-03-07 ENCOUNTER — Encounter: Payer: Self-pay | Admitting: Advanced Practice Midwife

## 2020-03-07 ENCOUNTER — Telehealth (INDEPENDENT_AMBULATORY_CARE_PROVIDER_SITE_OTHER): Payer: No Typology Code available for payment source | Admitting: Advanced Practice Midwife

## 2020-03-07 ENCOUNTER — Other Ambulatory Visit: Payer: Self-pay

## 2020-03-07 ENCOUNTER — Ambulatory Visit (INDEPENDENT_AMBULATORY_CARE_PROVIDER_SITE_OTHER): Payer: No Typology Code available for payment source

## 2020-03-07 VITALS — BP 117/80

## 2020-03-07 DIAGNOSIS — Z98891 History of uterine scar from previous surgery: Secondary | ICD-10-CM

## 2020-03-07 DIAGNOSIS — Z9851 Tubal ligation status: Secondary | ICD-10-CM

## 2020-03-07 DIAGNOSIS — Z0130 Encounter for examination of blood pressure without abnormal findings: Secondary | ICD-10-CM

## 2020-03-07 NOTE — Progress Notes (Addendum)
   NURSE VISIT- BLOOD PRESSURE CHECK  SUBJECTIVE:  Charlotte Smith is a 25 y.o. G55P2002 female here for BP check. She is postpartum, delivery date 01/19/20    HYPERTENSION ROS:  Pregnant/postpartum:  . Severe headaches that don't go away with tylenol/other medicines: No  . Visual changes (seeing spots/double/blurred vision) No  . Severe pain under right breast breast or in center of upper chest No  . Severe nausea/vomiting No  . Taking medicines as instructed not applicable  OBJECTIVE:  BP 117/80 (BP Location: Right Arm, Patient Position: Sitting, Cuff Size: Normal)   Appearance alert, well appearing, and in no distress and oriented to person, place, and time.  ASSESSMENT: Postpartum  blood pressure check  PLAN: Discussed with Philipp Deputy, CNM   Recommendations: no changes needed   Follow-up: as scheduled   Keefe Zawistowski A Khadar Monger  03/07/2020 4:16 PM   Chart reviewed for nurse visit. Agree with plan of care.  Arabella Merles, CNM 03/07/2020 4:47 PM

## 2020-03-07 NOTE — Progress Notes (Addendum)
TELEHEALTH VIRTUAL POSTPARTUM VISIT ENCOUNTER NOTE Patient name: Charlotte Smith MRN [id number]  Date of birth: 1995/05/12  I connected with patient on 03/07/20 at 10:10 AM EST by MyChart initially, then changed to telephone due to connection concerns and verified that I am speaking with the correct person using two identifiers. Pt is not currently in our office, she is in her vehicle.  The provider is in the office.    I discussed the limitations, risks, security and privacy concerns of performing an evaluation and management service by telephone and the availability of in person appointments. I also discussed with the patient that there may be a patient responsible charge related to this service. The patient expressed understanding and agreed to proceed.  Chief Complaint:   Postpartum Care  History of Present Illness:   Charlotte Smith is a 25 y.o. G72P2002 Caucasian female being seen today for a postpartum visit. She is 6.5 weeks postpartum following a repeat cesarean section, low transverse incision at 37.2 gestational weeks. IOL: NoAnesthesia: spinal.  Laceration: n/a.  Complications: none. Inpatient contraception: yes , BTL.   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 Tobacco use: no. Substance use disorder: no. Last pap smear: July 2021 and results were NILM w/ HRHPV negative. Next pap smear due: July 2024 No LMP recorded.  Postpartum course has been uncomplicated. Bleeding no bleeding. Bowel function is normal. Bladder function is normal. Urinary incontinence? No, fecal incontinence? No Did not ask about patient being sexually active. Desired contraception: BTL done PP. Patient does not want a pregnancy in the future.  Desired family size is 2 children.   Upstream - 03/07/20 1027      Pregnancy Intention Screening   Does the patient want to become pregnant in the next year? No    Does the patient's partner want to become pregnant in the next year? No    Would the  patient like to discuss contraceptive options today? No      Contraception Wrap Up   Current Method Female Sterilization    End Method Female Sterilization    Contraception Counseling Provided No          The pregnancy intention screening data noted above was reviewed. Potential methods of contraception were discussed. The patient elected to proceed with Female Sterilization.   Edinburgh Postpartum Depression Screening: Negative  Edinburgh Postnatal Depression Scale - 03/07/20 1027      Edinburgh Postnatal Depression Scale:  In the Past 7 Days   I have been able to laugh and see the funny side of things. 0    I have looked forward with enjoyment to things. 0    I have blamed myself unnecessarily when things went wrong. 0    I have been anxious or worried for no good reason. 3    I have felt scared or panicky for no good reason. 2    Things have been getting on top of me. 2    I have been so unhappy that I have had difficulty sleeping. 0    I have felt sad or miserable. 0    I have been so unhappy that I have been crying. 0    The thought of harming myself has occurred to me. 0    Edinburgh Postnatal Depression Scale Total 7          Baby's course has been uncomplicated. Baby is feeding by bottle. Infant has a pediatrician/family doctor? Yes.  Childcare strategy if returning to work/school: her mother  watches the kids.  Pt has material needs met for her and baby: Yes.   Review of Systems:   Pertinent items are noted in HPI Denies Abnormal vaginal discharge w/ itching/odor/irritation, headaches, visual changes, shortness of breath, chest pain, abdominal pain, severe nausea/vomiting, or problems with urination or bowel movements. Pertinent History Reviewed:  Reviewed past medical,surgical, obstetrical and family history.  Reviewed problem list, medications and allergies. OB History  Gravida Para Term Preterm AB Living  _0 0 2  SAB IAB Ectopic Multiple Live Births  0     0  2    # Outcome Date GA Lbr Len/2nd Weight Sex Delivery Anes PTL Lv  2 Term 01/19/20 [redacted]w[redacted]d 7 lb 7.6 oz (3.39 kg) M CS-LTranv Spinal  LIV  1 Term 10/30/14 [id number] lb 8.6 oz (2.965 kg) F CS-LTranv Spinal  LIV   Physical Assessment:   Vitals:   03/07/20 1024  Weight: 165 lb (74.8 kg)  Height: _1  (1.676 m)  Body mass index is 26.63 kg/m.         Physical Examination:   General:  Alert, oriented and cooperative.   Mental Status: Normal mood and affect perceived.   Rest of physical exam deferred due to type of encounter       No results found for this or any previous visit (from the past 24 hour(s)).  Assessment & Plan:  1) Postpartum exam 2) 6.5 wks s/p repeat cesarean section, low transverse incision 3) bottle feeding 4) Depression screening, neg, but feels like she has less patience with things in general; reviewed free access to [facility] 5) Contraception management- already had BTL  **she will come in this afternoon for an RN BP check when she drops off her FMLA paperwork **  Essential components of care per ACOG recommendations:  1.  Mood and well being:  . If positive depression screen, discussed and plan developed.  . If using tobacco we discussed reduction/cessation and risk of relapse . If current substance abuse, we discussed and referral to local resources was offered.   2. Infant care and feeding:  . If breastfeeding, discussed returning to work, pumping, breastfeeding-associated pain, guidance regarding return to fertility while lactating if not using another method. If needed, patient was provided with a letter to be allowed to pump q 2-3hrs to support lactation in a private location with access to a refrigerator to store breastmilk.   . Recommended that all caregivers be immunized for flu, pertussis and other preventable communicable diseases . If pt does not have material needs met for her/baby, referred to local resources for help obtaining  these.  3. Sexuality, contraception and birth spacing . Provided guidance regarding sexuality, management of dyspareunia, and resumption of intercourse . Discussed avoiding interpregnancy interval <52m252m and recommended birth spacing of 18 months  4. Sleep and fatigue . Discussed coping options for fatigue and sleep disruption . Encouraged family/partner/community support of 4 hrs of uninterrupted sleep to help with mood and fatigue  5. Physical recovery  . If pt had a C/S, assessed incisional pain and providing guidance on normal vs prolonged recovery . If pt had a laceration, perineal healing and pain reviewed.  . If urinary or fecal incontinence, discussed management and referred to PT or uro/gyn if indicated  . Patient is safe to resume physical activity. Discussed attainment of healthy weight.  6.  Chronic disease management . Discussed pregnancy complications if any, and their implications for  future childbearing and long-term maternal health. . Review recommendations for prevention of recurrent pregnancy complications, such as 17 hydroxyprogesterone caproate to reduce risk for recurrent PTB not applicable, or aspirin to reduce risk of preeclampsia not applicable. . Pt had GDM: No. If yes, 2hr GTT scheduled: not applicable. . Reviewed medications and non-pregnant dosing including consideration of whether pt is breastfeeding using a reliable resource such as LactMed: not applicable . Referred for f/u w/ PCP or subspecialist providers as indicated: not applicable  7. Health maintenance . Mammogram at 27yo or earlier if indicated . Pap smears as indicated  Meds: No orders of the defined types were placed in this encounter.   Follow-up: Return in about 1 year (around 03/07/2021) for Physical.    No orders of the defined types were placed in this encounter.    I provided 10 minutes of non-face-to-face time during this encounter.  Myrtis Ser CNM 03/07/2020 1:00 PM

## 2021-05-06 ENCOUNTER — Other Ambulatory Visit (HOSPITAL_BASED_OUTPATIENT_CLINIC_OR_DEPARTMENT_OTHER): Payer: Self-pay

## 2021-05-06 MED ORDER — COVID-19 AT HOME ANTIGEN TEST VI KIT
PACK | 0 refills | Status: DC
  Filled 2021-05-06: qty 4, 8d supply, fill #0

## 2021-05-08 ENCOUNTER — Ambulatory Visit
Admission: EM | Admit: 2021-05-08 | Discharge: 2021-05-08 | Disposition: A | Discharge status: Home / Self Care | Payer: No Typology Code available for payment source | Attending: Family Medicine | Admitting: Family Medicine

## 2021-05-08 DIAGNOSIS — J029 Acute pharyngitis, unspecified: Secondary | ICD-10-CM

## 2021-05-08 DIAGNOSIS — J020 Streptococcal pharyngitis: Secondary | ICD-10-CM

## 2021-05-08 LAB — POCT RAPID STREP A (OFFICE): Rapid Strep A Screen: POSITIVE — AB

## 2021-05-08 MED ORDER — AMOXICILLIN 875 MG PO TABS: 875.0000 mg | ORAL_TABLET | Freq: Two times a day (BID) | ORAL | 0 refills | Status: AC

## 2021-05-08 NOTE — ED Provider Notes (Signed)
?[facility] ? ? ?[id number] ?05/08/21 Arrival Time: 1423 ? ?ASSESSMENT & PLAN: ? ?1. Streptococcal sore throat   ? ?No signs of peritonsillar abscess. ?Begin: ?Meds ordered this encounter  ?Medications  ? amoxicillin (AMOXIL) 875 MG tablet  ?  Sig: Take 1 tablet (875 mg total) by mouth 2 (two) times daily for 10 days.  ?  Dispense:  20 tablet  ?  Refill:  0  ? ?Labs Reviewed  ?POCT RAPID STREP A (OFFICE) - Abnormal; Notable for the following components:  ?    Result Value  ? Rapid Strep A Screen Positive (*)   ? All other components within normal limits  ? ?OTC analgesics and throat care as needed ? ?Instructed to finish full 10 day course of antibiotics. Will follow up if not showing significant improvement over the next 24-48 hours. ? ?Reviewed expectations re: course of current medical issues. Questions answered. ?Outlined signs and symptoms indicating need for more acute intervention. ?Patient verbalized understanding. ?After Visit Summary given. ? ? ?SUBJECTIVE: ? ?Charlotte Smith is a 26 y.o. female who reports a sore throat. Onset gradual beginning  2-3 days ago . Symptoms have gradually worsened since beginning; without voice changes. No respiratory symptoms. Normal PO intake but reports discomfort with swallowing. No specific alleviating factors. Fever: present, moderate, 101-102+. No neck pain or swelling. No associated nausea, vomiting, or abdominal pain. Known sick contacts: none. Recent travel: none. ?Ibuprofen with mild help. ? ? ?OBJECTIVE: ? ?Vitals:  ? 05/08/21 1434  ?BP: 121/80  ?Pulse: 95  ?Resp: 18  ?Temp: 99.8 ?F (37.7 ?C)  ?TempSrc: Oral  ?SpO2: 97%  ?  ? ?General appearance: alert; no distress ?HEENT: throat with moderate erythema/irritation; no exudates; uvula is midline ?Neck: supple with FROM; small cerv LAD ?Lungs: speaks full sentences without difficulty; unlabored ?Abd: soft; non-tender ?Skin: reveals no rash; warm and dry ?Psychological: alert and cooperative; normal mood and  affect ? ?Allergies  ?Allergen Reactions  ? Ibuprofen Other (See Comments)  ?  Reported hematemesis with schedule ibuprofen s/p first Cesarean.   ? ? ?Past Medical History:  ?Diagnosis Date  ? Renal disorder   ? non-functioning left kidney  ? Tachycardia 01/06/2020  ? ?Social History  ? ?Socioeconomic History  ? Marital status: Married  ?  Spouse name: Not on file  ? Number of children: 1  ? Years of education: Not on file  ? Highest education level: Not on file  ?Occupational History  ? Not on file  ?Tobacco Use  ? Smoking status: Never  ? Smokeless tobacco: Never  ?Vaping Use  ? Vaping Use: Never used  ?Substance and Sexual Activity  ? Alcohol use: No  ? Drug use: No  ? Sexual activity: Not Currently  ?  Birth control/protection: Surgical  ?  Comment: tubal  ?Other Topics Concern  ? Not on file  ?Social History Narrative  ? Not on file  ? ?Social Determinants of Health  ? ?Financial Resource Strain: Not on file  ?Food Insecurity: Not on file  ?Transportation Needs: Not on file  ?Physical Activity: Not on file  ?Stress: Not on file  ?Social Connections: Not on file  ?Intimate Partner Violence: Not on file  ? ?Family History  ?Problem Relation Age of Onset  ? Depression Mother   ? Hypertension Father   ? Cancer Maternal Grandmother   ?     lung  ? Cancer Paternal Grandfather   ?     testicular, lung  ? ? ? ? ? ? ? ?  ?  Mardella Layman, MD ?05/08/21 1617 ? ?

## 2021-05-08 NOTE — ED Triage Notes (Signed)
Pt reports fever 101.7 F, sore throat, drainage and bilateral ear pain 3 days. Tylenol gives no relief.   ?

## 2021-11-05 ENCOUNTER — Other Ambulatory Visit (HOSPITAL_BASED_OUTPATIENT_CLINIC_OR_DEPARTMENT_OTHER): Payer: Self-pay

## 2021-11-05 MED ORDER — INFLUENZA VAC SPLIT QUAD 0.5 ML IM SUSY
PREFILLED_SYRINGE | INTRAMUSCULAR | 0 refills | Status: DC
  Filled 2021-11-05: qty 0.5, 1d supply, fill #0

## 2022-03-30 ENCOUNTER — Ambulatory Visit
Admission: EM | Admit: 2022-03-30 | Discharge: 2022-03-30 | Disposition: A | Discharge status: Home / Self Care | Payer: 59 | Attending: Family Medicine | Admitting: Family Medicine

## 2022-03-30 DIAGNOSIS — J069 Acute upper respiratory infection, unspecified: Secondary | ICD-10-CM

## 2022-03-30 LAB — POCT RAPID STREP A (OFFICE): Rapid Strep A Screen: NEGATIVE

## 2022-03-30 LAB — POCT INFLUENZA A/B
Influenza A, POC: NEGATIVE
Influenza B, POC: NEGATIVE

## 2022-03-30 MED ORDER — PROMETHAZINE-DM 6.25-15 MG/5ML PO SYRP: 5.0000 mL | ORAL_SOLUTION | Freq: Four times a day (QID) | ORAL | 0 refills | Status: DC | PRN

## 2022-03-30 NOTE — ED Provider Notes (Signed)
[facility]    CSN: [id number] Arrival date & time: 03/30/22  0955      History   Chief Complaint Chief Complaint  Patient presents with   Sore Throat    HPI Charlotte Smith is a 27 y.o. female.   Presenting today with 1 day history of sore throat, cough, body aches, fatigue.  Denies chest pain, shortness of breath, abdominal pain, nausea vomiting or diarrhea.  Trying Tylenol with minimal relief.  Daughter sick with similar symptoms.    Past Medical History:  Diagnosis Date   Renal disorder    non-functioning left kidney   Tachycardia 01/06/2020    Patient Active Problem List   Diagnosis Date Noted   Tachycardia 01/06/2020   Chronic renal disease 03/13/2014    Past Surgical History:  Procedure Laterality Date   BLADDER SURGERY     CESAREAN SECTION N/A 10/30/2014   Procedure: CESAREAN SECTION;  Surgeon: Florian Buff, MD;  Location: [facility];  Service: Obstetrics;  Laterality: N/A;   CESAREAN SECTION WITH BILATERAL TUBAL LIGATION N/A 01/19/2020   Procedure: CESAREAN SECTION WITH BILATERAL TUBAL LIGATION;  Surgeon: Florian Buff, MD;  Location: [facility];  Service: Obstetrics;  Laterality: N/A;    OB History     Gravida  2   Para  2   Term  2   Preterm      AB  0   Living  2      SAB  0   IAB      Ectopic      Multiple  0   Live Births  2            Home Medications    Prior to Admission medications   Medication Sig Start Date End Date Taking? Authorizing Provider  promethazine-dextromethorphan (PROMETHAZINE-DM) 6.25-15 MG/5ML syrup Take 5 mLs by mouth 4 (four) times daily as needed. 03/30/22  Yes Volney American, PA-C  Acetaminophen (TYLENOL PO) Take by mouth.    [provider]  COVID-19 At Home Antigen Test KIT Use as directed 05/06/21   Margie Ege, [facility]  gabapentin (NEURONTIN) 100 MG capsule Take 1 capsule (100 mg total) by mouth 3 (three) times daily as needed. 01/25/20   Serita Grammes D, CNM  influenza  vac split quadrivalent PF (FLUARIX) 0.5 ML injection Inject into the muscle. 11/05/21   Carlyle Basques, MD    Family History Family History  Problem Relation Age of Onset   Depression Mother    Hypertension Father    Cancer Maternal Grandmother        lung   Cancer Paternal Grandfather        testicular, lung    Social History Social History   Tobacco Use   Smoking status: Never   Smokeless tobacco: Never  Vaping Use   Vaping Use: Never used  Substance Use Topics   Alcohol use: No   Drug use: No     Allergies   Ibuprofen   Review of Systems Review of Systems PER HPI  Physical Exam Triage Vital Signs ED Triage Vitals  Enc Vitals Group     BP 03/30/22 1001 (!) 123/90     Pulse Rate 03/30/22 1001 87     Resp 03/30/22 1001 20     Temp 03/30/22 1001 97.9 F (36.6 C)     Temp Source 03/30/22 1001 Oral     SpO2 03/30/22 1001 97 %     Weight --  Height --      Head Circumference --      Peak Flow --      Pain Score 03/30/22 1006 6     Pain Loc --      Pain Edu? --      Excl. in [place]? --    No data found.  Updated Vital Signs BP (!) 123/90 (BP Location: Right Arm)   Pulse 87   Temp 97.9 F (36.6 C) (Oral)   Resp 20   LMP 03/18/2022   SpO2 97%   Visual Acuity Right Eye Distance:   Left Eye Distance:   Bilateral Distance:    Right Eye Near:   Left Eye Near:    Bilateral Near:     Physical Exam Vitals and nursing note reviewed.  Constitutional:      Appearance: Normal appearance.  HENT:     Head: Atraumatic.     Right Ear: Tympanic membrane and external ear normal.     Left Ear: Tympanic membrane and external ear normal.     Nose: Rhinorrhea present.     Mouth/Throat:     Mouth: Mucous membranes are moist.     Pharynx: Posterior oropharyngeal erythema present.  Eyes:     Extraocular Movements: Extraocular movements intact.     Conjunctiva/sclera: Conjunctivae normal.  Cardiovascular:     Rate and Rhythm: Normal rate and regular  rhythm.     Heart sounds: Normal heart sounds.  Pulmonary:     Effort: Pulmonary effort is normal.     Breath sounds: Normal breath sounds. No wheezing or rales.  Musculoskeletal:        General: Normal range of motion.     Cervical back: Normal range of motion and neck supple.  Skin:    General: Skin is warm and dry.  Neurological:     Mental Status: She is alert and oriented to person, place, and time.  Psychiatric:        Mood and Affect: Mood normal.        Thought Content: Thought content normal.      UC Treatments / Results  Labs (all labs ordered are listed, but only abnormal results are displayed) Labs Reviewed  POCT RAPID STREP A (OFFICE)  POCT INFLUENZA A/B    EKG   Radiology No results found.  Procedures Procedures (including critical care time)  Medications Ordered in UC Medications - No data to display  Initial Impression / Assessment and Plan / UC Course  I have reviewed the triage vital signs and the nursing notes.  Pertinent labs & imaging results that were available during my care of the patient were reviewed by me and considered in my medical decision making (see chart for details).     Rapid strep and rapid flu negative, suspect viral upper respiratory infection.  Vitals and exam very reassuring today.  Treat with Phenergan DM, supportive over-the-counter medications and home care.  Return for worsening symptoms.  Final Clinical Impressions(s) / UC Diagnoses   Final diagnoses:  Viral URI   Discharge Instructions   None    ED Prescriptions     Medication Sig Dispense Auth. Provider   promethazine-dextromethorphan (PROMETHAZINE-DM) 6.25-15 MG/5ML syrup Take 5 mLs by mouth 4 (four) times daily as needed. 100 mL Volney American, [place]      PDMP not reviewed this encounter.   Volney American, [place] 03/30/22 1145

## 2022-03-30 NOTE — ED Triage Notes (Signed)
Pt reports she has a sore throat, cough, and body aches x 1 day. Took tylenol but no relief.

## 2022-06-06 ENCOUNTER — Other Ambulatory Visit (HOSPITAL_COMMUNITY): Payer: Self-pay | Admitting: Family Medicine

## 2022-06-06 ENCOUNTER — Ambulatory Visit (HOSPITAL_COMMUNITY)
Admission: RE | Admit: 2022-06-06 | Discharge: 2022-06-06 | Disposition: A | Discharge status: Home / Self Care | Payer: 59 | Source: Ambulatory Visit | Attending: Family Medicine | Admitting: Family Medicine

## 2022-06-06 DIAGNOSIS — M25562 Pain in left knee: Secondary | ICD-10-CM

## 2022-07-30 ENCOUNTER — Other Ambulatory Visit: Payer: Self-pay | Admitting: Oncology

## 2022-07-30 DIAGNOSIS — Z006 Encounter for examination for normal comparison and control in clinical research program: Secondary | ICD-10-CM

## 2022-10-08 DIAGNOSIS — M25562 Pain in left knee: Secondary | ICD-10-CM | POA: Diagnosis not present

## 2022-10-22 DIAGNOSIS — M25562 Pain in left knee: Secondary | ICD-10-CM | POA: Diagnosis not present

## 2022-10-28 ENCOUNTER — Other Ambulatory Visit (HOSPITAL_COMMUNITY): Payer: Self-pay | Admitting: Otolaryngology

## 2022-10-28 DIAGNOSIS — M25562 Pain in left knee: Secondary | ICD-10-CM

## 2022-10-30 ENCOUNTER — Ambulatory Visit (HOSPITAL_COMMUNITY): Payer: 59 | Attending: Otolaryngology

## 2022-10-30 ENCOUNTER — Encounter (HOSPITAL_COMMUNITY): Payer: Self-pay

## 2022-12-10 ENCOUNTER — Telehealth: Payer: 59 | Admitting: Family Medicine

## 2022-12-10 DIAGNOSIS — J020 Streptococcal pharyngitis: Secondary | ICD-10-CM

## 2022-12-10 MED ORDER — LIDOCAINE VISCOUS HCL 2 % MT SOLN: 15.0000 mL | OROMUCOSAL | 0 refills | Status: DC | PRN

## 2022-12-10 MED ORDER — AMOXICILLIN 500 MG PO TABS: 500.0000 mg | ORAL_TABLET | Freq: Two times a day (BID) | ORAL | 0 refills | Status: AC

## 2022-12-10 NOTE — Patient Instructions (Signed)
Charlotte Smith, thank you for joining Freddy Finner, NP for today's virtual visit.  While this provider is not your primary care provider (PCP), if your PCP is located in our provider database this encounter information will be shared with them immediately following your visit.   A [facility] MyChart account gives you access to today's visit and all your visits, tests, and labs performed at [facility] " click here if you don't have a [facility] MyChart account or go to [email or web address]  Consent: (Patient) Charlotte Smith provided verbal consent for this virtual visit at the beginning of the encounter.  Current Medications:  Current Outpatient Medications:    amoxicillin (AMOXIL) 500 MG tablet, Take 1 tablet (500 mg total) by mouth 2 (two) times daily for 10 days., Disp: 20 tablet, Rfl: 0   lidocaine (XYLOCAINE) 2 % solution, Use as directed 15 mLs in the mouth or throat as needed for mouth pain., Disp: 100 mL, Rfl: 0   Acetaminophen (TYLENOL PO), Take by mouth., Disp: , Rfl:    COVID-19 At Home Antigen Test KIT, Use as directed, Disp: 4 kit, Rfl: 0   gabapentin (NEURONTIN) 100 MG capsule, Take 1 capsule (100 mg total) by mouth 3 (three) times daily as needed., Disp: 15 capsule, Rfl: 0   influenza vac split quadrivalent PF (FLUARIX) 0.5 ML injection, Inject into the muscle., Disp: 0.5 mL, Rfl: 0   promethazine-dextromethorphan (PROMETHAZINE-DM) 6.25-15 MG/5ML syrup, Take 5 mLs by mouth 4 (four) times daily as needed., Disp: 100 mL, Rfl: 0   Medications ordered in this encounter:  Meds ordered this encounter  Medications   amoxicillin (AMOXIL) 500 MG tablet    Sig: Take 1 tablet (500 mg total) by mouth 2 (two) times daily for 10 days.    Dispense:  20 tablet    Refill:  0    Order Specific Question:   Supervising Provider    Answer:   Merrilee Jansky [[id number]]   lidocaine (XYLOCAINE) 2 % solution    Sig: Use as directed 15 mLs in the mouth or throat as needed  for mouth pain.    Dispense:  100 mL    Refill:  0    Order Specific Question:   Supervising Provider    Answer:   Merrilee Jansky [[id number]]     *If you need refills on other medications prior to your next appointment, please contact your pharmacy*  Follow-Up: Call back or seek an in-person evaluation if the symptoms worsen or if the condition fails to improve as anticipated.  [facility] [phone]  Other Instructions   -Suspect bacterial pharyngitis - Discussed OTC management - Will prescribe Viscous lidocaine for symptomatic relief, along with ABX for bacterial cover - May use tylenol and ibuprofen alternating every 3-4 hours as needed for pain, fevers, body aches - Salt water gargles - Hydration and voice rest - Seek in person evaluation if worsening or fails to improve    If you have been instructed to have an in-person evaluation today at a local Urgent Care facility, please use the link below. It will take you to a list of all of our available [facility], including address, phone number and hours of operation. Please do not delay care.  [facility]  If you or a family member do not have a primary care provider, use the link below to schedule a visit and establish care. When you choose a [facility] primary care  physician or advanced practice provider, you gain a long-term partner in health. Find a Primary Care Provider  Learn more about [facility] in-office and virtual care options: [facility] - Get Care Now

## 2022-12-10 NOTE — Progress Notes (Signed)
Virtual Visit Consent   Charlotte Smith, you are scheduled for a virtual visit with a [facility] provider today. Just as with appointments in the office, your consent must be obtained to participate. Your consent will be active for this visit and any virtual visit you may have with one of our providers in the next 365 days. If you have a MyChart account, a copy of this consent can be sent to you electronically.  As this is a virtual visit, video technology does not allow for your provider to perform a traditional examination. This may limit your provider's ability to fully assess your condition. If your provider identifies any concerns that need to be evaluated in person or the need to arrange testing (such as labs, EKG, etc.), we will make arrangements to do so. Although advances in technology are sophisticated, we cannot ensure that it will always work on either your end or our end. If the connection with a video visit is poor, the visit may have to be switched to a telephone visit. With either a video or telephone visit, we are not always able to ensure that we have a secure connection.  By engaging in this virtual visit, you consent to the provision of healthcare and authorize for your insurance to be billed (if applicable) for the services provided during this visit. Depending on your insurance coverage, you may receive a charge related to this service.  I need to obtain your verbal consent now. Are you willing to proceed with your visit today? [name] has provided verbal consent on 12/10/2022 for a virtual visit (video or telephone). Freddy Finner, NP  Date: 12/10/2022 10:18 AM  Virtual Visit via Video Note   I, Freddy Finner, connected with  Charlotte Smith  ([id number], 03-28-1995) on 12/10/22 at 10:15 AM EST by a video-enabled telemedicine application and verified that I am speaking with the correct person using two identifiers.  Location: Patient: Virtual Visit Location  Patient: Home Provider: Virtual Visit Location Provider: Home Office   I discussed the limitations of evaluation and management by telemedicine and the availability of in person appointments. The patient expressed understanding and agreed to proceed.    History of Present Illness: Charlotte Smith is a 27 y.o. who identifies as a female who was assigned female at birth, and is being seen today for sore throat  Onset was last night- sore throat and body aches Associated symptoms are fever 100.6 Modifying factors are tylenol Denies chest pain, shortness of breath, fevers, chills  Exposure to sick contacts- known- son and husband had strep   Problems:  Patient Active Problem List   Diagnosis Date Noted   Tachycardia 01/06/2020   Chronic renal disease 03/13/2014    Allergies:  Allergies  Allergen Reactions   Ibuprofen Other (See Comments)    Reported hematemesis with schedule ibuprofen s/p first Cesarean.    Medications:  Current Outpatient Medications:    Acetaminophen (TYLENOL PO), Take by mouth., Disp: , Rfl:    COVID-19 At Home Antigen Test KIT, Use as directed, Disp: 4 kit, Rfl: 0   gabapentin (NEURONTIN) 100 MG capsule, Take 1 capsule (100 mg total) by mouth 3 (three) times daily as needed., Disp: 15 capsule, Rfl: 0   influenza vac split quadrivalent PF (FLUARIX) 0.5 ML injection, Inject into the muscle., Disp: 0.5 mL, Rfl: 0   promethazine-dextromethorphan (PROMETHAZINE-DM) 6.25-15 MG/5ML syrup, Take 5 mLs by mouth 4 (four) times daily as needed., Disp: 100 mL, Rfl: 0  Observations/Objective:  Patient is well-developed, well-nourished in no acute distress.  Resting comfortably  at home.  Head is normocephalic, atraumatic.  No labored breathing.  Speech is clear and coherent with logical content.  Patient is alert and oriented at baseline.  Noted voice change  Assessment and Plan:  1. Strep pharyngitis  - amoxicillin (AMOXIL) 500 MG tablet; Take 1 tablet (500 mg  total) by mouth 2 (two) times daily for 10 days.  Dispense: 20 tablet; Refill: 0 - lidocaine (XYLOCAINE) 2 % solution; Use as directed 15 mLs in the mouth or throat as needed for mouth pain.  Dispense: 100 mL; Refill: 0    -Suspect bacterial pharyngitis - Discussed OTC management - Will prescribe Viscous lidocaine for symptomatic relief, along with for bacterial cover - May use tylenol and ibuprofen alternating every 3-4 hours as needed for pain, fevers, body aches - Salt water gargles - Hydration and voice rest - Seek in person evaluation if worsening or fails to improve   Reviewed side effects, risks and benefits of medication.    Patient acknowledged agreement and understanding of the plan.   Past Medical, Surgical, Social History, Allergies, and Medications have been Reviewed.     Follow Up Instructions: I discussed the assessment and treatment plan with the patient. The patient was provided an opportunity to ask questions and all were answered. The patient agreed with the plan and demonstrated an understanding of the instructions.  A copy of instructions were sent to the patient via MyChart unless otherwise noted below.    The patient was advised to call back or seek an in-person evaluation if the symptoms worsen or if the condition fails to improve as anticipated.    Freddy Finner, NP

## 2023-03-04 ENCOUNTER — Other Ambulatory Visit (HOSPITAL_COMMUNITY)
Admission: RE | Admit: 2023-03-04 | Discharge: 2023-03-04 | Disposition: A | Discharge status: Home / Self Care | Payer: 59 | Source: Ambulatory Visit | Attending: Obstetrics & Gynecology | Admitting: Obstetrics & Gynecology

## 2023-03-04 ENCOUNTER — Other Ambulatory Visit: Payer: Self-pay | Admitting: Obstetrics & Gynecology

## 2023-03-04 ENCOUNTER — Ambulatory Visit (INDEPENDENT_AMBULATORY_CARE_PROVIDER_SITE_OTHER): Payer: 59 | Admitting: Obstetrics & Gynecology

## 2023-03-04 ENCOUNTER — Encounter: Payer: Self-pay | Admitting: Obstetrics & Gynecology

## 2023-03-04 ENCOUNTER — Ambulatory Visit
Admission: RE | Admit: 2023-03-04 | Discharge: 2023-03-04 | Disposition: A | Discharge status: Home / Self Care | Payer: 59 | Source: Ambulatory Visit | Attending: Obstetrics & Gynecology

## 2023-03-04 VITALS — BP 129/82 | HR 115 | Temp 98.6°F | Ht 66.0 in | Wt 172.5 lb

## 2023-03-04 DIAGNOSIS — Z01411 Encounter for gynecological examination (general) (routine) with abnormal findings: Secondary | ICD-10-CM | POA: Diagnosis not present

## 2023-03-04 DIAGNOSIS — Z124 Encounter for screening for malignant neoplasm of cervix: Secondary | ICD-10-CM | POA: Insufficient documentation

## 2023-03-04 DIAGNOSIS — N61 Mastitis without abscess: Secondary | ICD-10-CM | POA: Diagnosis not present

## 2023-03-04 DIAGNOSIS — N6312 Unspecified lump in the right breast, upper inner quadrant: Secondary | ICD-10-CM

## 2023-03-04 DIAGNOSIS — N6310 Unspecified lump in the right breast, unspecified quadrant: Secondary | ICD-10-CM | POA: Diagnosis not present

## 2023-03-04 DIAGNOSIS — N644 Mastodynia: Secondary | ICD-10-CM | POA: Diagnosis not present

## 2023-03-04 NOTE — Progress Notes (Signed)
WELL-WOMAN EXAMINATION Patient name: Charlotte Smith MRN [id number]  Date of birth: 22-Oct-1995 Chief Complaint:   right breast pain (Started Monday afternoon; headache X 2 days; needs pap!!!)  History of Present Illness:   Charlotte Smith is a 28 y.o. [phone] female being seen today for a routine well-woman exam and the following concern:  Right breast pain; started Mon. Afternoon- notes a hard knot that has gotten progressively bigger.  Denies redness or drainage.  Denies fevers or chills.  Of note, she was recently on penicillin this past week from her dentist.  Menses are regular denies heavy menstrual bleeding or dysmenorrhea.  Denies vaginal discharge itching or irritation.  Reports no other acute GYN concern  Patient's last menstrual period was 02/21/2023 (exact date). Denies issues with her menses The current method of family planning is tubal ligation.    Last pap 2021.  Last mammogram: NA. Last colonoscopy: NA     03/04/2023    9:40 AM 11/02/2019   10:29 AM 08/01/2019    2:38 PM 08/01/2019    2:37 PM  Depression screen PHQ 2/9  Decreased Interest 0 0 0 0  Down, Depressed, Hopeless 0 0 0 0  PHQ - 2 Score 0 0 0 0  Altered sleeping 0 0 0 0  Tired, decreased energy 2 0 2 2  Change in appetite 2 0 2 2  Feeling bad or failure about yourself  0 0 0 0  Trouble concentrating 0 0 0 0  Moving slowly or fidgety/restless 0 0 0 0  Suicidal thoughts 0 0 0 0  PHQ-9 Score 4 0 4 4      Review of Systems:   Pertinent items are noted in HPI Denies any headaches, blurred vision, fatigue, shortness of breath, chest pain, abdominal pain, bowel movements, urination, or intercourse unless otherwise stated above.  Pertinent History Reviewed:  Reviewed past medical,surgical, social and family history.  Reviewed problem list, medications and allergies. Physical Assessment:   Vitals:   03/04/23 0932  BP: 129/82  Pulse: (!) 115  Temp: 98.6 F (37 C)  TempSrc: Oral  Weight: 172  lb 8 oz (78.2 kg)  Height: 5\' 6"  (1.676 m)  Body mass index is 27.84 kg/m.        Physical Examination:   General appearance - well appearing, and in no distress  Mental status - alert, oriented to person, place, and time  Psych:  She has a normal mood and affect  Skin - warm and dry, normal color, no suspicious lesions noted  Chest - effort normal, all lung fields clear to auscultation bilaterally  Heart - normal rate and regular rhythm  Neck:  midline trachea, no thyromegaly or nodules  Breasts -normal left breast, right breast with palpable mobile slightly tender at least 5 cm mass.  No overlying erythema. no axillary lymphadenopathy   Abdomen - soft, nontender, nondistended, no masses or organomegaly  Pelvic - VULVA: normal appearing vulva with no masses, tenderness or lesions  VAGINA: normal appearing vagina with normal color and discharge, no lesions  CERVIX: normal appearing cervix without discharge or lesions, no CMT  Thin prep pap is done with HR HPV cotesting  UTERUS: uterus is felt to be normal size, shape, consistency and nontender   ADNEXA: No adnexal masses or tenderness noted.  Extremities:  No swelling or varicosities noted  Chaperone: Faith Rogue     Assessment & Plan:  1) Well-Woman Exam -Pap collected, reviewed ASCCP guidelines  2) right breast mass -  Low suspicion for infection, suspect breast cyst -Imaging ordered, further management pending results  Orders Placed This Encounter  Procedures   [place] LIMITED ULTRASOUND INCLUDING AXILLA LEFT BREAST     Meds: No orders of the defined types were placed in this encounter.   Follow-up: Return in about 1 year (around 03/03/2024) for Annual, scheduled breast US- [place] [facility].   Charlotte Hidalgo, DO Attending Obstetrician & Gynecologist, [facility] for [employer], [facility]

## 2023-03-05 ENCOUNTER — Other Ambulatory Visit: Payer: Self-pay | Admitting: Obstetrics & Gynecology

## 2023-03-05 ENCOUNTER — Encounter: Payer: Self-pay | Admitting: Obstetrics & Gynecology

## 2023-03-05 DIAGNOSIS — N61 Mastitis without abscess: Secondary | ICD-10-CM

## 2023-03-06 ENCOUNTER — Encounter: Payer: Self-pay | Admitting: Obstetrics & Gynecology

## 2023-03-06 LAB — CYTOLOGY - PAP
Comment: NEGATIVE
Diagnosis: NEGATIVE
High risk HPV: NEGATIVE

## 2023-03-11 ENCOUNTER — Telehealth: Payer: 59 | Admitting: Physician Assistant

## 2023-03-11 DIAGNOSIS — J101 Influenza due to other identified influenza virus with other respiratory manifestations: Secondary | ICD-10-CM | POA: Diagnosis not present

## 2023-03-11 MED ORDER — OSELTAMIVIR PHOSPHATE 75 MG PO CAPS: 75.0000 mg | ORAL_CAPSULE | Freq: Two times a day (BID) | ORAL | 0 refills | Status: DC

## 2023-03-11 MED ORDER — PROMETHAZINE-DM 6.25-15 MG/5ML PO SYRP: 5.0000 mL | ORAL_SOLUTION | Freq: Four times a day (QID) | ORAL | 0 refills | Status: DC | PRN

## 2023-03-11 NOTE — Patient Instructions (Signed)
Charlotte Smith, thank you for joining Margaretann Loveless, PA-C for today's virtual visit.  While this provider is not your primary care provider (PCP), if your PCP is located in our provider database this encounter information will be shared with them immediately following your visit.   A [facility] MyChart account gives you access to today's visit and all your visits, tests, and labs performed at [facility] " click here if you don't have a [facility] MyChart account or go to [email or web address]  Consent: (Patient) Charlotte Smith provided verbal consent for this virtual visit at the beginning of the encounter.  Current Medications:  Current Outpatient Medications:    oseltamivir (TAMIFLU) 75 MG capsule, Take 1 capsule (75 mg total) by mouth 2 (two) times daily., Disp: 10 capsule, Rfl: 0   promethazine-dextromethorphan (PROMETHAZINE-DM) 6.25-15 MG/5ML syrup, Take 5 mLs by mouth 4 (four) times daily as needed., Disp: 118 mL, Rfl: 0   Medications ordered in this encounter:  Meds ordered this encounter  Medications   oseltamivir (TAMIFLU) 75 MG capsule    Sig: Take 1 capsule (75 mg total) by mouth 2 (two) times daily.    Dispense:  10 capsule    Refill:  0    Supervising Provider:   Merrilee Jansky [[id number]]   promethazine-dextromethorphan (PROMETHAZINE-DM) 6.25-15 MG/5ML syrup    Sig: Take 5 mLs by mouth 4 (four) times daily as needed.    Dispense:  118 mL    Refill:  0    Supervising Provider:   Merrilee Jansky [[id number]]     *If you need refills on other medications prior to your next appointment, please contact your pharmacy*  Follow-Up: Call back or seek an in-person evaluation if the symptoms worsen or if the condition fails to improve as anticipated.  [facility] [phone]  Other Instructions Influenza, Adult Influenza is also called the flu. It's an infection that affects your respiratory tract. This includes your nose, throat,  windpipe, and lungs. The flu is contagious. This means it spreads easily from person to person. It causes symptoms that are like a cold. It can also cause a high fever and body aches. What are the causes? The flu is caused by the influenza virus. You can get it by: Breathing in droplets that are in the air after an infected person coughs or sneezes. Touching something that has the virus on it and then touching your mouth, nose, or eyes. What increases the risk? You may be more likely to get the flu if: You don't wash your hands often. You're near a lot of people during cold and flu season. You touch your mouth, eyes, or nose without washing your hands first. You don't get a flu shot each year. You may also be more at risk for the flu and serious problems, such as a lung infection called pneumonia, if: You're older than 65. You're pregnant. Your immune system is weak. Your immune system is your body's defense system. You have a long-term, or chronic, condition, such as: Heart, kidney, or lung disease. Diabetes. A liver disorder. Asthma. You're very overweight. You have anemia. This is when you don't have enough red blood cells in your body. What are the signs or symptoms? Flu symptoms often start all of a sudden. They may last 4-14 days and include: Fever and chills. Headaches, body aches, or muscle aches. Sore throat. Cough. Runny or stuffy nose. Discomfort in your chest. Not wanting to eat as much as normal.  Feeling weak or tired. Feeling dizzy. Nausea or vomiting. How is this diagnosed? The flu may be diagnosed based on your symptoms and medical history. You may also have a physical exam. A swab may be taken from your nose or throat and tested for the virus. How is this treated? If the flu is found early, you can be treated with antiviral medicine. This may be given to you by mouth or through an IV. It can help you feel less sick and get better faster. Taking care of yourself  at home can also help your symptoms get better. Your health care provider may tell you to: Take over-the-counter medicines. Drink lots of fluids. The flu often goes away on its own. If you have very bad symptoms or problems caused by the flu, you may need to be treated in a hospital. Follow these instructions at home: Activity Rest as needed. Get lots of sleep. Stay home from work or school as told by your provider. Leave home only to go see your provider. Do not leave home for other reasons until you don't have a fever for 24 hours without taking medicine. Eating and drinking Take an oral rehydration solution (ORS). This is a drink that is sold at pharmacies and stores. Drink enough fluid to keep your pee pale yellow. Try to drink small amounts of clear fluids. These include water, ice chips, fruit juice mixed with water, and low-calorie sports drinks. Try to eat bland foods that are easy to digest. These include bananas, applesauce, rice, lean meats, toast, and crackers. Avoid drinks that have a lot of sugar or caffeine in them. These include energy drinks, regular sports drinks, and soda. Do not drink alcohol. Do not eat spicy or fatty foods. General instructions     Take your medicines only as told by your provider. Use a cool mist humidifier to add moisture to the air in your home. This can make it easier for you to breathe. You should also clean the humidifier every day. To do so: Empty the water. Pour clean water in. Cover your mouth and nose when you cough or sneeze. Wash your hands with soap and water often and for at least 20 seconds. It's extra important to do so after you cough or sneeze. If you can't use soap and water, use hand sanitizer. How is this prevented?  Get a flu shot every year. Ask your provider when you should get your flu shot. Stay away from people who are sick during fall and winter. Fall and winter are cold and flu season. Contact a health care provider  if: You get new symptoms. You have chest pain. You have watery poop, also called diarrhea. You have a fever. Your cough gets worse. You start to have more mucus. You feel like you may vomit, or you vomit. Get help right away if: You become short of breath or have trouble breathing. Your skin or nails turn blue. You have very bad pain or stiffness in your neck. You get a sudden headache or pain in your face or ear. You vomit each time you eat or drink. These symptoms may be an emergency. Call 911 right away. Do not wait to see if the symptoms will go away. Do not drive yourself to the hospital. This information is not intended to replace advice given to you by your health care provider. Make sure you discuss any questions you have with your health care provider. Document Revised: 10/09/2022 Document Reviewed: 02/13/2022 Elsevier Patient  Education  2024 [employer].   If you have been instructed to have an in-person evaluation today at a local Urgent Care facility, please use the link below. It will take you to a list of all of our available [facility], including address, phone number and hours of operation. Please do not delay care.  [facility]  If you or a family member do not have a primary care provider, use the link below to schedule a visit and establish care. When you choose a [facility] primary care physician or advanced practice provider, you gain a long-term partner in health. Find a Primary Care Provider  Learn more about [facility] in-office and virtual care options: [facility] - Get Care Now

## 2023-03-11 NOTE — Progress Notes (Signed)
Virtual Visit Consent   Charlotte Smith, you are scheduled for a virtual visit with a [facility] provider today. Just as with appointments in the office, your consent must be obtained to participate. Your consent will be active for this visit and any virtual visit you may have with one of our providers in the next 365 days. If you have a MyChart account, a copy of this consent can be sent to you electronically.  As this is a virtual visit, video technology does not allow for your provider to perform a traditional examination. This may limit your provider's ability to fully assess your condition. If your provider identifies any concerns that need to be evaluated in person or the need to arrange testing (such as labs, EKG, etc.), we will make arrangements to do so. Although advances in technology are sophisticated, we cannot ensure that it will always work on either your end or our end. If the connection with a video visit is poor, the visit may have to be switched to a telephone visit. With either a video or telephone visit, we are not always able to ensure that we have a secure connection.  By engaging in this virtual visit, you consent to the provision of healthcare and authorize for your insurance to be billed (if applicable) for the services provided during this visit. Depending on your insurance coverage, you may receive a charge related to this service.  I need to obtain your verbal consent now. Are you willing to proceed with your visit today? Charlotte Smith has provided verbal consent on 03/11/2023 for a virtual visit (video or telephone). Margaretann Loveless, PA-C  Date: 03/11/2023 11:32 AM   Virtual Visit via Video Note   I, Margaretann Loveless, connected with  Charlotte Smith  ([id number], May 31, 1995) on 03/11/23 at 11:30 AM EST by a video-enabled telemedicine application and verified that I am speaking with the correct person using two identifiers.  Location: Patient: Virtual Visit  Location Patient: Home Provider: Virtual Visit Location Provider: Home Office   I discussed the limitations of evaluation and management by telemedicine and the availability of in person appointments. The patient expressed understanding and agreed to proceed.    History of Present Illness: Charlotte Smith is a 28 y.o. who identifies as a female who was assigned female at birth, and is being seen today for a flu-like illness.  HPI: URI  This is a new problem. The current episode started yesterday (last night around 9pm). The problem has been gradually worsening. There has been no fever. Associated symptoms include congestion, coughing, headaches, rhinorrhea (and post nasal drainage) and a sore throat. Pertinent negatives include no diarrhea, ear pain, nausea, plugged ear sensation, sinus pain or vomiting. Associated symptoms comments: Body aches, fatigue. She has tried increased fluids and sleep (cough drops) for the symptoms. The treatment provided no relief.   Daughter tested positive for Influenza A last night at ER    Problems:  Patient Active Problem List   Diagnosis Date Noted   Tachycardia 01/06/2020   Chronic renal disease 03/13/2014    Allergies:  Allergies  Allergen Reactions   Ibuprofen Other (See Comments)    Reported hematemesis with schedule ibuprofen s/p first Cesarean.    Medications:  Current Outpatient Medications:    oseltamivir (TAMIFLU) 75 MG capsule, Take 1 capsule (75 mg total) by mouth 2 (two) times daily., Disp: 10 capsule, Rfl: 0   promethazine-dextromethorphan (PROMETHAZINE-DM) 6.25-15 MG/5ML syrup, Take 5 mLs by mouth 4 (four) times daily  as needed., Disp: 118 mL, Rfl: 0  Observations/Objective: Patient is well-developed, well-nourished in no acute distress.  Resting comfortably at home.  Head is normocephalic, atraumatic.  No labored breathing.  Speech is clear and coherent with logical content.  Patient is alert and oriented at baseline.     Assessment and Plan: 1. Influenza A (Primary) - oseltamivir (TAMIFLU) 75 MG capsule; Take 1 capsule (75 mg total) by mouth 2 (two) times daily.  Dispense: 10 capsule; Refill: 0 - promethazine-dextromethorphan (PROMETHAZINE-DM) 6.25-15 MG/5ML syrup; Take 5 mLs by mouth 4 (four) times daily as needed.  Dispense: 118 mL; Refill: 0  - Suspect influenza due to symptoms and positive exposure - Tamiflu prescribed - Promethazine DM for cough - Continue OTC medication of choice for symptomatic management - Push fluids - Rest - Seek in person evaluation if symptoms worsen or fail to improve   Follow Up Instructions: I discussed the assessment and treatment plan with the patient. The patient was provided an opportunity to ask questions and all were answered. The patient agreed with the plan and demonstrated an understanding of the instructions.  A copy of instructions were sent to the patient via MyChart unless otherwise noted below.    The patient was advised to call back or seek an in-person evaluation if the symptoms worsen or if the condition fails to improve as anticipated.    Margaretann Loveless, PA-C

## 2023-05-29 ENCOUNTER — Encounter: Payer: Self-pay | Admitting: Internal Medicine

## 2023-05-29 ENCOUNTER — Ambulatory Visit: Admitting: Internal Medicine

## 2023-05-29 VITALS — BP 120/81 | HR 88 | Resp 16 | Ht 66.0 in | Wt 172.6 lb

## 2023-05-29 DIAGNOSIS — N289 Disorder of kidney and ureter, unspecified: Secondary | ICD-10-CM | POA: Insufficient documentation

## 2023-05-29 DIAGNOSIS — N2881 Hypertrophy of kidney: Secondary | ICD-10-CM | POA: Insufficient documentation

## 2023-05-29 DIAGNOSIS — N390 Urinary tract infection, site not specified: Secondary | ICD-10-CM

## 2023-05-29 DIAGNOSIS — Z862 Personal history of diseases of the blood and blood-forming organs and certain disorders involving the immune mechanism: Secondary | ICD-10-CM

## 2023-05-29 DIAGNOSIS — Z0001 Encounter for general adult medical examination with abnormal findings: Secondary | ICD-10-CM | POA: Insufficient documentation

## 2023-05-29 DIAGNOSIS — E559 Vitamin D deficiency, unspecified: Secondary | ICD-10-CM | POA: Diagnosis not present

## 2023-05-29 DIAGNOSIS — R5382 Chronic fatigue, unspecified: Secondary | ICD-10-CM | POA: Diagnosis not present

## 2023-05-29 DIAGNOSIS — E785 Hyperlipidemia, unspecified: Secondary | ICD-10-CM | POA: Diagnosis not present

## 2023-05-29 DIAGNOSIS — G8929 Other chronic pain: Secondary | ICD-10-CM | POA: Diagnosis not present

## 2023-05-29 DIAGNOSIS — M25562 Pain in left knee: Secondary | ICD-10-CM | POA: Diagnosis not present

## 2023-05-29 NOTE — Assessment & Plan Note (Signed)
 Has history of nonfunctioning right kidney, had urinary reflux and bladder valve procedure S. Cr. Has been around 1.1, GFR > 60 Check CMP Referred to [facility]

## 2023-05-29 NOTE — Progress Notes (Signed)
 New Patient Office Visit  Subjective:  Patient ID: Charlotte Smith, female    DOB: [birth year]  Age: 28 y.o. MRN: [id number]  CC:  Chief Complaint  Patient presents with   Establish Care    Left knee pain for almost a year. Has had xrays but nothing more   Fatigue    Seems more tired than normal and has been going on for about 6 months    Referral    Wants to be referred to a nephrologist in [place]    HPI Charlotte Smith is a 28 y.o. female with past medical history of atrophic right kidney who presents for establishing care.  She has nonfunctioning right kidney and compensatory hypertrophy of the left kidney.  She used to see Dr. Carrolyn Clan for follow-up of it, but requests nephrology referral in [place]. She has remote history of urinary reflux, and has had a bladder wall procedure.  She reports history of recurrent UTI, but she currently denies any dysuria, hematuria, urinary frequency or hesitancy.  She reports chronic fatigue for the last 6 months.  She reports difficulty sleeping, and feels not rested properly in the morning.  Denies any snoring.  She reports feeling low at times due to fatigue.  She admits that she needs to improve her sleep hygiene.  She has a history of anemia, last Hb was 9.5 in 2021.  She denies heavy menstrual cycles, melena or hematochezia currently.  She reports chronic left knee pain, on the lateral side of the joint.  She had x-ray of the knee, which was unremarkable.  She has seen [facility] in the past, was advised to get MRI of the knee, but has not had it done yet.  She prefers to see a Haematologist.   Past Medical History:  Diagnosis Date   Renal disorder    non-functioning left kidney   Tachycardia 01/06/2020    Past Surgical History:  Procedure Laterality Date   BLADDER SURGERY     CESAREAN SECTION N/A 10/30/2014   Procedure: CESAREAN SECTION;  Surgeon: Wendelyn Halter, MD;  Location: [facility];  Service: Obstetrics;   Laterality: N/A;   CESAREAN SECTION WITH BILATERAL TUBAL LIGATION N/A 01/19/2020   Procedure: CESAREAN SECTION WITH BILATERAL TUBAL LIGATION;  Surgeon: Wendelyn Halter, MD;  Location: [facility] ORS;  Service: Obstetrics;  Laterality: N/A;    Family History  Problem Relation Age of Onset   Depression Mother    Anxiety disorder Mother    Arthritis Mother    Drug abuse Mother    Varicose Veins Mother    Hypertension Father    COPD Father    Cancer Maternal Grandmother        lung   Cancer Paternal Grandfather        testicular, lung    Social History   Socioeconomic History   Marital status: Married    Spouse name: Not on file   Number of children: 1   Years of education: Not on file   Highest education level: Some college, no degree  Occupational History   Not on file  Tobacco Use   Smoking status: Never   Smokeless tobacco: Never  Vaping Use   Vaping status: Every Day  Substance and Sexual Activity   Alcohol use: No   Drug use: No   Sexual activity: Yes    Birth control/protection: Surgical    Comment: tubal  Other Topics Concern   Not on file  Social History Narrative  Not on file   Social Drivers of Health   Financial Resource Strain: Low Risk  (05/22/2023)   Overall Financial Resource Strain (CARDIA)    Difficulty of Paying Living Expenses: Not very hard  Food Insecurity: No Food Insecurity (05/22/2023)   Hunger Vital Sign    Worried About Running Out of Food in the Last Year: Never true    Ran Out of Food in the Last Year: Never true  Transportation Needs: No Transportation Needs (05/22/2023)   PRAPARE - Administrator, Civil Service (Medical): No    Lack of Transportation (Non-Medical): No  Physical Activity: Sufficiently Active (05/22/2023)   Exercise Vital Sign    Days of Exercise per Week: 3 days    Minutes of Exercise per Session: 60 min  Recent Concern: Physical Activity - Insufficiently Active (03/04/2023)   Exercise Vital Sign    Days of  Exercise per Week: 3 days    Minutes of Exercise per Session: 30 min  Stress: Stress Concern Present (05/22/2023)   [employer] of Occupational Health - Occupational Stress Questionnaire    Feeling of Stress : Rather much  Social Connections: Socially Integrated (05/22/2023)   Social Connection and Isolation Panel [NHANES]    Frequency of Communication with Friends and Family: Twice a week    Frequency of Social Gatherings with Friends and Family: Once a week    Attends Religious Services: More than 4 times per year    Active Member of [employer] or Organizations: Yes    Attends Engineer, structural: More than 4 times per year    Marital Status: Married  Catering manager Violence: Not At Risk (03/04/2023)   Humiliation, Afraid, Rape, and Kick questionnaire    Fear of Current or Ex-Partner: No    Emotionally Abused: No    Physically Abused: No    Sexually Abused: No    ROS Review of Systems  Constitutional:  Positive for fatigue. Negative for chills and fever.  HENT:  Negative for congestion, sinus pressure, sinus pain and sore throat.   Eyes:  Negative for pain and discharge.  Respiratory:  Negative for cough and shortness of breath.   Cardiovascular:  Negative for chest pain and palpitations.  Gastrointestinal:  Negative for abdominal pain, diarrhea, nausea and vomiting.  Endocrine: Negative for polydipsia and polyuria.  Genitourinary:  Negative for dysuria and hematuria.  Musculoskeletal:  Positive for arthralgias (Left knee). Negative for neck pain and neck stiffness.  Skin:  Negative for rash.  Neurological:  Negative for dizziness and weakness.  Psychiatric/Behavioral:  Positive for sleep disturbance. Negative for agitation and behavioral problems.     Objective:   Today's Vitals: BP 120/81   Pulse 88   Resp 16   Ht 5\' 6"  (1.676 m)   Wt 172 lb 9.6 oz (78.3 kg)   SpO2 98%   BMI 27.86 kg/m   Physical Exam Vitals reviewed.  Constitutional:      General: She  is not in acute distress.    Appearance: She is not diaphoretic.  HENT:     Head: Normocephalic and atraumatic.     Nose: Nose normal.     Mouth/Throat:     Mouth: Mucous membranes are moist.  Eyes:     General: No scleral icterus.    Extraocular Movements: Extraocular movements intact.  Cardiovascular:     Rate and Rhythm: Normal rate and regular rhythm.     Heart sounds: Normal heart sounds. No murmur heard. Pulmonary:  Breath sounds: Normal breath sounds. No wheezing or rales.  Abdominal:     Palpations: Abdomen is soft.     Tenderness: There is no abdominal tenderness.  Musculoskeletal:     Cervical back: Neck supple. No tenderness.     Left knee: No swelling or erythema. Normal range of motion. No tenderness.     Right lower leg: No edema.     Left lower leg: No edema.  Skin:    General: Skin is warm.     Findings: No rash.  Neurological:     General: No focal deficit present.     Mental Status: She is alert and oriented to person, place, and time.  Psychiatric:        Mood and Affect: Mood normal.        Behavior: Behavior normal.     Assessment & Plan:   Problem List Items Addressed This Visit       Genitourinary   Recurrent UTI   She states that she has been treated for UTI without urinary symptoms frequently Likely has asymptomatic bacteriuria If she becomes symptomatic, will refer to urology      Nonfunctioning kidney   Has history of nonfunctioning right kidney, had urinary reflux and bladder valve procedure S. Cr. Has been around 1.1, GFR > 60 Check CMP Referred to [facility]      Relevant Orders   Ambulatory referral to Nephrology   CMP14+EGFR   Compensatory hypertrophy of single kidney   Has history of nonfunctioning/atrophic right kidney Last US  of kidney reviewed from 2020 Denies any urinary symptoms currently      Relevant Orders   Ambulatory referral to Nephrology   CMP14+EGFR     Other   Encounter for general  adult medical examination with abnormal findings - Primary   Physical exam as documented. Fasting blood tests today.      Chronic fatigue   No other B symptoms Check CBC, CMP, TSH and vitamin D Advised to take women's health multivitamin daily Needs to improve sleep quality, sleep hygiene discussed and material provided      Relevant Orders   TSH   CMP14+EGFR   CBC with Differential/Platelet   Chronic pain of left knee   X-ray of knee was unremarkable Referred to orthopedic surgeon to evaluate need for MRI of knee Tylenol  as needed for pain Advised to try knee brace      Relevant Orders   Ambulatory referral to Orthopedic Surgery   Other Visit Diagnoses       Vitamin D deficiency       Relevant Orders   VITAMIN D 25 Hydroxy (Vit-D Deficiency, Fractures)     History of anemia       Relevant Orders   CMP14+EGFR   CBC with Differential/Platelet     Hyperlipidemia, unspecified hyperlipidemia type       Relevant Orders   Lipid panel       Outpatient Encounter Medications as of 05/29/2023  Medication Sig   [DISCONTINUED] oseltamivir  (TAMIFLU ) 75 MG capsule Take 1 capsule (75 mg total) by mouth 2 (two) times daily.   [DISCONTINUED] promethazine -dextromethorphan (PROMETHAZINE -DM) 6.25-15 MG/5ML syrup Take 5 mLs by mouth 4 (four) times daily as needed.   No facility-administered encounter medications on file as of 05/29/2023.    Follow-up: Return in about 6 months (around 11/29/2023) for Fatigue.   Meldon Sport, MD

## 2023-05-29 NOTE — Assessment & Plan Note (Signed)
 Has history of nonfunctioning/atrophic right kidney Last US  of kidney reviewed from 2020 Denies any urinary symptoms currently

## 2023-05-29 NOTE — Assessment & Plan Note (Signed)
 No other B symptoms Check CBC, CMP, TSH and vitamin D Advised to take women's health multivitamin daily Needs to improve sleep quality, sleep hygiene discussed and material provided

## 2023-05-29 NOTE — Assessment & Plan Note (Signed)
 Physical exam as documented. Fasting blood tests today.

## 2023-05-29 NOTE — Assessment & Plan Note (Signed)
 X-ray of knee was unremarkable Referred to orthopedic surgeon to evaluate need for MRI of knee Tylenol  as needed for pain Advised to try knee brace

## 2023-05-29 NOTE — Assessment & Plan Note (Signed)
 She states that she has been treated for UTI without urinary symptoms frequently Likely has asymptomatic bacteriuria If she becomes symptomatic, will refer to urology

## 2023-05-29 NOTE — Patient Instructions (Signed)
 Please take Tylenol  as needed for knee pain.  Please start taking womens' health multivitamins once daily.  Please maintain simple sleep hygiene. - Maintain dark and non-noisy environment in the bedroom. - Please use the bedroom for sleep and sexual activity only. - Do not use electronic devices in the bedroom. - Please take dinner at least 2 hours before bedtime. - Please avoid caffeinated products in the evening, including coffee, soft drinks. - Please try to maintain the regular sleep-wake cycle - Go to bed and wake up at the same time.

## 2023-05-30 LAB — CBC WITH DIFFERENTIAL/PLATELET
Basophils Absolute: 0 10*3/uL (ref 0.0–0.2)
Basos: 1 %
EOS (ABSOLUTE): 0.2 10*3/uL (ref 0.0–0.4)
Eos: 2 %
Hematocrit: 41.6 % (ref 34.0–46.6)
Hemoglobin: 13.7 g/dL (ref 11.1–15.9)
Immature Grans (Abs): 0 10*3/uL (ref 0.0–0.1)
Immature Granulocytes: 0 %
Lymphocytes Absolute: 1.4 10*3/uL (ref 0.7–3.1)
Lymphs: 22 %
MCH: 28.3 pg (ref 26.6–33.0)
MCHC: 32.9 g/dL (ref 31.5–35.7)
MCV: 86 fL (ref 79–97)
Monocytes Absolute: 0.5 10*3/uL (ref 0.1–0.9)
Monocytes: 8 %
Neutrophils Absolute: 4.3 10*3/uL (ref 1.4–7.0)
Neutrophils: 67 %
Platelets: 244 10*3/uL (ref 150–450)
RBC: 4.84 x10E6/uL (ref 3.77–5.28)
RDW: 12.4 % (ref 11.7–15.4)
WBC: 6.4 10*3/uL (ref 3.4–10.8)

## 2023-05-30 LAB — CMP14+EGFR
ALT: 9 IU/L (ref 0–32)
AST: 11 IU/L (ref 0–40)
Albumin: 4.5 g/dL (ref 4.0–5.0)
Alkaline Phosphatase: 63 IU/L (ref 44–121)
BUN/Creatinine Ratio: 14 (ref 9–23)
BUN: 16 mg/dL (ref 6–20)
Bilirubin Total: 0.2 mg/dL (ref 0.0–1.2)
CO2: 23 mmol/L (ref 20–29)
Calcium: 9.8 mg/dL (ref 8.7–10.2)
Chloride: 100 mmol/L (ref 96–106)
Creatinine, Ser: 1.13 mg/dL — ABNORMAL HIGH (ref 0.57–1.00)
Globulin, Total: 2.5 g/dL (ref 1.5–4.5)
Glucose: 93 mg/dL (ref 70–99)
Potassium: 4.4 mmol/L (ref 3.5–5.2)
Sodium: 137 mmol/L (ref 134–144)
Total Protein: 7 g/dL (ref 6.0–8.5)
eGFR: 67 mL/min/{1.73_m2} (ref >59)

## 2023-05-30 LAB — LIPID PANEL
Chol/HDL Ratio: 3.1 ratio (ref 0.0–4.4)
Cholesterol, Total: 197 mg/dL (ref 100–199)
HDL: 63 mg/dL (ref >39)
LDL Chol Calc (NIH): 109 mg/dL — ABNORMAL HIGH (ref 0–99)
Triglycerides: 143 mg/dL (ref 0–149)
VLDL Cholesterol Cal: 25 mg/dL (ref 5–40)

## 2023-05-30 LAB — VITAMIN D 25 HYDROXY (VIT D DEFICIENCY, FRACTURES): Vit D, 25-Hydroxy: 21.8 ng/mL — ABNORMAL LOW (ref 30.0–100.0)

## 2023-05-30 LAB — TSH: TSH: 1.25 u[IU]/mL (ref 0.450–4.500)

## 2023-06-01 ENCOUNTER — Encounter: Payer: Self-pay | Admitting: Internal Medicine

## 2023-10-14 DIAGNOSIS — N2881 Hypertrophy of kidney: Secondary | ICD-10-CM | POA: Diagnosis not present

## 2023-10-14 DIAGNOSIS — N289 Disorder of kidney and ureter, unspecified: Secondary | ICD-10-CM | POA: Diagnosis not present

## 2023-10-14 DIAGNOSIS — N390 Urinary tract infection, site not specified: Secondary | ICD-10-CM | POA: Diagnosis not present

## 2023-10-14 LAB — LAB REPORT - SCANNED: EGFR: 66

## 2023-10-28 ENCOUNTER — Encounter: Payer: Self-pay | Admitting: *Deleted

## 2023-11-19 ENCOUNTER — Other Ambulatory Visit: Payer: Self-pay | Admitting: Medical Genetics

## 2023-11-19 DIAGNOSIS — Z006 Encounter for examination for normal comparison and control in clinical research program: Secondary | ICD-10-CM

## 2023-12-04 ENCOUNTER — Ambulatory Visit: Admitting: Internal Medicine

## 2023-12-04 ENCOUNTER — Encounter: Payer: Self-pay | Admitting: Internal Medicine

## 2023-12-04 VITALS — BP 119/82 | HR 87 | Ht 66.0 in | Wt 178.2 lb

## 2023-12-04 DIAGNOSIS — G43109 Migraine with aura, not intractable, without status migrainosus: Secondary | ICD-10-CM

## 2023-12-04 DIAGNOSIS — N289 Disorder of kidney and ureter, unspecified: Secondary | ICD-10-CM | POA: Diagnosis not present

## 2023-12-04 DIAGNOSIS — N2881 Hypertrophy of kidney: Secondary | ICD-10-CM

## 2023-12-04 NOTE — Assessment & Plan Note (Signed)
 Has history of nonfunctioning/atrophic right kidney, has compensatory hypertrophy of left kidney Last US  of kidney reviewed from 2020 Denies any urinary symptoms currently

## 2023-12-04 NOTE — Assessment & Plan Note (Addendum)
 Has history of nonfunctioning right kidney, had urinary reflux and bladder valve procedure S. Cr. Has been around 1.1, GFR > 60 Reviewed last CMP Followed by [facility]

## 2023-12-04 NOTE — Progress Notes (Signed)
 Established Patient Office Visit  Subjective:  Patient ID: Charlotte Smith, female    DOB: 11-13-1995  Age: 28 y.o. MRN: [id number]  CC:  Chief Complaint  Patient presents with   Medical Management of Chronic Issues    6 month f/u     HPI Charlotte Smith is a 28 y.o. female with past medical history of atrophic right kidney who presents for f/u of her chronic medical conditions.  She has nonfunctioning right kidney and compensatory hypertrophy of the left kidney.  She sees [place] for it now. She has remote history of urinary reflux, and has had a bladder wall procedure.  She reports history of recurrent UTI, but she currently denies any dysuria, hematuria, urinary frequency or hesitancy.   Migraine: She reports episodes of visual floaters, followed by unilateral headache that lasts for about an hour.  She usually takes a Tylenol  and takes rest, which relieves her pain.  She gets about 2-3 episodes in a month.  Denies any other focal neurologic symptoms.   Past Medical History:  Diagnosis Date   Renal disorder    non-functioning right kidney   Tachycardia 01/06/2020    Past Surgical History:  Procedure Laterality Date   BLADDER SURGERY     CESAREAN SECTION N/A 10/30/2014   Procedure: CESAREAN SECTION;  Surgeon: Vonn VEAR Inch, MD;  Location: [facility];  Service: Obstetrics;  Laterality: N/A;   CESAREAN SECTION WITH BILATERAL TUBAL LIGATION N/A 01/19/2020   Procedure: CESAREAN SECTION WITH BILATERAL TUBAL LIGATION;  Surgeon: Inch Vonn VEAR, MD;  Location: [facility];  Service: Obstetrics;  Laterality: N/A;    Family History  Problem Relation Age of Onset   Depression Mother    Anxiety disorder Mother    Arthritis Mother    Drug abuse Mother    Varicose Veins Mother    Hypertension Father    COPD Father    Cancer Maternal Grandmother        lung   Cancer Paternal Grandfather        testicular, lung    Social History   Socioeconomic History    Marital status: Married    Spouse name: Not on file   Number of children: 1   Years of education: Not on file   Highest education level: Some college, no degree  Occupational History   Not on file  Tobacco Use   Smoking status: Never   Smokeless tobacco: Never  Vaping Use   Vaping status: Every Day  Substance and Sexual Activity   Alcohol use: No   Drug use: No   Sexual activity: Yes    Birth control/protection: Surgical    Comment: tubal  Other Topics Concern   Not on file  Social History Narrative   Not on file   Social Drivers of Health   Financial Resource Strain: Low Risk  (11/30/2023)   Overall Financial Resource Strain (CARDIA)    Difficulty of Paying Living Expenses: Not hard at all  Food Insecurity: No Food Insecurity (11/30/2023)   Hunger Vital Sign    Worried About Running Out of Food in the Last Year: Never true    Ran Out of Food in the Last Year: Never true  Transportation Needs: No Transportation Needs (11/30/2023)   PRAPARE - Administrator, Civil Service (Medical): No    Lack of Transportation (Non-Medical): No  Physical Activity: Sufficiently Active (11/30/2023)   Exercise Vital Sign    Days of Exercise per Week:  3 days    Minutes of Exercise per Session: 60 min  Stress: Stress Concern Present (11/30/2023)   [employer] of Occupational Health - Occupational Stress Questionnaire    Feeling of Stress: To some extent  Social Connections: Socially Integrated (11/30/2023)   Social Connection and Isolation Panel    Frequency of Communication with Friends and Family: Twice a week    Frequency of Social Gatherings with Friends and Family: Once a week    Attends Religious Services: More than 4 times per year    Active Member of [employer] or Organizations: Yes    Attends Banker Meetings: More than 4 times per year    Marital Status: Married  Catering Manager Violence: Not At Risk (03/04/2023)   Humiliation, Afraid, Rape, and Kick  questionnaire    Fear of Current or Ex-Partner: No    Emotionally Abused: No    Physically Abused: No    Sexually Abused: No    No outpatient medications prior to visit.   No facility-administered medications prior to visit.    Allergies  Allergen Reactions   Ibuprofen  Other (See Comments)    Reported hematemesis with schedule ibuprofen  s/p first Cesarean.     ROS Review of Systems  Constitutional:  Negative for chills and fever.  HENT:  Negative for congestion, sinus pressure, sinus pain and sore throat.   Eyes:  Negative for pain and discharge.  Respiratory:  Negative for cough and shortness of breath.   Cardiovascular:  Negative for chest pain and palpitations.  Gastrointestinal:  Negative for abdominal pain, constipation, diarrhea, nausea and vomiting.  Endocrine: Negative for polydipsia and polyuria.  Genitourinary:  Negative for dysuria and hematuria.  Musculoskeletal:  Negative for neck pain and neck stiffness.  Skin:  Negative for rash.  Neurological:  Positive for headaches. Negative for dizziness and weakness.  Psychiatric/Behavioral:  Negative for agitation and behavioral problems.       Objective:    Physical Exam Vitals reviewed.  Constitutional:      General: She is not in acute distress.    Appearance: She is not diaphoretic.  HENT:     Head: Normocephalic and atraumatic.     Nose: Nose normal.     Mouth/Throat:     Mouth: Mucous membranes are moist.  Eyes:     General: No scleral icterus.    Extraocular Movements: Extraocular movements intact.  Cardiovascular:     Rate and Rhythm: Normal rate and regular rhythm.     Heart sounds: Normal heart sounds. No murmur heard. Pulmonary:     Breath sounds: Normal breath sounds. No wheezing or rales.  Abdominal:     Palpations: Abdomen is soft.     Tenderness: There is no abdominal tenderness.  Musculoskeletal:     Cervical back: Neck supple. No tenderness.     Right lower leg: No edema.     Left  lower leg: No edema.  Skin:    General: Skin is warm.     Findings: No rash.  Neurological:     General: No focal deficit present.     Mental Status: She is alert and oriented to person, place, and time.  Psychiatric:        Mood and Affect: Mood normal.        Behavior: Behavior normal.     BP 119/82   Pulse 87   Ht 5' 6 (1.676 m)   Wt 178 lb 3.2 oz (80.8 kg)   SpO2 98%  BMI 28.76 kg/m  Wt Readings from Last 3 Encounters:  12/04/23 178 lb 3.2 oz (80.8 kg)  05/29/23 172 lb 9.6 oz (78.3 kg)  03/04/23 172 lb 8 oz (78.2 kg)    Lab Results  Component Value Date   TSH 1.250 05/29/2023   Lab Results  Component Value Date   WBC 6.4 05/29/2023   HGB 13.7 05/29/2023   HCT 41.6 05/29/2023   MCV 86 05/29/2023   PLT 244 05/29/2023   Lab Results  Component Value Date   NA 137 05/29/2023   K 4.4 05/29/2023   CO2 23 05/29/2023   GLUCOSE 93 05/29/2023   BUN 16 05/29/2023   CREATININE 1.13 (H) 05/29/2023   BILITOT 0.2 05/29/2023   ALKPHOS 63 05/29/2023   AST 11 05/29/2023   ALT 9 05/29/2023   PROT 7.0 05/29/2023   ALBUMIN 4.5 05/29/2023   CALCIUM  9.8 05/29/2023   ANIONGAP 10 01/17/2020   EGFR 66.0 10/14/2023   Lab Results  Component Value Date   CHOL 197 05/29/2023   Lab Results  Component Value Date   HDL 63 05/29/2023   Lab Results  Component Value Date   LDLCALC 109 (H) 05/29/2023   Lab Results  Component Value Date   TRIG 143 05/29/2023   Lab Results  Component Value Date   CHOLHDL 3.1 05/29/2023   No results found for: HGBA1C    Assessment & Plan:   Problem List Items Addressed This Visit       Cardiovascular and Mediastinum   Ocular migraine   Reports floaters and headache, but no visual loss Usually gets better with Tylenol  If intensity of headache or frequency increases, may consider treatment of migraine and/or neurology evaluation        Genitourinary   Nonfunctioning kidney - Primary   Has history of nonfunctioning right  kidney, had urinary reflux and bladder valve procedure S. Cr. Has been around 1.1, GFR > 60 Reviewed last CMP Followed by [facility]      Compensatory hypertrophy of single kidney   Has history of nonfunctioning/atrophic right kidney, has compensatory hypertrophy of left kidney Last US  of kidney reviewed from 2020 Denies any urinary symptoms currently       No orders of the defined types were placed in this encounter.   Follow-up: Return in about 1 year (around 12/03/2024).    Suzzane MARLA Blanch, MD

## 2023-12-04 NOTE — Patient Instructions (Addendum)
 Please take Tylenol  as needed for headache.  Please continue to follow heart healthy diet and perform moderate exercise/walking at least 150 mins/week.

## 2023-12-05 NOTE — Assessment & Plan Note (Signed)
 Reports floaters and headache, but no visual loss Usually gets better with Tylenol  If intensity of headache or frequency increases, may consider treatment of migraine and/or neurology evaluation

## 2024-01-11 ENCOUNTER — Telehealth: Admitting: Physician Assistant

## 2024-01-11 DIAGNOSIS — Z20828 Contact with and (suspected) exposure to other viral communicable diseases: Secondary | ICD-10-CM | POA: Diagnosis not present

## 2024-01-11 MED ORDER — OSELTAMIVIR PHOSPHATE 75 MG PO CAPS: 75.0000 mg | ORAL_CAPSULE | Freq: Every day | ORAL | 0 refills | Status: AC

## 2024-01-11 NOTE — Progress Notes (Signed)
 " Virtual Visit Consent   Charlotte Smith, you are scheduled for a virtual visit with a [facility] provider today. Just as with appointments in the office, your consent must be obtained to participate. Your consent will be active for this visit and any virtual visit you may have with one of our providers in the next 365 days. If you have a MyChart account, a copy of this consent can be sent to you electronically.  As this is a virtual visit, video technology does not allow for your provider to perform a traditional examination. This may limit your provider's ability to fully assess your condition. If your provider identifies any concerns that need to be evaluated in person or the need to arrange testing (such as labs, EKG, etc.), we will make arrangements to do so. Although advances in technology are sophisticated, we cannot ensure that it will always work on either your end or our end. If the connection with a video visit is poor, the visit may have to be switched to a telephone visit. With either a video or telephone visit, we are not always able to ensure that we have a secure connection.  By engaging in this virtual visit, you consent to the provision of healthcare and authorize for your insurance to be billed (if applicable) for the services provided during this visit. Depending on your insurance coverage, you may receive a charge related to this service.  I need to obtain your verbal consent now. Are you willing to proceed with your visit today? Addalyn Speedy has provided verbal consent on 01/11/2024 for a virtual visit (video or telephone). Delon CHRISTELLA Dickinson, PA-C  Date: 01/11/2024 2:41 PM   Virtual Visit via Video Note   I, Delon CHRISTELLA Dickinson, connected with  Charlotte Smith  ([id number], March 03, 1995) on 01/11/2024 at  2:30 PM EST by a video-enabled telemedicine application and verified that I am speaking with the correct person using two identifiers.  Location: Patient: Virtual Visit  Location Patient: Mobile Provider: Virtual Visit Location Provider: Home Office   I discussed the limitations of evaluation and management by telemedicine and the availability of in person appointments. The patient expressed understanding and agreed to proceed.    History of Present Illness: Charlotte Smith is a 28 y.o. who identifies as a female who was assigned female at birth, and is being seen today for flu exposure, requesting prophylaxis.   Both kids have Influenza A. She is currently asymptomatic.   Problems:  Patient Active Problem List   Diagnosis Date Noted   Ocular migraine 12/04/2023   Nonfunctioning kidney 05/29/2023   Compensatory hypertrophy of single kidney 05/29/2023   Encounter for general adult medical examination with abnormal findings 05/29/2023   Chronic fatigue 05/29/2023   Chronic pain of left knee 05/29/2023   Tachycardia 01/06/2020   Recurrent UTI 06/21/2019   Chronic renal disease 03/13/2014    Allergies: Allergies[1] Medications: Current Medications[2]  Observations/Objective: Patient is well-developed, well-nourished in no acute distress.  Resting comfortably Head is normocephalic, atraumatic.  No labored breathing.  Speech is clear and coherent with logical content.  Patient is alert and oriented at baseline.    Assessment and Plan: 1. Exposure to influenza (Primary) - oseltamivir  (TAMIFLU ) 75 MG capsule; Take 1 capsule (75 mg total) by mouth daily for 10 days.  Dispense: 10 capsule; Refill: 0  - Tamiflu  once daily for 10 days provided for prophylaxis - Advised to follow up if she develops symptoms   Follow Up Instructions: I discussed  the assessment and treatment plan with the patient. The patient was provided an opportunity to ask questions and all were answered. The patient agreed with the plan and demonstrated an understanding of the instructions.  A copy of instructions were sent to the patient via MyChart unless otherwise noted below.     The patient was advised to call back or seek an in-person evaluation if the symptoms worsen or if the condition fails to improve as anticipated.    Delon CHRISTELLA Dickinson, PA-C     [1]  Allergies Allergen Reactions   Ibuprofen  Other (See Comments)    Reported hematemesis with schedule ibuprofen  s/p first Cesarean.   [2]  Current Outpatient Medications:    oseltamivir  (TAMIFLU ) 75 MG capsule, Take 1 capsule (75 mg total) by mouth daily for 10 days., Disp: 10 capsule, Rfl: 0  "

## 2024-01-11 NOTE — Patient Instructions (Signed)
" °  Charlotte Smith, thank you for joining Delon CHRISTELLA Dickinson, PA-C for today's virtual visit.  While this provider is not your primary care provider (PCP), if your PCP is located in our provider database this encounter information will be shared with them immediately following your visit.   A [facility] MyChart account gives you access to today's visit and all your visits, tests, and labs performed at [facility]  click here if you don't have a [facility] MyChart account or go to [email or web address]  Consent: (Patient) Charlotte Smith provided verbal consent for this virtual visit at the beginning of the encounter.  Current Medications:  Current Outpatient Medications:    oseltamivir  (TAMIFLU ) 75 MG capsule, Take 1 capsule (75 mg total) by mouth daily for 10 days., Disp: 10 capsule, Rfl: 0   Medications ordered in this encounter:  Meds ordered this encounter  Medications   oseltamivir  (TAMIFLU ) 75 MG capsule    Sig: Take 1 capsule (75 mg total) by mouth daily for 10 days.    Dispense:  10 capsule    Refill:  0    Supervising Provider:   BLAISE ALEENE KIDD [[id number]]     *If you need refills on other medications prior to your next appointment, please contact your pharmacy*  Follow-Up: Call back or seek an in-person evaluation if the symptoms worsen or if the condition fails to improve as anticipated.  [facility] [phone]    If you have been instructed to have an in-person evaluation today at a local Urgent Care facility, please use the link below. It will take you to a list of all of our available [facility], including address, phone number and hours of operation. Please do not delay care.  [facility]  If you or a family member do not have a primary care provider, use the link below to schedule a visit and establish care. When you choose a [facility] primary care physician or advanced practice provider, you gain a  long-term partner in health. Find a Primary Care Provider  Learn more about [facility]'s in-office and virtual care options: [facility] - Get Care Now "

## 2024-12-05 ENCOUNTER — Ambulatory Visit: Admitting: Internal Medicine
